# Patient Record
Sex: Male | Born: 1950
Health system: Southern US, Community
[De-identification: ages and names within clinical notes are randomized; demographics above are authoritative.]

## PROBLEM LIST (undated history)

## (undated) DIAGNOSIS — F411 Generalized anxiety disorder: Secondary | ICD-10-CM

## (undated) DIAGNOSIS — N400 Enlarged prostate without lower urinary tract symptoms: Secondary | ICD-10-CM

## (undated) DIAGNOSIS — B1710 Acute hepatitis C without hepatic coma: Secondary | ICD-10-CM

## (undated) DIAGNOSIS — I10 Essential (primary) hypertension: Secondary | ICD-10-CM

## (undated) DIAGNOSIS — F17200 Nicotine dependence, unspecified, uncomplicated: Secondary | ICD-10-CM

## (undated) HISTORY — PX: TONSILLECTOMY: SUR1361

## (undated) HISTORY — DX: Benign prostatic hyperplasia without lower urinary tract symptoms: N40.0

## (undated) HISTORY — DX: Generalized anxiety disorder: F41.1

## (undated) HISTORY — DX: Nicotine dependence, unspecified, uncomplicated: F17.200

## (undated) HISTORY — DX: Acute hepatitis C without hepatic coma: B17.10

## (undated) HISTORY — DX: Essential (primary) hypertension: I10

---

## 2002-12-03 ENCOUNTER — Encounter: Admission: RE | Admit: 2002-12-03 | Discharge: 2003-03-03 | Payer: Self-pay | Admitting: Internal Medicine

## 2005-05-20 ENCOUNTER — Ambulatory Visit: Payer: Self-pay | Admitting: Internal Medicine

## 2005-06-20 ENCOUNTER — Ambulatory Visit: Payer: Self-pay | Admitting: Cardiology

## 2005-08-18 ENCOUNTER — Emergency Department (HOSPITAL_COMMUNITY): Admission: EM | Admit: 2005-08-18 | Discharge: 2005-08-18 | Payer: Self-pay | Admitting: Family Medicine

## 2005-08-25 ENCOUNTER — Ambulatory Visit: Payer: Self-pay | Admitting: Internal Medicine

## 2005-08-26 ENCOUNTER — Encounter: Admission: RE | Admit: 2005-08-26 | Discharge: 2005-08-26 | Payer: Self-pay | Admitting: Internal Medicine

## 2005-09-06 ENCOUNTER — Ambulatory Visit: Payer: Self-pay | Admitting: Internal Medicine

## 2005-11-01 ENCOUNTER — Ambulatory Visit: Payer: Self-pay | Admitting: Family Medicine

## 2007-05-08 ENCOUNTER — Ambulatory Visit: Payer: Self-pay | Admitting: Internal Medicine

## 2007-05-10 DIAGNOSIS — I10 Essential (primary) hypertension: Secondary | ICD-10-CM

## 2007-05-10 DIAGNOSIS — B1920 Unspecified viral hepatitis C without hepatic coma: Secondary | ICD-10-CM

## 2007-05-10 DIAGNOSIS — B1710 Acute hepatitis C without hepatic coma: Secondary | ICD-10-CM

## 2007-05-10 HISTORY — DX: Acute hepatitis C without hepatic coma: B17.10

## 2007-06-08 ENCOUNTER — Ambulatory Visit: Payer: Self-pay | Admitting: Internal Medicine

## 2007-08-29 ENCOUNTER — Telehealth: Payer: Self-pay | Admitting: Internal Medicine

## 2007-09-07 ENCOUNTER — Ambulatory Visit: Payer: Self-pay | Admitting: Internal Medicine

## 2007-09-07 DIAGNOSIS — F17200 Nicotine dependence, unspecified, uncomplicated: Secondary | ICD-10-CM

## 2007-09-07 HISTORY — DX: Nicotine dependence, unspecified, uncomplicated: F17.200

## 2007-09-14 ENCOUNTER — Telehealth: Payer: Self-pay | Admitting: Internal Medicine

## 2008-07-17 ENCOUNTER — Ambulatory Visit: Payer: Self-pay | Admitting: Internal Medicine

## 2008-09-19 ENCOUNTER — Telehealth: Payer: Self-pay | Admitting: Internal Medicine

## 2009-05-07 ENCOUNTER — Telehealth: Payer: Self-pay | Admitting: Internal Medicine

## 2009-06-16 ENCOUNTER — Telehealth: Payer: Self-pay | Admitting: Internal Medicine

## 2009-06-24 ENCOUNTER — Ambulatory Visit: Payer: Self-pay | Admitting: Internal Medicine

## 2010-06-08 ENCOUNTER — Telehealth: Payer: Self-pay | Admitting: Internal Medicine

## 2010-09-08 ENCOUNTER — Telehealth: Payer: Self-pay | Admitting: Internal Medicine

## 2010-09-13 ENCOUNTER — Telehealth: Payer: Self-pay | Admitting: Internal Medicine

## 2010-12-01 ENCOUNTER — Telehealth: Payer: Self-pay | Admitting: Internal Medicine

## 2010-12-28 NOTE — Progress Notes (Signed)
  Phone Note Call from Patient Call back at Home Phone [phone]   Caller: Patient Call For: Birdie Sons MD Summary of Call: [facility])  Pt states he pulled muscles in back this am and needs Flexeril and Vicodin sent to the pharmacy.  He cannot get out of bed.   Initial call taken by: Lynann Beaver CMA,  September 08, 2010 2:38 PM  Follow-up for Phone Call        flexeril 10 mg by mouth three times a day as needed #20/0 hydrocodone 5/325 1 by mouth four times a day as needed pain #20/0 Follow-up by: Birdie Sons MD,  September 09, 2010 4:12 PM    Prescriptions: VICODIN 5-500 MG TABS (HYDROCODONE-ACETAMINOPHEN) one tab every 6 hours as needed for back pain  #10 x 0   Entered by:   Lynann Beaver CMA   Authorized by:   Birdie Sons MD   Signed by:   Lynann Beaver CMA on 09/09/2010   Method used:   Telephoned to ...       [facility] [address] [phone]* (retail)       [address]       [place], [place]  [address]       Ph: [phone]       Fax: [phone]   RxID:   [id number] FLEXERIL 10 MG TABS (CYCLOBENZAPRINE HCL) one tab every 8 hours as needed for back pain  #10 x 0   Entered by:   Lynann Beaver CMA   Authorized by:   Birdie Sons MD   Signed by:   Lynann Beaver CMA on 09/09/2010   Method used:   Telephoned to ...       [facility] [address] [phone]* (retail)       [address]       [place], [place]  [address]       Ph: [phone]       Fax: [phone]   RxID:   [id number]  Notified pt.  Appended Document:  Pharmacy called back about quantity of vicodin.

## 2010-12-28 NOTE — Progress Notes (Signed)
Summary: quantity needed  Phone Note Call from Patient Call back at Home Phone [phone]   Caller: Patient---live call Summary of Call: rx was called in today @ [facility] on [address]. needs quantity on pain med. Drugstore called already.  Rudy Jew, RN  September 13, 2010 3:14 PM  Initial call taken by: Warnell Forester,  September 13, 2010 1:49 PM

## 2010-12-28 NOTE — Progress Notes (Signed)
Summary: lisinopril refill  Phone Note Refill Request Message from:  Fax from Pharmacy on June 08, 2010 5:16 PM  Refills Requested: Medication #1:  LISINOPRIL-HYDROCHLOROTHIAZIDE 20-12.5 MG TABS 2 by mouth at the same time daily Initial call taken by: Kern Reap CMA Duncan Dull),  June 08, 2010 5:16 PM    Prescriptions: LISINOPRIL-HYDROCHLOROTHIAZIDE 20-12.5 MG TABS (LISINOPRIL-HYDROCHLOROTHIAZIDE) 2 by mouth at the same time daily  #180 x 3   Entered by:   Kern Reap CMA (AAMA)   Authorized by:   Birdie Sons MD   Signed by:   Kern Reap CMA (AAMA) on 06/08/2010   Method used:   Electronically to        [facility] [phone]* (retail)       [address]       [place], [place]  [address]       Ph: [phone]       Fax: [phone]   RxID:   [id number]

## 2010-12-30 NOTE — Progress Notes (Signed)
Summary: Pt req antibiotic for URI and Kidney Inf to [facility] [address]  Phone Note Call from Patient Call back at [facility] Phone [phone]   Caller: Patient Summary of Call: Pt called and has upper resp inf and kidney inf. Pt has congestion in chest and also has dark urine. Pt req generic antibiotics called in to [facility] [address]    Initial call taken by: Lucy Antigua,  December 01, 2010 10:34 AM  Follow-up for Phone Call        OV with any provider Follow-up by: Birdie Sons MD,  December 02, 2010 8:21 AM  Additional Follow-up for Phone Call Additional follow up Details #1::        pt prefers Urgent Care. Additional Follow-up by: Lynann Beaver CMA AAMA,  December 02, 2010 11:12 AM

## 2011-04-13 ENCOUNTER — Other Ambulatory Visit: Payer: Self-pay | Admitting: Internal Medicine

## 2011-04-18 ENCOUNTER — Other Ambulatory Visit: Payer: Self-pay | Admitting: Internal Medicine

## 2013-09-10 ENCOUNTER — Encounter: Payer: Self-pay | Admitting: Internal Medicine

## 2013-09-17 ENCOUNTER — Ambulatory Visit: Payer: Self-pay

## 2013-10-10 ENCOUNTER — Ambulatory Visit: Payer: No Typology Code available for payment source | Attending: Internal Medicine | Admitting: Internal Medicine

## 2013-10-10 VITALS — BP 191/116 | HR 66 | Temp 98.2°F | Resp 18 | Ht 71.0 in | Wt 185.8 lb

## 2013-10-10 DIAGNOSIS — I10 Essential (primary) hypertension: Secondary | ICD-10-CM

## 2013-10-10 DIAGNOSIS — M549 Dorsalgia, unspecified: Secondary | ICD-10-CM

## 2013-10-10 DIAGNOSIS — F411 Generalized anxiety disorder: Secondary | ICD-10-CM

## 2013-10-10 DIAGNOSIS — N400 Enlarged prostate without lower urinary tract symptoms: Secondary | ICD-10-CM

## 2013-10-10 DIAGNOSIS — R6882 Decreased libido: Secondary | ICD-10-CM | POA: Insufficient documentation

## 2013-10-10 DIAGNOSIS — G8929 Other chronic pain: Secondary | ICD-10-CM

## 2013-10-10 DIAGNOSIS — B1920 Unspecified viral hepatitis C without hepatic coma: Secondary | ICD-10-CM | POA: Insufficient documentation

## 2013-10-10 HISTORY — DX: Generalized anxiety disorder: F41.1

## 2013-10-10 HISTORY — DX: Benign prostatic hyperplasia without lower urinary tract symptoms: N40.0

## 2013-10-10 LAB — CBC
HCT: 44.4 % (ref 39.0–52.0)
Hemoglobin: 16.1 g/dL (ref 13.0–17.0)
MCH: 32.9 pg (ref 26.0–34.0)
MCHC: 36.3 g/dL — ABNORMAL HIGH (ref 30.0–36.0)
MCV: 90.8 fL (ref 78.0–100.0)
RDW: 12.8 % (ref 11.5–15.5)
WBC: 5.3 10*3/uL (ref 4.0–10.5)

## 2013-10-10 LAB — COMPLETE METABOLIC PANEL WITH GFR
ALT: 93 U/L — ABNORMAL HIGH (ref 0–53)
AST: 55 U/L — ABNORMAL HIGH (ref 0–37)
Albumin: 4.1 g/dL (ref 3.5–5.2)
Alkaline Phosphatase: 79 U/L (ref 39–117)
BUN: 18 mg/dL (ref 6–23)
Creat: 0.9 mg/dL (ref 0.50–1.35)
GFR, Est Non African American: >89 mL/min
Glucose, Bld: 114 mg/dL — ABNORMAL HIGH (ref 70–99)
Potassium: 3.7 mEq/L (ref 3.5–5.3)
Sodium: 137 mEq/L (ref 135–145)
Total Protein: 7.6 g/dL (ref 6.0–8.3)

## 2013-10-10 LAB — PSA: PSA: 0.32 ng/mL (ref <=4.00)

## 2013-10-10 LAB — TSH: TSH: 1.754 u[IU]/mL (ref 0.350–4.500)

## 2013-10-10 MED ORDER — SERTRALINE HCL 25 MG PO TABS: 25.0000 mg | ORAL_TABLET | Freq: Every day | ORAL | Status: DC

## 2013-10-10 MED ORDER — HYDRALAZINE HCL 50 MG PO TABS: 50.0000 mg | ORAL_TABLET | Freq: Three times a day (TID) | ORAL | Status: DC

## 2013-10-10 MED ORDER — TAMSULOSIN HCL 0.4 MG PO CAPS: 0.4000 mg | ORAL_CAPSULE | Freq: Every day | ORAL | Status: DC

## 2013-10-10 MED ORDER — CARVEDILOL 6.25 MG PO TABS: 6.2500 mg | ORAL_TABLET | Freq: Two times a day (BID) | ORAL | Status: DC

## 2013-10-10 NOTE — Progress Notes (Signed)
Patient here to establish care History of HTN has not been on his medicine since last year

## 2013-10-10 NOTE — Progress Notes (Signed)
Patient ID: James Avila, male   DOB: 27-May-1951, 62 y.o.   MRN: [id number]   Patient Demographics  [name], is a 62 y.o. male  CSN: [id number]  MRN: [id number]  DOB - 07-26-1951  Outpatient Primary MD for the patient is Judie Petit, MD   With History of -  Past Medical History  Diagnosis Date  . Hypertension   . TOBACCO USE 09/07/2007    Qualifier: Diagnosis of  By: Cato Mulligan MD, Bruce    . HEPATITIS C 05/10/2007    Qualifier: Diagnosis of  By: Cato Mulligan MD, Bruce    . BPH (benign prostatic hyperplasia) 10/10/2013  . Anxiety state, unspecified 10/10/2013      Past Surgical History  Procedure Laterality Date  . Tonsillectomy      in for   Chief Complaint  Patient presents with  . Annual Exam  . Hypertension     HPI  [name]  is a 62 y.o. male, with history of hypertension, hepatitis C, chronic low back pain, or back abuse, some generalized anxiety and intermittent basis comes here to establish care, he currently has no subjective complaints except low urinary stream, chronic low intermittent back pain without radiation to lower extremities, he does have some tingling and numbness from time to time in his feet, some reduction in his Libido over the last few years, denies any chest or abdominal pain, no shortness of breath, no dysuria, no shortness of breath. No focal weakness. He's not suicidal or homicidal. He takes care of his elderly mother at home and usually stays quite busy, he does agree to smoking 6-7 cigarettes per day however he does not indulge in regular alcohol intake.    Review of Systems    In addition to the HPI above,   No Fever-chills, No Headache, No changes with Vision or hearing, No problems swallowing food or Liquids, No Chest pain, Cough or Shortness of Breath, No Abdominal pain, No Nausea or Vommitting, Bowel movements are regular, No Blood in stool or Urine, but decreased urinary stream No dysuria, No new skin rashes or  bruises, No new joints pains-aches, chronic intermittent low back pain No new weakness, tingling, numbness in any extremity, except some intermittent tingling and numbness in his feet. No recent weight gain or loss, No polyuria, polydypsia or polyphagia, No significant Mental Stressors. But some generalized anxiety from time to time  A full 10 point Review of Systems was done, except as stated above, all other Review of Systems were negative.   Social History History  Substance Use Topics  . Smoking status: Light Tobacco Smoker    Types: Cigarettes  . Smokeless tobacco: Not on file  . Alcohol Use: Yes      Family History Hypertension in mother  Prior to Admission medications   Medication Sig Start Date End Date Taking? Authorizing Provider  aspirin 81 MG tablet Take 81 mg by mouth daily.   Yes Historical Provider, MD  carvedilol (COREG) 6.25 MG tablet Take 1 tablet (6.25 mg total) by mouth 2 (two) times daily with a meal. 10/10/13   Leroy Sea, MD  hydrALAZINE (APRESOLINE) 50 MG tablet Take 1 tablet (50 mg total) by mouth 3 (three) times daily. 10/10/13   Leroy Sea, MD  sertraline (ZOLOFT) 25 MG tablet Take 1 tablet (25 mg total) by mouth daily. 10/10/13   Leroy Sea, MD  tamsulosin (FLOMAX) 0.4 MG CAPS capsule Take 1 capsule (0.4 mg total) by mouth daily. 10/10/13  Leroy Sea, MD    No Known Allergies  Physical Exam  Vitals  Blood pressure 191/116, pulse 66, temperature 98.2 F (36.8 C), resp. rate 18, height 5\' 11"  (1.803 m), weight 185 lb 12.8 oz (84.278 kg), SpO2 100.00%.   1. General pleasant middle-aged Caucasian male sitting on clinic examination table in no apparent distress,     2. Normal affect and insight, Not Suicidal or Homicidal, Awake Alert, Oriented X 3.  3. No F.N deficits, ALL C.Nerves Intact, Strength 5/5 all 4 extremities, Sensation intact all 4 extremities, Plantars down going.  4. Ears and Eyes appear Normal, Conjunctivae  clear, PERRLA. Moist Oral Mucosa.  5. Supple Neck, No JVD, No cervical lymphadenopathy appriciated, No Carotid Bruits.  6. Symmetrical Chest wall movement, Good air movement bilaterally, CTAB.  7. RRR, No Gallops, Rubs or Murmurs, No Parasternal Heave.  8. Positive Bowel Sounds, Abdomen Soft, Non tender, No organomegaly appriciated,No rebound -guarding or rigidity.  9.  No Cyanosis, Normal Skin Turgor, No Skin Rash or Bruise.  10. Good muscle tone,  joints appear normal , no effusions, Normal ROM.  11. No Palpable Lymph Nodes in Neck or Axillae     Data Review  No results found for this basename: WBC,  HGB,  HCT,  MCV,  PLT      Chemistry   No results found for this basename: NA,  K,  CL,  CO2,  BUN,  CREATININE,  GLU   No results found for this basename: CALCIUM,  ALKPHOS,  AST,  ALT,  BILITOT       No results found for this basename: HGBA1C    No results found for this basename: CHOL,  HDL,  LDLCALC,  LDLDIRECT,  TRIG,  CHOLHDL    No results found for this basename: TSH    No results found for this basename: PSA         Assessment and plan   Hypertension. Started on Coreg along with hydralazine, come back in 2 months for repeat blood pressure.     History of hepatitis C. Stable no acute issues, check CMP referred to GI for routine followup and monitoring.    BPH. Start on Flomax, check PSA, urology referral     Generalized anxiety. Stable not suicidal homicidal, start on low-dose Zoloft.     Chronic low back pain stable no acute issues. Currently pain-free.     History of smoking. Counseled to quit     Some lower extremity paresthesias. We'll check A1c,TSH and B12 level.     Decreased Libido - will check free and total Testosterone.      Routine health maintenance.  Screening labs. CBC, CMP, TSH, A1c, PSA ordered    GI referral made for surveillance colonoscopy     Leroy Sea M.D on 10/10/2013 at 3:55 PM

## 2013-10-11 LAB — TESTOSTERONE, FREE, TOTAL, SHBG
Sex Hormone Binding: 74 nmol/L — ABNORMAL HIGH (ref 13–71)
Testosterone, Free: 39.8 pg/mL — ABNORMAL LOW (ref 47.0–244.0)
Testosterone-% Free: 1.1 % — ABNORMAL LOW (ref 1.6–2.9)

## 2013-10-11 LAB — VITAMIN B12: Vitamin B-12: 477 pg/mL (ref 211–911)

## 2013-10-14 ENCOUNTER — Telehealth: Payer: Self-pay | Admitting: Emergency Medicine

## 2013-10-14 NOTE — Telephone Encounter (Signed)
Left message for pt to call clinic

## 2013-10-14 NOTE — Addendum Note (Signed)
Addended by: Leroy Sea on: 10/14/2013 10:09 AM   Modules accepted: Orders

## 2013-10-14 NOTE — Progress Notes (Signed)
Quick Note:  Patient being referred to endocrine for possible low testosterone please let patient know ______

## 2013-10-14 NOTE — Telephone Encounter (Signed)
Message copied by Darlis Loan on Mon Oct 14, 2013  1:25 PM ------      Message from: [facility] [place] [facility]      Created: Mon Oct 14, 2013 10:10 AM       Patient being referred to endocrine for possible low testosterone please let patient know ------

## 2013-11-13 ENCOUNTER — Encounter: Payer: Self-pay | Admitting: Internal Medicine

## 2014-01-23 ENCOUNTER — Encounter: Payer: Self-pay | Admitting: *Deleted

## 2014-01-28 ENCOUNTER — Ambulatory Visit: Payer: No Typology Code available for payment source | Attending: Internal Medicine

## 2014-01-30 ENCOUNTER — Encounter: Payer: Self-pay | Admitting: *Deleted

## 2014-02-25 ENCOUNTER — Ambulatory Visit: Payer: No Typology Code available for payment source | Attending: Internal Medicine | Admitting: Internal Medicine

## 2014-02-25 ENCOUNTER — Encounter: Payer: Self-pay | Admitting: Internal Medicine

## 2014-02-25 VITALS — BP 170/100 | HR 71 | Temp 97.8°F | Resp 16 | Wt 186.0 lb

## 2014-02-25 DIAGNOSIS — I10 Essential (primary) hypertension: Secondary | ICD-10-CM | POA: Insufficient documentation

## 2014-02-25 DIAGNOSIS — Z79899 Other long term (current) drug therapy: Secondary | ICD-10-CM | POA: Insufficient documentation

## 2014-02-25 DIAGNOSIS — N400 Enlarged prostate without lower urinary tract symptoms: Secondary | ICD-10-CM

## 2014-02-25 DIAGNOSIS — F411 Generalized anxiety disorder: Secondary | ICD-10-CM | POA: Insufficient documentation

## 2014-02-25 DIAGNOSIS — R7989 Other specified abnormal findings of blood chemistry: Secondary | ICD-10-CM

## 2014-02-25 DIAGNOSIS — F17200 Nicotine dependence, unspecified, uncomplicated: Secondary | ICD-10-CM | POA: Insufficient documentation

## 2014-02-25 DIAGNOSIS — R030 Elevated blood-pressure reading, without diagnosis of hypertension: Secondary | ICD-10-CM

## 2014-02-25 DIAGNOSIS — Z8619 Personal history of other infectious and parasitic diseases: Secondary | ICD-10-CM

## 2014-02-25 DIAGNOSIS — Z7982 Long term (current) use of aspirin: Secondary | ICD-10-CM | POA: Insufficient documentation

## 2014-02-25 DIAGNOSIS — E291 Testicular hypofunction: Secondary | ICD-10-CM

## 2014-02-25 DIAGNOSIS — IMO0001 Reserved for inherently not codable concepts without codable children: Secondary | ICD-10-CM

## 2014-02-25 DIAGNOSIS — B1920 Unspecified viral hepatitis C without hepatic coma: Secondary | ICD-10-CM | POA: Insufficient documentation

## 2014-02-25 DIAGNOSIS — R109 Unspecified abdominal pain: Secondary | ICD-10-CM

## 2014-02-25 LAB — POCT URINALYSIS DIPSTICK
BILIRUBIN UA: NEGATIVE
GLUCOSE UA: NEGATIVE
Ketones, UA: NEGATIVE
Leukocytes, UA: NEGATIVE
Nitrite, UA: NEGATIVE
RBC UA: NEGATIVE
SPEC GRAV UA: 1.025
UROBILINOGEN UA: 1
pH, UA: 8

## 2014-02-25 MED ORDER — CARVEDILOL 6.25 MG PO TABS: 6.2500 mg | ORAL_TABLET | Freq: Two times a day (BID) | ORAL | Status: DC

## 2014-02-25 MED ORDER — HYDRALAZINE HCL 50 MG PO TABS: 50.0000 mg | ORAL_TABLET | Freq: Four times a day (QID) | ORAL | Status: DC

## 2014-02-25 MED ORDER — TAMSULOSIN HCL 0.4 MG PO CAPS: 0.4000 mg | ORAL_CAPSULE | Freq: Every day | ORAL | Status: DC

## 2014-02-25 MED ORDER — CLONIDINE HCL 0.1 MG PO TABS
0.2000 mg | ORAL_TABLET | Freq: Once | ORAL | Status: AC
  Administered 2014-02-25: 0.2 mg via ORAL

## 2014-02-25 MED ORDER — SERTRALINE HCL 25 MG PO TABS: 25.0000 mg | ORAL_TABLET | Freq: Every day | ORAL | Status: DC

## 2014-02-25 NOTE — Progress Notes (Signed)
MRN: [id number] Name: James Avila  Sex: male Age: 63 y.o. DOB: Jan 20, 1951  Allergies: Review of patient's allergies indicates no known allergies.  Chief Complaint  Patient presents with  . Follow-up    HPI: Patient is 63 y.o. male who has history of hypertension, BPH, anxiety, hep C as per patient he has never been treated for hep C today's blood pressure was elevated and was given clonidine his repeat blood pressure is 170/100 patient denies any headache dizziness chest and shortness of breath, patient does smoke cigarettes and is not ready to quit yet. Patient has been only taking Coreg was prescribed hydralazine in the past as well. As per patient he has not been taking Flomax recently but did have more frequent urination recently denies any dysuria.  Past Medical History  Diagnosis Date  . Hypertension   . TOBACCO USE 09/07/2007    Qualifier: Diagnosis of  By: Leanne Chang MD, Bruce    . HEPATITIS C 05/10/2007    Qualifier: Diagnosis of  By: Leanne Chang MD, Bruce    . BPH (benign prostatic hyperplasia) 10/10/2013  . Anxiety state, unspecified 10/10/2013    Past Surgical History  Procedure Laterality Date  . Tonsillectomy        Medication List       This list is accurate as of: 02/25/14  2:38 PM.  Always use your most recent med list.               aspirin 81 MG tablet  Take 81 mg by mouth daily.     carvedilol 6.25 MG tablet  Commonly known as:  COREG  Take 1 tablet (6.25 mg total) by mouth 2 (two) times daily with a meal.     hydrALAZINE 50 MG tablet  Commonly known as:  APRESOLINE  Take 1 tablet (50 mg total) by mouth 4 (four) times daily.     sertraline 25 MG tablet  Commonly known as:  ZOLOFT  Take 1 tablet (25 mg total) by mouth daily.     tamsulosin 0.4 MG Caps capsule  Commonly known as:  FLOMAX  Take 1 capsule (0.4 mg total) by mouth daily.        Meds ordered this encounter  Medications  . cloNIDine (CATAPRES) tablet 0.2 mg    Sig:   .  tamsulosin (FLOMAX) 0.4 MG CAPS capsule    Sig: Take 1 capsule (0.4 mg total) by mouth daily.    Dispense:  30 capsule    Refill:  3  . hydrALAZINE (APRESOLINE) 50 MG tablet    Sig: Take 1 tablet (50 mg total) by mouth 4 (four) times daily.    Dispense:  120 tablet    Refill:  3  . carvedilol (COREG) 6.25 MG tablet    Sig: Take 1 tablet (6.25 mg total) by mouth 2 (two) times daily with a meal.    Dispense:  60 tablet    Refill:  3  . sertraline (ZOLOFT) 25 MG tablet    Sig: Take 1 tablet (25 mg total) by mouth daily.    Dispense:  30 tablet    Refill:  3     There is no immunization history on file for this patient.  History reviewed. No pertinent family history.  History  Substance Use Topics  . Smoking status: Light Tobacco Smoker    Types: Cigarettes  . Smokeless tobacco: Not on file  . Alcohol Use: Yes    Review of Systems   As  noted in HPI  Filed Vitals:   02/25/14 1432  BP: 170/100  Pulse:   Temp:   Resp:     Physical Exam  Physical Exam  Constitutional: No distress.  Eyes: EOM are normal. Pupils are equal, round, and reactive to light.  Cardiovascular: Normal rate and regular rhythm.   Pulmonary/Chest: Breath sounds normal. No respiratory distress. He has no wheezes. He has no rales.  Musculoskeletal: He exhibits no edema.  Neurological:  Equal strength all extremities     CBC    Component Value Date/Time   WBC 5.3 10/10/2013 1551   RBC 4.89 10/10/2013 1551   HGB 16.1 10/10/2013 1551   HCT 44.4 10/10/2013 1551   PLT 186 10/10/2013 1551   MCV 90.8 10/10/2013 1551    CMP     Component Value Date/Time   NA 137 10/10/2013 1551   K 3.7 10/10/2013 1551   CL 104 10/10/2013 1551   CO2 25 10/10/2013 1551   GLUCOSE 114* 10/10/2013 1551   BUN 18 10/10/2013 1551   CREATININE 0.90 10/10/2013 1551   CALCIUM 9.0 10/10/2013 1551   PROT 7.6 10/10/2013 1551   ALBUMIN 4.1 10/10/2013 1551   AST 55* 10/10/2013 1551   ALT 93* 10/10/2013 1551    ALKPHOS 79 10/10/2013 1551   BILITOT 0.9 10/10/2013 1551   GFRNONAA >89 10/10/2013 1551   GFRAA >89 10/10/2013 1551    No results found for this basename: chol, tri, ldl    No components found with this basename: hga1c    Lab Results  Component Value Date/Time   AST 55* 10/10/2013  3:51 PM    Assessment and Plan  HTN (hypertension) - Plan: Advised patient for DASH diet, continue with Coreg her resume back on hydralazine hydrALAZINE (APRESOLINE) 50 MG tablet, carvedilol (COREG) 6.25 MG tablet  Flank pain - Plan: Urinalysis Dipstick Results for orders placed in visit on 02/25/14  POCT URINALYSIS DIPSTICK      Result Value Ref Range   Color, UA yellow     Clarity, UA clear     Glucose, UA neg     Bilirubin, UA neg     Ketones, UA neg     Spec Grav, UA 1.025     Blood, UA neg     pH, UA 8.0     Protein, UA trace     Urobilinogen, UA 1.0     Nitrite, UA neg     Leukocytes, UA Negative     Negative for infection, patient will resume back on Flomax  Elevated blood pressure - Plan: cloNIDine (CATAPRES) tablet 0.2 mg, EKG 12-Lead, nonspecific T wave changes patient denies chest pain. Needs better blood pressure control.  BPH (benign prostatic hyperplasia) - Plan: tamsulosin (FLOMAX) 0.4 MG CAPS capsule  TOBACCO USE Advised patient to quit smoking  History of hepatitis C - Plan: Ambulatory referral to Infectious Disease  Anxiety state, unspecified - Plan: sertraline (ZOLOFT) 25 MG tablet  Low testosterone - Plan: Ambulatory referral to Endocrinology   Return in about 3 months (around 05/27/2014) for hypertension, BP check in 2 weeks/Nurse Visit.  Lorayne Marek, MD

## 2014-02-25 NOTE — Progress Notes (Signed)
Patient here for follow up-htn Presents in office with elevated blood pressure Complains of sleeping more Flank back pain

## 2014-02-25 NOTE — Patient Instructions (Signed)
DASH Diet  The DASH diet stands for "Dietary Approaches to Stop Hypertension." It is a healthy eating plan that has been shown to reduce high blood pressure (hypertension) in as little as 14 days, while also possibly providing other significant health benefits. These other health benefits include reducing the risk of breast cancer after menopause and reducing the risk of type 2 diabetes, heart disease, colon cancer, and stroke. Health benefits also include weight loss and slowing kidney failure in patients with chronic kidney disease.   DIET GUIDELINES  · Limit salt (sodium). Your diet should contain less than 1500 mg of sodium daily.  · Limit refined or processed carbohydrates. Your diet should include mostly whole grains. Desserts and added sugars should be used sparingly.  · Include small amounts of heart-healthy fats. These types of fats include nuts, oils, and tub margarine. Limit saturated and trans fats. These fats have been shown to be harmful in the body.  CHOOSING FOODS   The following food groups are based on a 2000 calorie diet. See your Registered Dietitian for individual calorie needs.  Grains and Grain Products (6 to 8 servings daily)  · Eat More Often: Whole-wheat bread, brown rice, whole-grain or wheat pasta, quinoa, popcorn without added fat or salt (air popped).  · Eat Less Often: White bread, white pasta, white rice, cornbread.  Vegetables (4 to 5 servings daily)  · Eat More Often: Fresh, frozen, and canned vegetables. Vegetables may be raw, steamed, roasted, or grilled with a minimal amount of fat.  · Eat Less Often/Avoid: Creamed or fried vegetables. Vegetables in a cheese sauce.  Fruit (4 to 5 servings daily)  · Eat More Often: All fresh, canned (in natural juice), or frozen fruits. Dried fruits without added sugar. One hundred percent fruit juice (½ cup [237 mL] daily).  · Eat Less Often: Dried fruits with added sugar. Canned fruit in light or heavy syrup.  Lean Meats, Fish, and Poultry (2  servings or less daily. One serving is 3 to 4 oz [85-114 g]).  · Eat More Often: Ninety percent or leaner ground beef, tenderloin, sirloin. Round cuts of beef, chicken breast, turkey breast. All fish. Grill, bake, or broil your meat. Nothing should be fried.  · Eat Less Often/Avoid: Fatty cuts of meat, turkey, or chicken leg, thigh, or wing. Fried cuts of meat or fish.  Dairy (2 to 3 servings)  · Eat More Often: Low-fat or fat-free milk, low-fat plain or light yogurt, reduced-fat or part-skim cheese.  · Eat Less Often/Avoid: Milk (whole, 2%). Whole milk yogurt. Full-fat cheeses.  Nuts, Seeds, and Legumes (4 to 5 servings per week)  · Eat More Often: All without added salt.  · Eat Less Often/Avoid: Salted nuts and seeds, canned beans with added salt.  Fats and Sweets (limited)  · Eat More Often: Vegetable oils, tub margarines without trans fats, sugar-free gelatin. Mayonnaise and salad dressings.  · Eat Less Often/Avoid: Coconut oils, palm oils, butter, stick margarine, cream, half and half, cookies, candy, pie.  FOR MORE INFORMATION  The Dash Diet Eating Plan: www.dashdiet.org  Document Released: 11/03/2011 Document Revised: 02/06/2012 Document Reviewed: 11/03/2011  [employer]® Patient Information ©2014 [employer].

## 2014-02-28 ENCOUNTER — Ambulatory Visit: Payer: No Typology Code available for payment source | Attending: Internal Medicine

## 2014-03-07 ENCOUNTER — Ambulatory Visit: Payer: No Typology Code available for payment source | Attending: Internal Medicine | Admitting: Internal Medicine

## 2014-03-07 VITALS — BP 178/92 | HR 78 | Temp 98.6°F | Resp 16 | Ht 71.0 in | Wt 187.0 lb

## 2014-03-07 DIAGNOSIS — I10 Essential (primary) hypertension: Secondary | ICD-10-CM

## 2014-03-07 DIAGNOSIS — F17200 Nicotine dependence, unspecified, uncomplicated: Secondary | ICD-10-CM

## 2014-03-07 MED ORDER — CLONIDINE HCL 0.1 MG PO TABS
0.1000 mg | ORAL_TABLET | Freq: Once | ORAL | Status: AC
  Administered 2014-03-07: 0.1 mg via ORAL

## 2014-03-07 MED ORDER — HYDRALAZINE HCL 50 MG PO TABS: 100.0000 mg | ORAL_TABLET | Freq: Three times a day (TID) | ORAL | Status: DC

## 2014-03-07 MED ORDER — NAPROXEN SODIUM 220 MG PO TABS
220.0000 mg | ORAL_TABLET | Freq: Once | ORAL | Status: AC
  Administered 2014-03-07: 220 mg via ORAL

## 2014-03-07 NOTE — Progress Notes (Signed)
Pt is here because his BP has been running high w/ symptoms of high BP. Today BP 178/105

## 2014-03-07 NOTE — Patient Instructions (Signed)
Smoking Cessation Quitting smoking is important to your health and has many advantages. However, it is not always easy to quit since nicotine is a very addictive drug. Often times, people try 3 times or more before being able to quit. This document explains the best ways for you to prepare to quit smoking. Quitting takes hard work and a lot of effort, but you can do it. ADVANTAGES OF QUITTING SMOKING  You will live longer, feel better, and live better.  Your body will feel the impact of quitting smoking almost immediately.  Within 20 minutes, blood pressure decreases. Your pulse returns to its normal level.  After 8 hours, carbon monoxide levels in the blood return to normal. Your oxygen level increases.  After 24 hours, the chance of having a heart attack starts to decrease. Your breath, hair, and body stop smelling like smoke.  After 48 hours, damaged nerve endings begin to recover. Your sense of taste and smell improve.  After 72 hours, the body is virtually free of nicotine. Your bronchial tubes relax and breathing becomes easier.  After 2 to 12 weeks, lungs can hold more air. Exercise becomes easier and circulation improves.  The risk of having a heart attack, stroke, cancer, or lung disease is greatly reduced.  After 1 year, the risk of coronary heart disease is cut in half.  After 5 years, the risk of stroke falls to the same as a nonsmoker.  After 10 years, the risk of lung cancer is cut in half and the risk of other cancers decreases significantly.  After 15 years, the risk of coronary heart disease drops, usually to the level of a nonsmoker.  If you are pregnant, quitting smoking will improve your chances of having a healthy baby.  The people you live with, especially any children, will be healthier.  You will have extra money to spend on things other than cigarettes. QUESTIONS TO THINK ABOUT BEFORE ATTEMPTING TO QUIT You may want to talk about your answers with your  caregiver.  Why do you want to quit?  If you tried to quit in the past, what helped and what did not?  What will be the most difficult situations for you after you quit? How will you plan to handle them?  Who can help you through the tough times? Your family? Friends? A caregiver?  What pleasures do you get from smoking? What ways can you still get pleasure if you quit? Here are some questions to ask your caregiver:  How can you help me to be successful at quitting?  What medicine do you think would be best for me and how should I take it?  What should I do if I need more help?  What is smoking withdrawal like? How can I get information on withdrawal? GET READY  Set a quit date.  Change your environment by getting rid of all cigarettes, ashtrays, matches, and lighters in your home, car, or work. Do not let people smoke in your home.  Review your past attempts to quit. Think about what worked and what did not. GET SUPPORT AND ENCOURAGEMENT You have a better chance of being successful if you have help. You can get support in many ways.  Tell your family, friends, and co-workers that you are going to quit and need their support. Ask them not to smoke around you.  Get individual, group, or telephone counseling and support. Programs are available at local hospitals and health centers. Call your local health department for   information about programs in your area.  Spiritual beliefs and practices may help some smokers quit.  Download a "quit meter" on your computer to keep track of quit statistics, such as how long you have gone without smoking, cigarettes not smoked, and money saved.  Get a self-help book about quitting smoking and staying off of tobacco. LEARN NEW SKILLS AND BEHAVIORS  Distract yourself from urges to smoke. Talk to someone, go for a walk, or occupy your time with a task.  Change your normal routine. Take a different route to work. Drink tea instead of coffee.  Eat breakfast in a different place.  Reduce your stress. Take a hot bath, exercise, or read a book.  Plan something enjoyable to do every day. Reward yourself for not smoking.  Explore interactive web-based programs that specialize in helping you quit. GET MEDICINE AND USE IT CORRECTLY Medicines can help you stop smoking and decrease the urge to smoke. Combining medicine with the above behavioral methods and support can greatly increase your chances of successfully quitting smoking.  Nicotine replacement therapy helps deliver nicotine to your body without the negative effects and risks of smoking. Nicotine replacement therapy includes nicotine gum, lozenges, inhalers, nasal sprays, and skin patches. Some may be available over-the-counter and others require a prescription.  Antidepressant medicine helps people abstain from smoking, but how this works is unknown. This medicine is available by prescription.  Nicotinic receptor partial agonist medicine simulates the effect of nicotine in your brain. This medicine is available by prescription. Ask your caregiver for advice about which medicines to use and how to use them based on your health history. Your caregiver will tell you what side effects to look out for if you choose to be on a medicine or therapy. Carefully read the information on the package. Do not use any other product containing nicotine while using a nicotine replacement product.  RELAPSE OR DIFFICULT SITUATIONS Most relapses occur within the first 3 months after quitting. Do not be discouraged if you start smoking again. Remember, most people try several times before finally quitting. You may have symptoms of withdrawal because your body is used to nicotine. You may crave cigarettes, be irritable, feel very hungry, cough often, get headaches, or have difficulty concentrating. The withdrawal symptoms are only temporary. They are strongest when you first quit, but they will go away within  10 14 days. To reduce the chances of relapse, try to:  Avoid drinking alcohol. Drinking lowers your chances of successfully quitting.  Reduce the amount of caffeine you consume. Once you quit smoking, the amount of caffeine in your body increases and can give you symptoms, such as a rapid heartbeat, sweating, and anxiety.  Avoid smokers because they can make you want to smoke.  Do not let weight gain distract you. Many smokers will gain weight when they quit, usually less than 10 pounds. Eat a healthy diet and stay active. You can always lose the weight gained after you quit.  Find ways to improve your mood other than smoking. FOR MORE INFORMATION  www.smokefree.gov  Document Released: 11/08/2001 Document Revised: 05/15/2012 Document Reviewed: 02/23/2012 Exit[employer] Patient Information 2014 [employer]. Hypertension As your heart beats, it forces blood through your arteries. This force is your blood pressure. If the pressure is too high, it is called hypertension (HTN) or high blood pressure. HTN is dangerous because you may have it and not know it. High blood pressure may mean that your heart has to work harder to pump   blood. Your arteries may be narrow or stiff. The extra work puts you at risk for heart disease, stroke, and other problems.  Blood pressure consists of two numbers, a higher number over a lower, 110/72, for example. It is stated as "110 over 72." The ideal is below 120 for the top number (systolic) and under 80 for the bottom (diastolic). Write down your blood pressure today. You should pay close attention to your blood pressure if you have certain conditions such as:  Heart failure.  Prior heart attack.  Diabetes  Chronic kidney disease.  Prior stroke.  Multiple risk factors for heart disease. To see if you have HTN, your blood pressure should be measured while you are seated with your arm held at the level of the heart. It should be measured at least twice. A  one-time elevated blood pressure reading (especially in the Emergency Department) does not mean that you need treatment. There may be conditions in which the blood pressure is different between your right and left arms. It is important to see your caregiver soon for a recheck. Most people have essential hypertension which means that there is not a specific cause. This type of high blood pressure may be lowered by changing lifestyle factors such as:  Stress.  Smoking.  Lack of exercise.  Excessive weight.  Drug/tobacco/alcohol use.  Eating less salt. Most people do not have symptoms from high blood pressure until it has caused damage to the body. Effective treatment can often prevent, delay or reduce that damage. TREATMENT  When a cause has been identified, treatment for high blood pressure is directed at the cause. There are a large number of medications to treat HTN. These fall into several categories, and your caregiver will help you select the medicines that are best for you. Medications may have side effects. You should review side effects with your caregiver. If your blood pressure stays high after you have made lifestyle changes or started on medicines,   Your medication(s) may need to be changed.  Other problems may need to be addressed.  Be certain you understand your prescriptions, and know how and when to take your medicine.  Be sure to follow up with your caregiver within the time frame advised (usually within two weeks) to have your blood pressure rechecked and to review your medications.  If you are taking more than one medicine to lower your blood pressure, make sure you know how and at what times they should be taken. Taking two medicines at the same time can result in blood pressure that is too low. SEEK IMMEDIATE MEDICAL CARE IF:  You develop a severe headache, blurred or changing vision, or confusion.  You have unusual weakness or numbness, or a faint feeling.  You  have severe chest or abdominal pain, vomiting, or breathing problems. MAKE SURE YOU:   Understand these instructions.  Will watch your condition.  Will get help right away if you are not doing well or get worse. Document Released: 11/14/2005 Document Revised: 02/06/2012 Document Reviewed: 07/04/2008 ExitCare Patient Information 2014 [employer].  

## 2014-03-07 NOTE — Progress Notes (Signed)
Patient ID: James Avila, male   DOB: May 07, 1951, 63 y.o.   MRN: [id number]   James Avila, is a 63 y.o. male  FYB:[id number]  NID:[id number]  DOB - Dec 02, 1950  CC:  Follow up/BP check     HPI: James Avila is a 63 y.o. male that present today for a BP check.  Patient's BP upon arrival was 178/113.  Patient states yesterday he had a episode of dizziness, weakness, and diaphoresis while shopping.  Patient states that he sat down until the symptoms subsided and checked his BP which was 195/113.  Patient reports medication compliance. Patient denied chest pain,SOB, numbness/tingling, cough, or palpitations.  He does report a throbbing pain behind his eyes that he rates as a 8/10 in pain.   No Known Allergies Past Medical History  Diagnosis Date  . Hypertension   . TOBACCO USE 09/07/2007    Qualifier: Diagnosis of  By: Leanne Chang MD, Bruce    . HEPATITIS C 05/10/2007    Qualifier: Diagnosis of  By: Leanne Chang MD, Bruce    . BPH (benign prostatic hyperplasia) 10/10/2013  . Anxiety state, unspecified 10/10/2013   Current Outpatient Prescriptions on File Prior to Visit  Medication Sig Dispense Refill  . aspirin 81 MG tablet Take 81 mg by mouth daily.      . sertraline (ZOLOFT) 25 MG tablet Take 1 tablet (25 mg total) by mouth daily.  30 tablet  3  . tamsulosin (FLOMAX) 0.4 MG CAPS capsule Take 1 capsule (0.4 mg total) by mouth daily.  30 capsule  3  . carvedilol (COREG) 6.25 MG tablet Take 1 tablet (6.25 mg total) by mouth 2 (two) times daily with a meal.  60 tablet  3   No current facility-administered medications on file prior to visit.   History reviewed. No pertinent family history. History   Social History  . Marital Status: Married    Spouse Name: N/A    Number of Children: N/A  . Years of Education: N/A   Occupational History  . Not on file.   Social History Main Topics  . Smoking status: Light Tobacco Smoker    Types: Cigarettes  . Smokeless tobacco: Not on file  . Alcohol  Use: Yes  . Drug Use: Not on file  . Sexual Activity: Not on file   Other Topics Concern  . Not on file   Social History Narrative  . No narrative on file    Review of Systems: Constitutional: Negative for fever, chills, diaphoresis, activity change, appetite change and fatigue. HENT: Reports occasional nosebleeds. Negative for congestion, facial swelling, rhinorrhea, neck pain, neck stiffness. Eyes: Reports throbbing pain behind eyes. Negative for visual disturbance. Respiratory: Negative for cough, choking, chest tightness, shortness of breath, wheezing and stridor.  Cardiovascular: Negative for chest pain, palpitations and leg swelling. Gastrointestinal: Negative for abdominal distention.  Musculoskeletal: Negative for back pain, joint swelling, arthralgia and gait problem. Neurological: Negative for dizziness, tremors, seizures, syncope, facial asymmetry, speech difficulty, weakness, light-headedness, numbness and headaches.  Psychiatric/Behavioral: Negative for  confusion,decreased concentration and agitation.    Objective:   Filed Vitals:   03/07/14 1504  BP: 178/92  Pulse:   Temp:   Resp:     Physical Exam: Constitutional: Patient appears well-developed and well-nourished. No distress.  Eyes: Conjunctivae and EOM are normal. PERRLA, no scleral icterus. Neck: Normal ROM. Neck supple. No JVD. No tracheal deviation. No thyromegaly.  CVS: RRR, S1/S2 +, no murmurs, no gallops, no carotid bruit.  Pulmonary: Effort and  breath sounds normal, no stridor, rhonchi, wheezes, rales.  Abdominal: Soft. BS +, no distension, tenderness, rebound or guarding.  Musculoskeletal: Normal range of motion. No edema and no tenderness.  Skin: Skin is warm and dry. No rash noted. Not diaphoretic. No erythema. No pallor. Psychiatric: Normal mood and affect. Behavior, judgment, thought content normal.  Lab Results  Component Value Date   WBC 5.3 10/10/2013   HGB 16.1 10/10/2013   HCT 44.4  10/10/2013   MCV 90.8 10/10/2013   PLT 186 10/10/2013   Lab Results  Component Value Date   CREATININE 0.90 10/10/2013   BUN 18 10/10/2013   NA 137 10/10/2013   K 3.7 10/10/2013   CL 104 10/10/2013   CO2 25 10/10/2013    Lab Results  Component Value Date   HGBA1C 5.4 10/10/2013   Lipid Panel  No results found for this basename: chol, trig, hdl, cholhdl, vldl, ldlcalc       Assessment and plan:   1. HTN (hypertension)  - cloNIDine (CATAPRES) tablet 0.1 mg; Take 1 tablet (0.1 mg total) by mouth once. - naproxen sodium (ANAPROX) tablet 220 mg; Take 1 tablet (220 mg total) by mouth once. -  Changed to hydrALAZINE (APRESOLINE) 50 MG tablet; Take 2 tablets (100 mg total) by mouth 3 (three) times daily.  Dispense: 120 tablet; Refill: 3  2. TOBACCO USE Patient given information to quit line and resources for free nicotine patches.  Counseled to develop a plan to quit smoking and adhere to the plan.     Patient instructed to follow up in one week for a BP check.  The patient was given clear instructions to go to ER or return to medical center if symptoms don't improve, worsen or new problems develop. The patient verbalized understanding.

## 2014-03-10 ENCOUNTER — Ambulatory Visit: Payer: No Typology Code available for payment source | Attending: Internal Medicine

## 2014-03-10 ENCOUNTER — Emergency Department (HOSPITAL_COMMUNITY): Payer: No Typology Code available for payment source

## 2014-03-10 ENCOUNTER — Emergency Department (HOSPITAL_COMMUNITY)
Admission: EM | Admit: 2014-03-10 | Discharge: 2014-03-10 | Disposition: A | Discharge status: Home / Self Care | Payer: No Typology Code available for payment source | Attending: Emergency Medicine | Admitting: Emergency Medicine

## 2014-03-10 ENCOUNTER — Encounter (HOSPITAL_COMMUNITY): Payer: Self-pay | Admitting: Emergency Medicine

## 2014-03-10 VITALS — BP 194/106 | HR 97 | Resp 16

## 2014-03-10 DIAGNOSIS — R209 Unspecified disturbances of skin sensation: Secondary | ICD-10-CM | POA: Insufficient documentation

## 2014-03-10 DIAGNOSIS — Z8619 Personal history of other infectious and parasitic diseases: Secondary | ICD-10-CM | POA: Insufficient documentation

## 2014-03-10 DIAGNOSIS — R079 Chest pain, unspecified: Secondary | ICD-10-CM | POA: Insufficient documentation

## 2014-03-10 DIAGNOSIS — I10 Essential (primary) hypertension: Secondary | ICD-10-CM

## 2014-03-10 DIAGNOSIS — R630 Anorexia: Secondary | ICD-10-CM | POA: Insufficient documentation

## 2014-03-10 DIAGNOSIS — Z7982 Long term (current) use of aspirin: Secondary | ICD-10-CM | POA: Insufficient documentation

## 2014-03-10 DIAGNOSIS — R5381 Other malaise: Secondary | ICD-10-CM | POA: Insufficient documentation

## 2014-03-10 DIAGNOSIS — R5383 Other fatigue: Secondary | ICD-10-CM

## 2014-03-10 DIAGNOSIS — R202 Paresthesia of skin: Secondary | ICD-10-CM

## 2014-03-10 DIAGNOSIS — Z79899 Other long term (current) drug therapy: Secondary | ICD-10-CM | POA: Insufficient documentation

## 2014-03-10 DIAGNOSIS — Z87448 Personal history of other diseases of urinary system: Secondary | ICD-10-CM | POA: Insufficient documentation

## 2014-03-10 DIAGNOSIS — Z8659 Personal history of other mental and behavioral disorders: Secondary | ICD-10-CM | POA: Insufficient documentation

## 2014-03-10 DIAGNOSIS — F17200 Nicotine dependence, unspecified, uncomplicated: Secondary | ICD-10-CM | POA: Insufficient documentation

## 2014-03-10 LAB — BASIC METABOLIC PANEL
BUN: 20 mg/dL (ref 6–23)
CHLORIDE: 101 meq/L (ref 96–112)
CO2: 24 mEq/L (ref 19–32)
Calcium: 8.6 mg/dL (ref 8.4–10.5)
Creatinine, Ser: 0.83 mg/dL (ref 0.50–1.35)
GFR calc Af Amer: >90 mL/min (ref >90)
GFR calc non Af Amer: >90 mL/min (ref >90)
GLUCOSE: 108 mg/dL — AB (ref 70–99)
Potassium: 3.7 mEq/L (ref 3.7–5.3)
SODIUM: 138 meq/L (ref 137–147)

## 2014-03-10 LAB — CBC
HEMATOCRIT: 43.4 % (ref 39.0–52.0)
HEMOGLOBIN: 15.2 g/dL (ref 13.0–17.0)
MCH: 32.3 pg (ref 26.0–34.0)
MCHC: 35 g/dL (ref 30.0–36.0)
MCV: 92.1 fL (ref 78.0–100.0)
Platelets: 166 10*3/uL (ref 150–400)
RBC: 4.71 MIL/uL (ref 4.22–5.81)
RDW: 12.7 % (ref 11.5–15.5)
WBC: 4.8 10*3/uL (ref 4.0–10.5)

## 2014-03-10 LAB — I-STAT TROPONIN, ED
Troponin i, poc: 0 ng/mL (ref 0.00–0.08)
Troponin i, poc: 0.01 ng/mL (ref 0.00–0.08)

## 2014-03-10 LAB — PRO B NATRIURETIC PEPTIDE: Pro B Natriuretic peptide (BNP): 169.7 pg/mL — ABNORMAL HIGH (ref 0–125)

## 2014-03-10 MED ORDER — ASPIRIN 325 MG PO TABS
325.0000 mg | ORAL_TABLET | Freq: Once | ORAL | Status: AC
  Administered 2014-03-10: 325 mg via ORAL
  Filled 2014-03-10: qty 1

## 2014-03-10 MED ORDER — SODIUM CHLORIDE 0.9 % IV BOLUS (SEPSIS)
1000.0000 mL | Freq: Once | INTRAVENOUS | Status: AC
  Administered 2014-03-10: 1000 mL via INTRAVENOUS

## 2014-03-10 MED ORDER — CLONIDINE HCL 0.1 MG PO TABS
0.2000 mg | ORAL_TABLET | Freq: Once | ORAL | Status: AC
  Administered 2014-03-10: 0.2 mg via ORAL

## 2014-03-10 NOTE — ED Provider Notes (Signed)
CSN: [id number]     Arrival date & time 03/10/14  1516 History   First MD Initiated Contact with Patient 03/10/14 1540     Chief Complaint  Patient presents with  . Chest Pain  . Hypertension     (Consider location/radiation/quality/duration/timing/severity/associated sxs/prior Treatment) Patient is a 63 y.o. male presenting with chest pain and hypertension. The history is provided by the patient.  Chest Pain Pain location:  Substernal area Pain quality: sharp   Pain radiates to:  Does not radiate Pain radiates to the back: no   Pain severity:  Moderate Onset quality:  Unable to specify Duration: 2-3 years, weekly. Timing:  Intermittent Progression:  Unchanged Chronicity:  Chronic Context: at rest   Relieved by:  Nothing Worsened by:  Nothing tried Ineffective treatments:  None tried Associated symptoms: anorexia, fatigue, numbness and weakness   Associated symptoms: no abdominal pain, no back pain, no cough, no diaphoresis, no dizziness, no dysphagia, no fever, no headache, no lower extremity edema, no nausea, no shortness of breath and not vomiting   Fatigue:    Severity:  Moderate   Duration:  1 week   Timing:  Constant   Progression:  Worsening Weakness:    Severity:  Moderate   Onset quality:  Unable to specify   Duration:  1 week   Chronicity:  New   Timing:  Constant   Progression:  Worsening Risk factors: hypertension   Hypertension Associated symptoms include chest pain. Pertinent negatives include no abdominal pain, no headaches and no shortness of breath. Nothing aggravates the symptoms. Nothing relieves the symptoms. Treatments tried: coreg, hydralyzine, and PRN clonadine in PCP office. The treatment provided mild relief.    Past Medical History  Diagnosis Date  . Hypertension   . TOBACCO USE 09/07/2007    Qualifier: Diagnosis of  By: Leanne Chang MD, Bruce    . HEPATITIS C 05/10/2007    Qualifier: Diagnosis of  By: Leanne Chang MD, Bruce    . BPH (benign prostatic  hyperplasia) 10/10/2013  . Anxiety state, unspecified 10/10/2013   Past Surgical History  Procedure Laterality Date  . Tonsillectomy     History reviewed. No pertinent family history. History  Substance Use Topics  . Smoking status: Light Tobacco Smoker    Types: Cigarettes  . Smokeless tobacco: Not on file  . Alcohol Use: Yes    Review of Systems  Constitutional: Positive for fatigue. Negative for fever, diaphoresis, activity change and appetite change.  HENT: Negative for congestion, facial swelling, rhinorrhea and trouble swallowing.   Eyes: Negative for photophobia and pain.  Respiratory: Negative for cough, chest tightness and shortness of breath.   Cardiovascular: Positive for chest pain. Negative for leg swelling.  Gastrointestinal: Positive for anorexia. Negative for nausea, vomiting, abdominal pain, diarrhea and constipation.  Endocrine: Negative for polydipsia and polyuria.  Genitourinary: Negative for dysuria, urgency, decreased urine volume and difficulty urinating.  Musculoskeletal: Negative for back pain and gait problem.  Skin: Negative for color change, rash and wound.  Allergic/Immunologic: Negative for immunocompromised state.  Neurological: Positive for weakness and numbness. Negative for dizziness, facial asymmetry, speech difficulty and headaches.  Psychiatric/Behavioral: Negative for confusion, decreased concentration and agitation.      Allergies  Review of patient's allergies indicates no known allergies.  Home Medications   Current Outpatient Rx  Name  Route  Sig  Dispense  Refill  . aspirin 81 MG tablet   Oral   Take 81 mg by mouth daily.         Marland Kitchen  carvedilol (COREG) 6.25 MG tablet   Oral   Take 1 tablet (6.25 mg total) by mouth 2 (two) times daily with a meal.   60 tablet   3   . hydrALAZINE (APRESOLINE) 50 MG tablet   Oral   Take 2 tablets (100 mg total) by mouth 3 (three) times daily.   120 tablet   3    BP 130/77  Pulse 62   Temp(Src) 98.1 F (36.7 C) (Oral)  Resp 19  SpO2 94% Physical Exam  Constitutional: He is oriented to person, place, and time. He appears well-developed and well-nourished. No distress.  HENT:  Head: Normocephalic and atraumatic.  Mouth/Throat: No oropharyngeal exudate.  Eyes: Pupils are equal, round, and reactive to light.  Neck: Normal range of motion. Neck supple.  Cardiovascular: Normal rate, regular rhythm and normal heart sounds.  Exam reveals no gallop and no friction rub.   No murmur heard. Pulmonary/Chest: Effort normal and breath sounds normal. No respiratory distress. He has no wheezes. He has no rales.  Abdominal: Soft. Bowel sounds are normal. He exhibits no distension and no mass. There is no tenderness. There is no rebound and no guarding.  Musculoskeletal: Normal range of motion. He exhibits no edema and no tenderness.  Neurological: He is alert and oriented to person, place, and time. He has normal strength. He displays no tremor. A sensory deficit is present. He exhibits normal muscle tone. He displays a negative Romberg sign. Coordination and gait normal. GCS eye subscore is 4. GCS verbal subscore is 5. GCS motor subscore is 6.  Reports paresthesias to both lips and BL feet (entire)  Skin: Skin is warm and dry.  Psychiatric: He has a normal mood and affect.    ED Course  Procedures (including critical care time) Labs Review Labs Reviewed  BASIC METABOLIC PANEL - Abnormal; Notable for the following:    Glucose, Bld 108 (*)    All other components within normal limits  PRO B NATRIURETIC PEPTIDE - Abnormal; Notable for the following:    Pro B Natriuretic peptide (BNP) 169.7 (*)    All other components within normal limits  CBC  I-STAT TROPOININ, ED  Randolm Idol, ED   Imaging Review Dg Chest 2 View  03/10/2014   CLINICAL DATA:  Chest pain.  EXAM: CHEST  2 VIEW  COMPARISON:  None.  FINDINGS: The heart size and mediastinal contours are within normal limits.  Both lungs are clear. No pneumothorax or pleural effusion is noted. The visualized skeletal structures are unremarkable.  IMPRESSION: No acute cardiopulmonary abnormality seen.   Electronically Signed   By: Sabino Dick M.D.   On: 03/10/2014 16:57   Ct Head Wo Contrast  03/10/2014   CLINICAL DATA:  Facial and lip numbness.  History of hypertension.  EXAM: CT HEAD WITHOUT CONTRAST  TECHNIQUE: Contiguous axial images were obtained from the base of the skull through the vertex without intravenous contrast.  COMPARISON:  None.  FINDINGS: The brain demonstrates no evidence of hemorrhage, infarction, edema, mass effect, extra-axial fluid collection, hydrocephalus or mass lesion. The skull is unremarkable.  IMPRESSION: Normal head CT.   Electronically Signed   By: Aletta Edouard M.D.   On: 03/10/2014 17:19     EKG Interpretation   Date/Time:  Monday March 10 2014 15:16:44 EDT Ventricular Rate:  71 PR Interval:  130 QRS Duration: 87 QT Interval:  416 QTC Calculation: 452 R Axis:   20 Text Interpretation:  Sinus rhythm Anteroseptal infarct, age indeterminate  No prior for comparison Confirmed by DOCHERTY  MD, MEGAN [phone]) on  03/10/2014 3:48:56 PM      MDM   Final diagnoses:  Chest pain  Hypertension  Paresthesias    Pt is a 63 y.o. male with Pmhx as above who presents with intermittent CP (2-3 years), intermittent face/lip/feet numbness (2-3 years), and HTN from [place]. No CP currently, but does report lip numbness since about 5pm yesterday. SBP at office today 200. He has been on HTN meds about 2 week, doses increased 3 days ago. On PE, no objective neuro findings, though reports subjective dec sensation to both lips.  W/U shows no signs of end organ damage from HTN including nml CT head, nml delta trop, nml Cr.  Pt's reported paresthesias are not anatomical for CVA/TIA symptoms. Spoke w/ neurology who agrees he does not need further imaging in ED and can f/u as outpt. BP  normalized in ED w/o intervention. Will d/c home for him to f/u closely w/ PCP to continue titration of home BP meds. Return precautions given for new or worsening symptoms including worsening pain, change in neuro symptoms.          Neta Ehlers, MD 03/11/14 [phone]

## 2014-03-10 NOTE — ED Notes (Signed)
[facility] presents with a 62 yo male from [facility] with hypertension and had CP this am.  Upon [facility] arrival, BP was 200/p and 194/106 at [facility].  Pt states he has been experiencing CP off and on and hypertension (pt has a record written on paper).  He went to [facility] early because he felt like he wasn't going to be here for tomorrow's appointment if he didn't go today.  ECG unremarkable.  Hx. Of Intermittent facial numbness and foot numbness with CP, HTN and takes prescribed HTN meds.  Lip numbness today with lethargy.

## 2014-03-10 NOTE — Progress Notes (Unsigned)
   Subjective:    Patient ID: James Avila, male    DOB: 1951-08-10, 63 y.o.   MRN: [id number]  HPI  Pt comes in with c./o intermit Left chest wall pain radiating to right chest with facial numb/tingling since Saturday night. States he came out of store very dizzy.pt is taking bp meds for uncontrolled HTN BP today 194/106 97 Clonidine 0.2 mg given per protocol Stat EKG- NSR Pt rates pain 3/10 Report called to charge nurse [facility]   Review of Systems     Objective:   Physical Exam        Assessment & Plan:

## 2014-03-10 NOTE — Discharge Planning (Signed)
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 Patient expressed no concerns with pcp or getting medications at this time. Case management referral sent to [facility] case manager, will follow up.

## 2014-03-10 NOTE — Discharge Instructions (Signed)
Arterial Hypertension °Arterial hypertension (high blood pressure) is a condition of elevated pressure in your blood vessels. Hypertension over a long period of time is a risk factor for strokes, heart attacks, and heart failure. It is also the leading cause of kidney (renal) failure.  °CAUSES  °· In Adults -- Over 90% of all hypertension has no known cause. This is called essential or primary hypertension. In the other 10% of people with hypertension, the increase in blood pressure is caused by another disorder. This is called secondary hypertension. Important causes of secondary hypertension are: °· Heavy alcohol use. °· Obstructive sleep apnea. °· Hyperaldosterosim (Conn's syndrome). °· Steroid use. °· Chronic kidney failure. °· Hyperparathyroidism. °· Medications. °· Renal artery stenosis. °· Pheochromocytoma. °· Cushing's disease. °· Coarctation of the aorta. °· Scleroderma renal crisis. °· Licorice (in excessive amounts). °· Drugs (cocaine, methamphetamine). °Your caregiver can explain any items above that apply to you. °· In Children -- Secondary hypertension is more common and should always be considered. °· Pregnancy -- Few women of childbearing age have high blood pressure. However, up to 10% of them develop hypertension of pregnancy. Generally, this will not harm the woman. It may be a sign of 3 complications of pregnancy: preeclampsia, HELLP syndrome, and eclampsia. Follow up and control with medication is necessary. °SYMPTOMS  °· This condition normally does not produce any noticeable symptoms. It is usually found during a routine exam. °· Malignant hypertension is a late problem of high blood pressure. It may have the following symptoms: °· Headaches. °· Blurred vision. °· End-organ damage (this means your kidneys, heart, lungs, and other organs are being damaged). °· Stressful situations can increase the blood pressure. If a person with normal blood pressure has their blood pressure go up while being  seen by their caregiver, this is often termed "white coat hypertension." Its importance is not known. It may be related with eventually developing hypertension or complications of hypertension. °· Hypertension is often confused with mental tension, stress, and anxiety. °DIAGNOSIS  °The diagnosis is made by 3 separate blood pressure measurements. They are taken at least 1 week apart from each other. If there is organ damage from hypertension, the diagnosis may be made without repeat measurements. °Hypertension is usually identified by having blood pressure readings: °· Above 140/90 mmHg measured in both arms, at 3 separate times, over a couple weeks. °· Over 130/80 mmHg should be considered a risk factor and may require treatment in patients with diabetes. °Blood pressure readings over 120/80 mmHg are called "pre-hypertension" even in non-diabetic patients. °To get a true blood pressure measurement, use the following guidelines. Be aware of the factors that can alter blood pressure readings. °· Take measurements at least 1 hour after caffeine. °· Take measurements 30 minutes after smoking and without any stress. This is another reason to quit smoking  it raises your blood pressure. °· Use a proper cuff size. Ask your caregiver if you are not sure about your cuff size. °· Most home blood pressure cuffs are automatic. They will measure systolic and diastolic pressures. The systolic pressure is the pressure reading at the start of sounds. Diastolic pressure is the pressure at which the sounds disappear. If you are elderly, measure pressures in multiple postures. Try sitting, lying or standing. °· Sit at rest for a minimum of 5 minutes before taking measurements. °· You should not be on any medications like decongestants. These are found in many cold medications. °· Record your blood pressure readings and review   them with your caregiver. °If you have hypertension: °· Your caregiver may do tests to be sure you do not have  secondary hypertension (see "causes" above). °· Your caregiver may also look for signs of metabolic syndrome. This is also called Syndrome X or Insulin Resistance Syndrome. You may have this syndrome if you have type 2 diabetes, abdominal obesity, and abnormal blood lipids in addition to hypertension. °· Your caregiver will take your medical and family history and perform a physical exam. °· Diagnostic tests may include blood tests (for glucose, cholesterol, potassium, and kidney function), a urinalysis, or an EKG. Other tests may also be necessary depending on your condition. °PREVENTION  °There are important lifestyle issues that you can adopt to reduce your chance of developing hypertension: °· Maintain a normal weight. °· Limit the amount of salt (sodium) in your diet. °· Exercise often. °· Limit alcohol intake. °· Get enough potassium in your diet. Discuss specific advice with your caregiver. °· Follow a DASH diet (dietary approaches to stop hypertension). This diet is rich in fruits, vegetables, and low-fat dairy products, and avoids certain fats. °PROGNOSIS  °Essential hypertension cannot be cured. Lifestyle changes and medical treatment can lower blood pressure and reduce complications. The prognosis of secondary hypertension depends on the underlying cause. Many people whose hypertension is controlled with medicine or lifestyle changes can live a normal, healthy life.  °RISKS AND COMPLICATIONS  °While high blood pressure alone is not an illness, it often requires treatment due to its short- and long-term effects on many organs. Hypertension increases your risk for: °· CVAs or strokes (cerebrovascular accident). °· Heart failure due to chronically high blood pressure (hypertensive cardiomyopathy). °· Heart attack (myocardial infarction). °· Damage to the retina (hypertensive retinopathy). °· Kidney failure (hypertensive nephropathy). °Your caregiver can explain list items above that apply to you. Treatment  of hypertension can significantly reduce the risk of complications. °TREATMENT  °· For overweight patients, weight loss and regular exercise are recommended. Physical fitness lowers blood pressure. °· Mild hypertension is usually treated with diet and exercise. A diet rich in fruits and vegetables, fat-free dairy products, and foods low in fat and salt (sodium) can help lower blood pressure. Decreasing salt intake decreases blood pressure in a 1/3 of people. °· Stop smoking if you are a smoker. °The steps above are highly effective in reducing blood pressure. While these actions are easy to suggest, they are difficult to achieve. Most patients with moderate or severe hypertension end up requiring medications to bring their blood pressure down to a normal level. There are several classes of medications for treatment. Blood pressure pills (antihypertensives) will lower blood pressure by their different actions. Lowering the blood pressure by 10 mmHg may decrease the risk of complications by as much as 25%. °The goal of treatment is effective blood pressure control. This will reduce your risk for complications. Your caregiver will help you determine the best treatment for you according to your lifestyle. What is excellent treatment for one person, may not be for you. °HOME CARE INSTRUCTIONS  °· Do not smoke. °· Follow the lifestyle changes outlined in the "Prevention" section. °· If you are on medications, follow the directions carefully. Blood pressure medications must be taken as prescribed. Skipping doses reduces their benefit. It also puts you at risk for problems. °· Follow up with your caregiver, as directed. °· If you are asked to monitor your blood pressure at home, follow the guidelines in the "Diagnosis" section above. °SEEK MEDICAL CARE   IF:   You think you are having medication side effects.  You have recurrent headaches or lightheadedness.  You have swelling in your ankles.  You have trouble with  your vision. SEEK IMMEDIATE MEDICAL CARE IF:   You have sudden onset of chest pain or pressure, difficulty breathing, or other symptoms of a heart attack.  You have a severe headache.  You have symptoms of a stroke (such as sudden weakness, difficulty speaking, difficulty walking). MAKE SURE YOU:   Understand these instructions.  Will watch your condition.  Will get help right away if you are not doing well or get worse. Document Released: 11/14/2005 Document Revised: 02/06/2012 Document Reviewed: 06/14/2007 [facility] Patient Information 2014 [place]. Chest Pain (Nonspecific) It is often hard to give a specific diagnosis for the cause of chest pain. There is always a chance that your pain could be related to something serious, such as a heart attack or a blood clot in the lungs. You need to follow up with your caregiver for further evaluation. CAUSES   Heartburn.  Pneumonia or bronchitis.  Anxiety or stress.  Inflammation around your heart (pericarditis) or lung (pleuritis or pleurisy).  A blood clot in the lung.  A collapsed lung (pneumothorax). It can develop suddenly on its own (spontaneous pneumothorax) or from injury (trauma) to the chest.  Shingles infection (herpes zoster virus). The chest wall is composed of bones, muscles, and cartilage. Any of these can be the source of the pain.  The bones can be bruised by injury.  The muscles or cartilage can be strained by coughing or overwork.  The cartilage can be affected by inflammation and become sore (costochondritis). DIAGNOSIS  Lab tests or other studies, such as X-rays, electrocardiography, stress testing, or cardiac imaging, may be needed to find the cause of your pain.  TREATMENT   Treatment depends on what may be causing your chest pain. Treatment may include:  Acid blockers for heartburn.  Anti-inflammatory medicine.  Pain medicine for inflammatory conditions.  Antibiotics if an infection is  present.  You may be advised to change lifestyle habits. This includes stopping smoking and avoiding alcohol, caffeine, and chocolate.  You may be advised to keep your head raised (elevated) when sleeping. This reduces the chance of acid going backward from your stomach into your esophagus.  Most of the time, nonspecific chest pain will improve within 2 to 3 days with rest and mild pain medicine. HOME CARE INSTRUCTIONS   If antibiotics were prescribed, take your antibiotics as directed. Finish them even if you start to feel better.  For the next few days, avoid physical activities that bring on chest pain. Continue physical activities as directed.  Do not smoke.  Avoid drinking alcohol.  Only take over-the-counter or prescription medicine for pain, discomfort, or fever as directed by your caregiver.  Follow your caregiver's suggestions for further testing if your chest pain does not go away.  Keep any follow-up appointments you made. If you do not go to an appointment, you could develop lasting (chronic) problems with pain. If there is any problem keeping an appointment, you must call to reschedule. SEEK MEDICAL CARE IF:   You think you are having problems from the medicine you are taking. Read your medicine instructions carefully.  Your chest pain does not go away, even after treatment.  You develop a rash with blisters on your chest. SEEK IMMEDIATE MEDICAL CARE IF:   You have increased chest pain or pain that spreads to your arm,  neck, jaw, back, or abdomen.  You develop shortness of breath, an increasing cough, or you are coughing up blood.  You have severe back or abdominal pain, feel nauseous, or vomit.  You develop severe weakness, fainting, or chills.  You have a fever. THIS IS AN EMERGENCY. Do not wait to see if the pain will go away. Get medical help at once. Call your local emergency services (911 in [place] Do not drive yourself to the hospital. MAKE SURE YOU:    Understand these instructions.  Will watch your condition.  Will get help right away if you are not doing well or get worse. Document Released: 08/24/2005 Document Revised: 02/06/2012 Document Reviewed: 06/19/2008 [facility] Patient Information 2014 [place].  Paresthesia Paresthesia is an abnormal burning or prickling sensation. This sensation is generally felt in the hands, arms, legs, or feet. However, it may occur in any part of the body. It is usually not painful. The feeling may be described as:  Tingling or numbness.  "Pins and needles."  Skin crawling.  Buzzing.  Limbs "falling asleep."  Itching. Most people experience temporary (transient) paresthesia at some time in their lives. CAUSES  Paresthesia may occur when you breathe too quickly (hyperventilation). It can also occur without any apparent cause. Commonly, paresthesia occurs when pressure is placed on a nerve. The feeling quickly goes away once the pressure is removed. For some people, however, paresthesia is a long-lasting (chronic) condition caused by an underlying disorder. The underlying disorder may be:  A traumatic, direct injury to nerves. Examples include a:  Broken (fractured) neck.  Fractured skull.  A disorder affecting the brain and spinal cord (central nervous system). Examples include:  Transverse myelitis.  Encephalitis.  Transient ischemic attack.  Multiple sclerosis.  Stroke.  Tumor or blood vessel problems, such as an arteriovenous malformation pressing against the brain or spinal cord.  A condition that damages the peripheral nerves (peripheral neuropathy). Peripheral nerves are not part of the brain and spinal cord. These conditions include:  Diabetes.  Peripheral vascular disease.  Nerve entrapment syndromes, such as carpal tunnel syndrome.  Shingles.  Hypothyroidism.  Vitamin B12 deficiencies.  Alcoholism.  Heavy metal poisoning (lead, arsenic).  Rheumatoid  arthritis.  Systemic lupus erythematosus. DIAGNOSIS  Your caregiver will attempt to find the underlying cause of your paresthesia. Your caregiver may:  Take your medical history.  Perform a physical exam.  Order various lab tests.  Order imaging tests. TREATMENT  Treatment for paresthesia depends on the underlying cause. HOME CARE INSTRUCTIONS  Avoid drinking alcohol.  You may consider massage or acupuncture to help relieve your symptoms.  Keep all follow-up appointments as directed by your caregiver. SEEK IMMEDIATE MEDICAL CARE IF:   You feel weak.  You have trouble walking or moving.  You have problems with speech or vision.  You feel confused.  You cannot control your bladder or bowel movements.  You feel numbness after an injury.  You faint.  Your burning or prickling feeling gets worse when walking.  You have pain, cramps, or dizziness.  You develop a rash. MAKE SURE YOU:  Understand these instructions.  Will watch your condition.  Will get help right away if you are not doing well or get worse. Document Released: 11/04/2002 Document Revised: 02/06/2012 Document Reviewed: 08/05/2011 [facility] Patient Information 2014 [place].

## 2014-03-10 NOTE — ED Notes (Signed)
Pt ambulated to bathroom with no difficulty 

## 2014-03-12 ENCOUNTER — Encounter: Payer: Self-pay | Admitting: *Deleted

## 2014-03-13 ENCOUNTER — Encounter: Payer: Self-pay | Admitting: *Deleted

## 2014-03-17 ENCOUNTER — Other Ambulatory Visit (INDEPENDENT_AMBULATORY_CARE_PROVIDER_SITE_OTHER): Payer: No Typology Code available for payment source

## 2014-03-17 DIAGNOSIS — B182 Chronic viral hepatitis C: Secondary | ICD-10-CM

## 2014-03-18 LAB — HEPATITIS C RNA QUANTITATIVE
HCV QUANT: 542526 [IU]/mL — AB (ref <15)
HCV Quantitative Log: 5.73 {Log} — ABNORMAL HIGH (ref <1.18)

## 2014-03-24 LAB — HEPATITIS C GENOTYPE

## 2014-03-26 ENCOUNTER — Telehealth: Payer: Self-pay | Admitting: Internal Medicine

## 2014-03-26 ENCOUNTER — Other Ambulatory Visit: Payer: Self-pay | Admitting: *Deleted

## 2014-03-26 ENCOUNTER — Ambulatory Visit: Payer: No Typology Code available for payment source | Admitting: *Deleted

## 2014-03-26 VITALS — BP 184/95 | HR 70 | Temp 97.7°F | Resp 18

## 2014-03-26 DIAGNOSIS — I10 Essential (primary) hypertension: Secondary | ICD-10-CM

## 2014-03-26 MED ORDER — LISINOPRIL-HYDROCHLOROTHIAZIDE 10-12.5 MG PO TABS: 1.0000 | ORAL_TABLET | Freq: Every day | ORAL | Status: DC

## 2014-03-26 NOTE — Progress Notes (Unsigned)
Patient in today complaining of side effects from increase in Hydralazine.

## 2014-03-26 NOTE — Telephone Encounter (Signed)
Pt has come in today to say that he does not feel that his BP medication is working for him; please f/u with pt

## 2014-03-26 NOTE — Progress Notes (Signed)
Patient came in complaining of side effects of Hydralazine. Patient complains of numbness of face and lips, headaches, chills, sleeping a lot, little appetite, and panic attacks. Consulted with Roney Jaffe, NP. Valerie discontinued Hydralazine and placed patient on Lisinopril-HCTZ 10/12.5 mg daily. Patient given a nurse visit appointment for [id number] at 2:00 PM for blood pressure recheck. Patient verbalized understanding. Alverda Skeans, RN

## 2014-04-02 ENCOUNTER — Ambulatory Visit: Payer: No Typology Code available for payment source | Attending: Internal Medicine | Admitting: *Deleted

## 2014-04-02 VITALS — BP 114/74 | HR 67 | Temp 98.0°F | Resp 16

## 2014-04-02 DIAGNOSIS — I10 Essential (primary) hypertension: Secondary | ICD-10-CM

## 2014-04-02 MED ORDER — LISINOPRIL-HYDROCHLOROTHIAZIDE 20-25 MG PO TABS: 1.0000 | ORAL_TABLET | Freq: Every day | ORAL | Status: DC

## 2014-04-02 NOTE — Patient Instructions (Signed)
Your provider has increased your Lisinopril-HCTZ medication. Continue to keep a log and bring back with you on your next blood pressure recheck appointment. Keep your follow up appointment with your PCP. If you have any problems please call the clinic [phone].

## 2014-04-02 NOTE — Progress Notes (Signed)
Patient here today for blood pressure recheck. Patient states instead of taking Lisinopril-HCTZ 10-12.5 mg he has been taking 20-25 mg. Patient has kept of log of his blood pressure before and after taking medication. Consulted with Roney Jaffe, NP who has discontinued the original dose of Lisinopril-HCTZ 10-12.5 mg and prescribed Lisinopril-HCTZ 20-25 mg. Wants patient to come back in one week for blood pressure recheck.

## 2014-04-09 ENCOUNTER — Ambulatory Visit: Payer: No Typology Code available for payment source | Attending: Internal Medicine | Admitting: *Deleted

## 2014-04-09 ENCOUNTER — Other Ambulatory Visit: Payer: Self-pay | Admitting: Internal Medicine

## 2014-04-09 VITALS — BP 129/80 | HR 66 | Temp 98.1°F | Resp 14

## 2014-04-09 DIAGNOSIS — J069 Acute upper respiratory infection, unspecified: Secondary | ICD-10-CM

## 2014-04-09 MED ORDER — AZITHROMYCIN 250 MG PO TABS: ORAL_TABLET | ORAL | Status: DC

## 2014-04-09 NOTE — Patient Instructions (Signed)

## 2014-04-09 NOTE — Progress Notes (Signed)
Patient here for BP recheck. Patient also complaining of cough and running nose for two weeks. Lungs clear to auscultation. Patient states he is coughing up yellow sputum, having congestion, headaches. No tenderness noted on sinus areas. Consulted with Roney Jaffe, NP who prescribed a Z-pack. Alverda Skeans, RN

## 2014-04-30 ENCOUNTER — Encounter: Payer: Self-pay | Admitting: Internal Medicine

## 2014-04-30 ENCOUNTER — Ambulatory Visit (INDEPENDENT_AMBULATORY_CARE_PROVIDER_SITE_OTHER): Payer: No Typology Code available for payment source | Admitting: Internal Medicine

## 2014-04-30 VITALS — BP 164/96 | HR 64 | Temp 98.2°F | Ht 71.0 in | Wt 185.0 lb

## 2014-04-30 DIAGNOSIS — B1920 Unspecified viral hepatitis C without hepatic coma: Secondary | ICD-10-CM

## 2014-04-30 LAB — CBC WITH DIFFERENTIAL/PLATELET
BASOS ABS: 0.1 10*3/uL (ref 0.0–0.1)
Basophils Relative: 1 % (ref 0–1)
EOS PCT: 6 % — AB (ref 0–5)
Eosinophils Absolute: 0.3 10*3/uL (ref 0.0–0.7)
HEMATOCRIT: 42.5 % (ref 39.0–52.0)
Hemoglobin: 14.8 g/dL (ref 13.0–17.0)
Lymphocytes Relative: 48 % — ABNORMAL HIGH (ref 12–46)
Lymphs Abs: 2.6 10*3/uL (ref 0.7–4.0)
MCH: 31.4 pg (ref 26.0–34.0)
MCHC: 34.8 g/dL (ref 30.0–36.0)
MCV: 90 fL (ref 78.0–100.0)
MONO ABS: 0.6 10*3/uL (ref 0.1–1.0)
Monocytes Relative: 11 % (ref 3–12)
Neutro Abs: 1.8 10*3/uL (ref 1.7–7.7)
Neutrophils Relative %: 34 % — ABNORMAL LOW (ref 43–77)
Platelets: 191 10*3/uL (ref 150–400)
RBC: 4.72 MIL/uL (ref 4.22–5.81)
RDW: 12.8 % (ref 11.5–15.5)
WBC: 5.4 10*3/uL (ref 4.0–10.5)

## 2014-04-30 LAB — COMPLETE METABOLIC PANEL WITH GFR
ALK PHOS: 62 U/L (ref 39–117)
ALT: 78 U/L — ABNORMAL HIGH (ref 0–53)
AST: 46 U/L — ABNORMAL HIGH (ref 0–37)
Albumin: 4.2 g/dL (ref 3.5–5.2)
BUN: 15 mg/dL (ref 6–23)
CALCIUM: 9.1 mg/dL (ref 8.4–10.5)
CHLORIDE: 102 meq/L (ref 96–112)
CO2: 31 mEq/L (ref 19–32)
Creat: 0.98 mg/dL (ref 0.50–1.35)
GFR, EST NON AFRICAN AMERICAN: 83 mL/min
GFR, Est African American: >89 mL/min
GLUCOSE: 72 mg/dL (ref 70–99)
POTASSIUM: 4.5 meq/L (ref 3.5–5.3)
SODIUM: 138 meq/L (ref 135–145)
TOTAL PROTEIN: 7.5 g/dL (ref 6.0–8.3)
Total Bilirubin: 0.8 mg/dL (ref 0.2–1.2)

## 2014-04-30 LAB — IRON: Iron: 137 ug/dL (ref 42–165)

## 2014-04-30 NOTE — Progress Notes (Signed)
+  James Avila is a 63 y.o. male who presents for initial evaluation and management of a positive Hepatitis C antibody test.  Patient tested positive in 2003 after routine screen. Test was performed as part of an evaluation of routine screen. 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 Patient denies accidental needle stick, acupuncture, history of blood transfusion, intranasal drug use, multiple sexual partners, renal dialysis, sexual contact with person with liver disease, tattoos. Patient has had other studies performed. Results: hepatitis C RNA by PCR, result: positive. Patient has not had prior treatment for Hepatitis C. Patient does not have a past history of liver disease. Patient does not have a family history of liver disease.   HPI:   Patient does not have documented immunity to Hepatitis A. Patient does not have documented immunity to Hepatitis B.     Review of Systems A comprehensive review of systems was negative.   Past Medical History  Diagnosis Date  . Hypertension   . TOBACCO USE 09/07/2007    Qualifier: Diagnosis of  By: Leanne Chang MD, Bruce    . HEPATITIS C 05/10/2007    Qualifier: Diagnosis of  By: Leanne Chang MD, Bruce    . BPH (benign prostatic hyperplasia) 10/10/2013  . Anxiety state, unspecified 10/10/2013    History  Substance Use Topics  . Smoking status: Light Tobacco Smoker    Types: Cigarettes  . Smokeless tobacco: Not on file  . Alcohol Use: 1.2 oz/week    2 Cans of beer per week    No family history on file.    Objective:   Filed Vitals:   04/30/14 1404  BP: 164/96  Pulse: 64  Temp: 98.2 F (36.8 C)   in no apparent distress and well developed and well nourished HEENT: anicteric Cor RRR and No murmurs clear Bowel sounds are normal, liver is not enlarged, spleen is not enlarged peripheral pulses normal, no pedal edema, no clubbing or cyanosis negative for - jaundice, spider hemangioma, telangiectasia,  palmar erythema, ecchymosis and atrophy  Laboratory Genotype:  Lab Results  Component Value Date   HCVGENOTYPE 1a 03/17/2014   HCV viral load:  Lab Results  Component Value Date   HCVQUANT 938101* 03/17/2014   Lab Results  Component Value Date   WBC 4.8 03/10/2014   HGB 15.2 03/10/2014   HCT 43.4 03/10/2014   MCV 92.1 03/10/2014   PLT 166 03/10/2014    Lab Results  Component Value Date   CREATININE 0.83 03/10/2014   BUN 20 03/10/2014   NA 138 03/10/2014   K 3.7 03/10/2014   CL 101 03/10/2014   CO2 24 03/10/2014    Lab Results  Component Value Date   ALT 93* 10/10/2013   AST 55* 10/10/2013   ALKPHOS 79 10/10/2013   BILITOT 0.9 10/10/2013      Assessment: Hepatitis C genotype 1a  Plan: 1) Patient counseled extensively on limiting acetaminophen to no more than 2 grams daily, avoidance of alcohol. 2) Transmission discussed with patient including sexual transmission, sharing razors and toothbrush.   3) Will need referral to gastroenterology:No 4) Will need referral for substance abuse counseling: No 5) Will prescribe Harvoni for 12 weeks at next appt.  6) Follow up 2 weeks after elastography

## 2014-05-01 LAB — HEPATITIS A ANTIBODY, TOTAL: Hep A Total Ab: NONREACTIVE

## 2014-05-01 LAB — HIV ANTIBODY (ROUTINE TESTING W REFLEX): HIV 1&2 Ab, 4th Generation: NONREACTIVE

## 2014-05-01 LAB — HEPATITIS B SURFACE ANTIBODY,QUALITATIVE: Hep B S Ab: NEGATIVE

## 2014-05-01 LAB — HEPATITIS B CORE ANTIBODY, TOTAL: Hep B Core Total Ab: REACTIVE — AB

## 2014-05-01 LAB — ANA: Anti Nuclear Antibody(ANA): NEGATIVE

## 2014-05-01 LAB — HEPATITIS B SURFACE ANTIGEN: Hepatitis B Surface Ag: NEGATIVE

## 2014-05-01 LAB — PROTIME-INR
INR: 1.01 (ref <1.50)
Prothrombin Time: 13.2 seconds (ref 11.6–15.2)

## 2014-05-06 ENCOUNTER — Ambulatory Visit (HOSPITAL_COMMUNITY): Payer: No Typology Code available for payment source

## 2014-05-15 ENCOUNTER — Ambulatory Visit (HOSPITAL_COMMUNITY): Payer: No Typology Code available for payment source

## 2014-05-15 ENCOUNTER — Ambulatory Visit (HOSPITAL_COMMUNITY)
Admission: RE | Admit: 2014-05-15 | Discharge: 2014-05-15 | Disposition: A | Discharge status: Home / Self Care | Payer: No Typology Code available for payment source | Source: Ambulatory Visit | Attending: Internal Medicine | Admitting: Internal Medicine

## 2014-05-15 DIAGNOSIS — B1920 Unspecified viral hepatitis C without hepatic coma: Secondary | ICD-10-CM | POA: Insufficient documentation

## 2014-06-03 ENCOUNTER — Encounter: Payer: Self-pay | Admitting: Internal Medicine

## 2014-06-03 ENCOUNTER — Ambulatory Visit: Payer: No Typology Code available for payment source | Attending: Internal Medicine | Admitting: Internal Medicine

## 2014-06-03 VITALS — BP 138/93 | HR 69 | Temp 98.0°F | Resp 16 | Wt 182.6 lb

## 2014-06-03 DIAGNOSIS — B1920 Unspecified viral hepatitis C without hepatic coma: Secondary | ICD-10-CM | POA: Insufficient documentation

## 2014-06-03 DIAGNOSIS — F17200 Nicotine dependence, unspecified, uncomplicated: Secondary | ICD-10-CM

## 2014-06-03 DIAGNOSIS — Z8619 Personal history of other infectious and parasitic diseases: Secondary | ICD-10-CM

## 2014-06-03 DIAGNOSIS — I10 Essential (primary) hypertension: Secondary | ICD-10-CM

## 2014-06-03 NOTE — Progress Notes (Signed)
MRN: [id number] Name: James Avila  Sex: male Age: 63 y.o. DOB: Jul 04, 1951  Allergies: Hydralazine  Chief Complaint  Patient presents with  . Follow-up    HPI: Patient is 63 y.o. male who has history of hypertension, hepatitis C comes today for followup her, denies any headache dizziness chest and shortness of breath, has been compliant in taking his medication currently is taking lisinopril/hydrochlorothiazide, Coreg, patient is also following up with ID for hepatitis C. Patient is to smoke cigarettes, I have advised patient to quit smoking.  Past Medical History  Diagnosis Date  . Hypertension   . TOBACCO USE 09/07/2007    Qualifier: Diagnosis of  By: Leanne Chang MD, Bruce    . HEPATITIS C 05/10/2007    Qualifier: Diagnosis of  By: Leanne Chang MD, Bruce    . BPH (benign prostatic hyperplasia) 10/10/2013  . Anxiety state, unspecified 10/10/2013    Past Surgical History  Procedure Laterality Date  . Tonsillectomy        Medication List       This list is accurate as of: 06/03/14  3:29 PM.  Always use your most recent med list.               aspirin 81 MG tablet  Take 81 mg by mouth daily.     carvedilol 6.25 MG tablet  Commonly known as:  COREG  Take 1 tablet (6.25 mg total) by mouth 2 (two) times daily with a meal.     lisinopril-hydrochlorothiazide 20-25 MG per tablet  Commonly known as:  PRINZIDE,ZESTORETIC  Take 1 tablet by mouth daily.        No orders of the defined types were placed in this encounter.     There is no immunization history on file for this patient.  History reviewed. No pertinent family history.  History  Substance Use Topics  . Smoking status: Light Tobacco Smoker    Types: Cigarettes  . Smokeless tobacco: Not on file  . Alcohol Use: 1.2 oz/week    2 Cans of beer per week    Review of Systems   As noted in HPI  Filed Vitals:   06/03/14 1513  BP: 138/93  Pulse: 69  Temp: 98 F (36.7 C)  Resp: 16    Physical  Exam  Physical Exam  Constitutional: No distress.  Eyes: EOM are normal. Pupils are equal, round, and reactive to light. No scleral icterus.  Cardiovascular: Normal rate and regular rhythm.   Pulmonary/Chest: Breath sounds normal. No respiratory distress. He has no wheezes. He has no rales.    CBC    Component Value Date/Time   WBC 5.4 04/30/2014 1500   RBC 4.72 04/30/2014 1500   HGB 14.8 04/30/2014 1500   HCT 42.5 04/30/2014 1500   PLT 191 04/30/2014 1500   MCV 90.0 04/30/2014 1500   LYMPHSABS 2.6 04/30/2014 1500   MONOABS 0.6 04/30/2014 1500   EOSABS 0.3 04/30/2014 1500   BASOSABS 0.1 04/30/2014 1500    CMP     Component Value Date/Time   NA 138 04/30/2014 1500   K 4.5 04/30/2014 1500   CL 102 04/30/2014 1500   CO2 31 04/30/2014 1500   GLUCOSE 72 04/30/2014 1500   BUN 15 04/30/2014 1500   CREATININE 0.98 04/30/2014 1500   CREATININE 0.83 03/10/2014 1545   CALCIUM 9.1 04/30/2014 1500   PROT 7.5 04/30/2014 1500   ALBUMIN 4.2 04/30/2014 1500   AST 46* 04/30/2014 1500   ALT 78* 04/30/2014  1500   ALKPHOS 62 04/30/2014 1500   BILITOT 0.8 04/30/2014 1500   GFRNONAA 83 04/30/2014 1500   GFRNONAA >90 03/10/2014 1545   GFRAA >89 04/30/2014 1500   GFRAA >90 03/10/2014 1545    No results found for this basename: chol, tri, ldl    No components found with this basename: hga1c    Lab Results  Component Value Date/Time   AST 46* 04/30/2014  3:00 PM    Assessment and Plan  HYPERTENSION  blood pressure is improved compared to last visit, he will continue with his current medications also advise for DASH diet.  TOBACCO USE  advised patient to quit smoking. As per patient he has cut down compared to last time.  History of hepatitis C  following up with ID.   Return in about 3 months (around 09/03/2014) for hypertension.  Lorayne Marek, MD

## 2014-06-03 NOTE — Patient Instructions (Signed)
DASH Eating Plan  DASH stands for "Dietary Approaches to Stop Hypertension." The DASH eating plan is a healthy eating plan that has been shown to reduce high blood pressure (hypertension). Additional health benefits may include reducing the risk of type 2 diabetes mellitus, heart disease, and stroke. The DASH eating plan may also help with weight loss.  WHAT DO I NEED TO KNOW ABOUT THE DASH EATING PLAN?  For the DASH eating plan, you will follow these general guidelines:  · Choose foods with a percent daily value for sodium of less than 5% (as listed on the food label).  · Use salt-free seasonings or herbs instead of table salt or sea salt.  · Check with your health care provider or pharmacist before using salt substitutes.  · Eat lower-sodium products, often labeled as "lower sodium" or "no salt added."  · Eat fresh foods.  · Eat more vegetables, fruits, and low-fat dairy products.  · Choose whole grains. Look for the word "whole" as the first word in the ingredient list.  · Choose fish and skinless chicken or turkey more often than red meat. Limit fish, poultry, and meat to 6 oz (170 g) each day.  · Limit sweets, desserts, sugars, and sugary drinks.  · Choose heart-healthy fats.  · Limit cheese to 1 oz (28 g) per day.  · Eat more home-cooked food and less restaurant, buffet, and fast food.  · Limit fried foods.  · Cook foods using methods other than frying.  · Limit canned vegetables. If you do use them, rinse them well to decrease the sodium.  · When eating at a restaurant, ask that your food be prepared with less salt, or no salt if possible.  WHAT FOODS CAN I EAT?  Seek help from a dietitian for individual calorie needs.  Grains  Whole grain or whole wheat bread. Brown rice. Whole grain or whole wheat pasta. Quinoa, bulgur, and whole grain cereals. Low-sodium cereals. Corn or whole wheat flour tortillas. Whole grain cornbread. Whole grain crackers. Low-sodium crackers.  Vegetables  Fresh or frozen vegetables  (raw, steamed, roasted, or grilled). Low-sodium or reduced-sodium tomato and vegetable juices. Low-sodium or reduced-sodium tomato sauce and paste. Low-sodium or reduced-sodium canned vegetables.   Fruits  All fresh, canned (in natural juice), or frozen fruits.  Meat and Other Protein Products  Ground beef (85% or leaner), grass-fed beef, or beef trimmed of fat. Skinless chicken or turkey. Ground chicken or turkey. Pork trimmed of fat. All fish and seafood. Eggs. Dried beans, peas, or lentils. Unsalted nuts and seeds. Unsalted canned beans.  Dairy  Low-fat dairy products, such as skim or 1% milk, 2% or reduced-fat cheeses, low-fat ricotta or cottage cheese, or plain low-fat yogurt. Low-sodium or reduced-sodium cheeses.  Fats and Oils  Tub margarines without trans fats. Light or reduced-fat mayonnaise and salad dressings (reduced sodium). Avocado. Safflower, olive, or canola oils. Natural peanut or almond butter.  Other  Unsalted popcorn and pretzels.  The items listed above may not be a complete list of recommended foods or beverages. Contact your dietitian for more options.  WHAT FOODS ARE NOT RECOMMENDED?  Grains  White bread. White pasta. White rice. Refined cornbread. Bagels and croissants. Crackers that contain trans fat.  Vegetables  Creamed or fried vegetables. Vegetables in a cheese sauce. Regular canned vegetables. Regular canned tomato sauce and paste. Regular tomato and vegetable juices.  Fruits  Dried fruits. Canned fruit in light or heavy syrup. Fruit juice.  Meat and Other Protein   Products  Fatty cuts of meat. Ribs, chicken wings, bacon, sausage, bologna, salami, chitterlings, fatback, hot dogs, bratwurst, and packaged luncheon meats. Salted nuts and seeds. Canned beans with salt.  Dairy  Whole or 2% milk, cream, half-and-half, and cream cheese. Whole-fat or sweetened yogurt. Full-fat cheeses or blue cheese. Nondairy creamers and whipped toppings. Processed cheese, cheese spreads, or cheese  curds.  Condiments  Onion and garlic salt, seasoned salt, table salt, and sea salt. Canned and packaged gravies. Worcestershire sauce. Tartar sauce. Barbecue sauce. Teriyaki sauce. Soy sauce, including reduced sodium. Steak sauce. Fish sauce. Oyster sauce. Cocktail sauce. Horseradish. Ketchup and mustard. Meat flavorings and tenderizers. Bouillon cubes. Hot sauce. Tabasco sauce. Marinades. Taco seasonings. Relishes.  Fats and Oils  Butter, stick margarine, lard, shortening, ghee, and bacon fat. Coconut, palm kernel, or palm oils. Regular salad dressings.  Other  Pickles and olives. Salted popcorn and pretzels.  The items listed above may not be a complete list of foods and beverages to avoid. Contact your dietitian for more information.  WHERE CAN I FIND MORE INFORMATION?  National Heart, Lung, and Blood Institute: www.nhlbi.nih.gov/health/health-topics/topics/dash/  Document Released: 11/03/2011 Document Revised: 11/19/2013 Document Reviewed: 09/18/2013  ExitCare® Patient Information ©2015 [employer]. This information is not intended to replace advice given to you by your health care provider. Make sure you discuss any questions you have with your health care provider.

## 2014-06-03 NOTE — Progress Notes (Signed)
Patient here for follow up on his hypertension

## 2014-07-31 ENCOUNTER — Encounter: Payer: Self-pay | Admitting: Internal Medicine

## 2014-07-31 ENCOUNTER — Ambulatory Visit (INDEPENDENT_AMBULATORY_CARE_PROVIDER_SITE_OTHER): Payer: No Typology Code available for payment source | Admitting: Internal Medicine

## 2014-07-31 ENCOUNTER — Ambulatory Visit: Payer: No Typology Code available for payment source | Attending: Internal Medicine

## 2014-07-31 VITALS — BP 167/100 | HR 67 | Temp 98.0°F | Wt 174.0 lb

## 2014-07-31 DIAGNOSIS — B182 Chronic viral hepatitis C: Secondary | ICD-10-CM

## 2014-07-31 DIAGNOSIS — Z23 Encounter for immunization: Secondary | ICD-10-CM

## 2014-07-31 DIAGNOSIS — K7460 Unspecified cirrhosis of liver: Secondary | ICD-10-CM

## 2014-07-31 MED ORDER — LEDIPASVIR-SOFOSBUVIR 90-400 MG PO TABS: 1.0000 | ORAL_TABLET | Freq: Every day | ORAL | Status: DC

## 2014-07-31 NOTE — Addendum Note (Signed)
Addended by: Myrtis Hopping A on: 07/31/2014 05:24 PM   Modules accepted: Orders

## 2014-07-31 NOTE — Assessment & Plan Note (Signed)
Told to absolutely abstain from alcohol which he is doing.  Will need screening ultrasound every 6 months for [facility] screening.  GI if/when able.

## 2014-07-31 NOTE — Assessment & Plan Note (Signed)
Will send in Kerr and see if we can get it through pt assistance through [facility].

## 2014-07-31 NOTE — Patient Instructions (Signed)
Patient information: Cirrhosis (The Basics)Written by the doctors and editors at UpToDate  What is cirrhosis? - Cirrhosis is a disease that scars the liver. The liver is a big organ in the upper right side of the belly (figure 1). Damage to the liver can cause heavy bleeding, swelling, and breathing problems.  What are the symptoms of cirrhosis? - Some people with cirrhosis have no symptoms. When symptoms do occur, they can include: ?Swelling in the belly and legs, and fluid buildup in the lungs  ?Heavy bleeding from blood vessels in the esophagus, the tube that connects the mouth to the stomach  ?Bruising or bleeding easily ?Trouble breathing ?Feeling full  ?Feeling tired ?Trouble getting enough sleep or sleeping too much ?Yellowing of the skin or whites of the eyes, called jaundice ?Confusion that can come on suddenly ?Coma Cirrhosis also makes it more likely that you will get infections, and it can increase your risk of liver cancer.  What causes cirrhosis? - When something harms the liver, the organ tries to fix itself. In the process, scars form. Causes of liver damage include: ?Heavy alcohol use - People who abuse alcohol or who are addicted to it are most at risk for cirrhosis.  ?Hepatitis B or hepatitis C - Viruses cause these liver diseases. People can catch the viruses by sharing needles or having sex with people who are infected.  ?Nonalcoholic steatohepatitis (NASH) - People with this condition often don't drink alcohol. Doctors aren't sure what causes NASH, but many people who have it are obese and have diabetes. Is there a test for cirrhosis? - Yes. Tests include: ?Biopsy - In this test, a doctor puts a needle into your liver and takes out a small sample of tissue. The sample will show how severe the damage is.  ?Blood tests - Results can show what is causing the disease. ?Imaging - Your doctor might take pictures of your liver with an ultrasound machine or with a MRI. Is there  anything I can do to prevent further liver damage? - Yes. To help protect your liver: ?Avoid alcohol ?Talk to your doctor before you start taking any new medicines, including pain killers such as ibuprofen (sample brand names: Advil, Motrin), naproxen (sample brand name: Aleve), or acetaminophen (sample brand name: Tylenol). Also talk to your doctor before taking any herbs, vitamins, or supplements. Some medicines and supplements can damage the liver.  ?Get vaccinated against hepatitis A and B if you have not had the infections before How is cirrhosis treated? - Treatments depend on the cause of cirrhosis, how severe it is, and what symptoms you have. Treatments fall into a few main categories, including those that: ?Treat the cause of the disease - Some causes of cirrhosis can be treated. One example is stopping alcohol in people with cirrhosis caused by alcohol abuse. Another example is treatment of chronic hepatitis C or B with medicines.  ?Lower the risk of bleeding - Cirrhosis can cause the blood vessels around the esophagus to swell or even burst and bleed (figure 2). To prevent that from happening, doctors can: .Prescribe medicines called "beta blockers." These medicines reduce blood pressure in the liver, and help reduce the chance of bleeding. .Tie tiny bands around the swollen blood vessels (this procedure is called "variceal band ligation") ?Decrease fluid buildup in the belly - In people with cirrhosis, the belly sometimes fills with fluid. To decrease fluid buildup, doctors can:  .Prescribe medicines called "diuretics." These medicines help you pee out the extra  fluid. People who take diuretic medicines often must also reduce the amount of salt they eat. Niel Hummer the fluid from your belly using a needle (this procedure is called a "paracentesis") .Implant a device in the liver that reduces fluid buildup in the belly (this procedure is called "TIPS") ?Treat or prevent infection - People with  cirrhosis have a higher than normal chance of getting infections. When they get an infection, they can also get much sicker than people without cirrhosis. As a result, people with cirrhosis sometimes need antibiotics to either treat or prevent infection. Most people with cirrhosis should also get the flu vaccine and other vaccines to prevent common infections. ?Improve confusion - Advanced cirrhosis can lead to confusion. Doctors usually use lactulose (a medicine that softens stool) or certain antibiotics to treat the confusion.  Will I need a new liver? - People with severe cirrhosis need a new liver. Talk to your doctor about the surgery before you get too sick, to find out if a liver transplant might be an option for you. That way you can get on the waiting list early enough. People often have to wait for up to 2 years to get a new liver. Can cirrhosis be prevented? - You can reduce your chances of getting cirrhosis by: ?Getting help if you have an alcohol problem ?Getting the vaccines for hepatitis B and hepatitis A, if you haven't already ?Using condoms when having sex ?Not sharing drug needles

## 2014-07-31 NOTE — Progress Notes (Signed)
   Subjective:    Patient ID: James Avila, male    DOB: 12-May-1951, 63 y.o.   MRN: [id number]  HPI Here for follow up of HCV.  Genotype 1a, never treated, elastography with F4.  Stopped drinking.  Hep B immune.  Trying to quit smoking as well.     Review of Systems  Constitutional: Negative for fatigue.  Gastrointestinal: Negative for nausea and diarrhea.  Neurological: Negative for dizziness and light-headedness.       Objective:   Physical Exam        Assessment & Plan:

## 2014-09-15 ENCOUNTER — Ambulatory Visit: Payer: Self-pay | Attending: Internal Medicine | Admitting: Internal Medicine

## 2014-09-15 DIAGNOSIS — F17210 Nicotine dependence, cigarettes, uncomplicated: Secondary | ICD-10-CM | POA: Insufficient documentation

## 2014-09-15 DIAGNOSIS — I10 Essential (primary) hypertension: Secondary | ICD-10-CM | POA: Insufficient documentation

## 2014-09-15 DIAGNOSIS — R200 Anesthesia of skin: Secondary | ICD-10-CM | POA: Insufficient documentation

## 2014-09-15 NOTE — Progress Notes (Signed)
Patient presents for 6 month history numbness in lower lip and chin, elevated BP, headaches, and "pressure" in neck Also c/o "throbbing" chest pain lasting the entire day 1-2 days each week for 6 months. Denies chest pain at present; states last episode of chest pain was 4 days ago lasting entire day; rated 6/10 States down to smoking 5 cigs per day  BP 146/80 left arm manually P 74 T 98.3 oral SPO2 98%  Patient appears in no apparent distress. Appt with PCP scheduled for tomorrow, 09/16/14 at 5 pm Patient instructed to continue taking BP as previously directed  Agree with RN's assessment and intervention  Lorayne Marek, MD

## 2014-09-16 ENCOUNTER — Ambulatory Visit: Payer: Self-pay | Attending: Internal Medicine | Admitting: Internal Medicine

## 2014-09-16 ENCOUNTER — Encounter: Payer: Self-pay | Admitting: Internal Medicine

## 2014-09-16 ENCOUNTER — Ambulatory Visit (HOSPITAL_COMMUNITY)
Admission: RE | Admit: 2014-09-16 | Discharge: 2014-09-16 | Disposition: A | Discharge status: Home / Self Care | Payer: Self-pay | Source: Ambulatory Visit | Attending: Internal Medicine | Admitting: Internal Medicine

## 2014-09-16 ENCOUNTER — Other Ambulatory Visit: Payer: Self-pay

## 2014-09-16 ENCOUNTER — Ambulatory Visit: Payer: Self-pay | Attending: Internal Medicine

## 2014-09-16 VITALS — BP 160/90 | HR 79 | Temp 98.0°F | Resp 16 | Wt 180.2 lb

## 2014-09-16 DIAGNOSIS — Z8249 Family history of ischemic heart disease and other diseases of the circulatory system: Secondary | ICD-10-CM

## 2014-09-16 DIAGNOSIS — F17200 Nicotine dependence, unspecified, uncomplicated: Secondary | ICD-10-CM

## 2014-09-16 DIAGNOSIS — I10 Essential (primary) hypertension: Secondary | ICD-10-CM

## 2014-09-16 DIAGNOSIS — Z7982 Long term (current) use of aspirin: Secondary | ICD-10-CM | POA: Insufficient documentation

## 2014-09-16 DIAGNOSIS — IMO0001 Reserved for inherently not codable concepts without codable children: Secondary | ICD-10-CM

## 2014-09-16 DIAGNOSIS — Z720 Tobacco use: Secondary | ICD-10-CM | POA: Insufficient documentation

## 2014-09-16 DIAGNOSIS — R079 Chest pain, unspecified: Secondary | ICD-10-CM

## 2014-09-16 DIAGNOSIS — F419 Anxiety disorder, unspecified: Secondary | ICD-10-CM | POA: Insufficient documentation

## 2014-09-16 DIAGNOSIS — R0789 Other chest pain: Secondary | ICD-10-CM

## 2014-09-16 DIAGNOSIS — B1920 Unspecified viral hepatitis C without hepatic coma: Secondary | ICD-10-CM | POA: Insufficient documentation

## 2014-09-16 DIAGNOSIS — R030 Elevated blood-pressure reading, without diagnosis of hypertension: Secondary | ICD-10-CM

## 2014-09-16 MED ORDER — LISINOPRIL-HYDROCHLOROTHIAZIDE 20-25 MG PO TABS: 2.0000 | ORAL_TABLET | Freq: Every day | ORAL | Status: DC

## 2014-09-16 MED ORDER — CLONIDINE HCL 0.1 MG PO TABS
0.1000 mg | ORAL_TABLET | Freq: Once | ORAL | Status: AC
  Administered 2014-09-16: 0.1 mg via ORAL

## 2014-09-16 NOTE — Patient Instructions (Signed)
DASH Eating Plan °DASH stands for "Dietary Approaches to Stop Hypertension." The DASH eating plan is a healthy eating plan that has been shown to reduce high blood pressure (hypertension). Additional health benefits may include reducing the risk of type 2 diabetes mellitus, heart disease, and stroke. The DASH eating plan may also help with weight loss. °WHAT DO I NEED TO KNOW ABOUT THE DASH EATING PLAN? °For the DASH eating plan, you will follow these general guidelines: °· Choose foods with a percent daily value for sodium of less than 5% (as listed on the food label). °· Use salt-free seasonings or herbs instead of table salt or sea salt. °· Check with your health care provider or pharmacist before using salt substitutes. °· Eat lower-sodium products, often labeled as "lower sodium" or "no salt added." °· Eat fresh foods. °· Eat more vegetables, fruits, and low-fat dairy products. °· Choose whole grains. Look for the word "whole" as the first word in the ingredient list. °· Choose fish and skinless chicken or turkey more often than red meat. Limit fish, poultry, and meat to 6 oz (170 g) each day. °· Limit sweets, desserts, sugars, and sugary drinks. °· Choose heart-healthy fats. °· Limit cheese to 1 oz (28 g) per day. °· Eat more home-cooked food and less restaurant, buffet, and fast food. °· Limit fried foods. °· Cook foods using methods other than frying. °· Limit canned vegetables. If you do use them, rinse them well to decrease the sodium. °· When eating at a restaurant, ask that your food be prepared with less salt, or no salt if possible. °WHAT FOODS CAN I EAT? °Seek help from a dietitian for individual calorie needs. °Grains °Whole grain or whole wheat bread. Brown rice. Whole grain or whole wheat pasta. Quinoa, bulgur, and whole grain cereals. Low-sodium cereals. Corn or whole wheat flour tortillas. Whole grain cornbread. Whole grain crackers. Low-sodium crackers. °Vegetables °Fresh or frozen vegetables  (raw, steamed, roasted, or grilled). Low-sodium or reduced-sodium tomato and vegetable juices. Low-sodium or reduced-sodium tomato sauce and paste. Low-sodium or reduced-sodium canned vegetables.  °Fruits °All fresh, canned (in natural juice), or frozen fruits. °Meat and Other Protein Products °Ground beef (85% or leaner), grass-fed beef, or beef trimmed of fat. Skinless chicken or turkey. Ground chicken or turkey. Pork trimmed of fat. All fish and seafood. Eggs. Dried beans, peas, or lentils. Unsalted nuts and seeds. Unsalted canned beans. °Dairy °Low-fat dairy products, such as skim or 1% milk, 2% or reduced-fat cheeses, low-fat ricotta or cottage cheese, or plain low-fat yogurt. Low-sodium or reduced-sodium cheeses. °Fats and Oils °Tub margarines without trans fats. Light or reduced-fat mayonnaise and salad dressings (reduced sodium). Avocado. Safflower, olive, or canola oils. Natural peanut or almond butter. °Other °Unsalted popcorn and pretzels. °The items listed above may not be a complete list of recommended foods or beverages. Contact your dietitian for more options. °WHAT FOODS ARE NOT RECOMMENDED? °Grains °White bread. White pasta. White rice. Refined cornbread. Bagels and croissants. Crackers that contain trans fat. °Vegetables °Creamed or fried vegetables. Vegetables in a cheese sauce. Regular canned vegetables. Regular canned tomato sauce and paste. Regular tomato and vegetable juices. °Fruits °Dried fruits. Canned fruit in light or heavy syrup. Fruit juice. °Meat and Other Protein Products °Fatty cuts of meat. Ribs, chicken wings, bacon, sausage, bologna, salami, chitterlings, fatback, hot dogs, bratwurst, and packaged luncheon meats. Salted nuts and seeds. Canned beans with salt. °Dairy °Whole or 2% milk, cream, half-and-half, and cream cheese. Whole-fat or sweetened yogurt. Full-fat   cheeses or blue cheese. Nondairy creamers and whipped toppings. Processed cheese, cheese spreads, or cheese  curds. °Condiments °Onion and garlic salt, seasoned salt, table salt, and sea salt. Canned and packaged gravies. Worcestershire sauce. Tartar sauce. Barbecue sauce. Teriyaki sauce. Soy sauce, including reduced sodium. Steak sauce. Fish sauce. Oyster sauce. Cocktail sauce. Horseradish. Ketchup and mustard. Meat flavorings and tenderizers. Bouillon cubes. Hot sauce. Tabasco sauce. Marinades. Taco seasonings. Relishes. °Fats and Oils °Butter, stick margarine, lard, shortening, ghee, and bacon fat. Coconut, palm kernel, or palm oils. Regular salad dressings. °Other °Pickles and olives. Salted popcorn and pretzels. °The items listed above may not be a complete list of foods and beverages to avoid. Contact your dietitian for more information. °WHERE CAN I FIND MORE INFORMATION? °National Heart, Lung, and Blood Institute: www.nhlbi.nih.gov/health/health-topics/topics/dash/ °Document Released: 11/03/2011 Document Revised: 03/31/2014 Document Reviewed: 09/18/2013 °ExitCare® Patient Information ©2015 [employer]. This information is not intended to replace advice given to you by your health care provider. Make sure you discuss any questions you have with your health care provider. ° °

## 2014-09-16 NOTE — Progress Notes (Signed)
Patient states his blood pressure has been running high Complains of chest pain off and on Sometimes his face gets numb around his mouth area

## 2014-09-16 NOTE — Progress Notes (Signed)
MRN: [id number] Name: James Avila  Sex: male Age: 63 y.o. DOB: 05/06/1951  Allergies: Hydralazine  Chief Complaint  Patient presents with  . Hypertension    HPI: Patient is 63 y.o. male who has history of hypertension and hepatitis C, anxiety comes today reported to have on and off chest pain which is sharp without radiation to the neck or arm occasionally associated with exertion, as per patient when his blood pressure is high he feels some numbness around his mouth, patient had similar symptoms in April was seen in the ED had a CT scan done which was negative, troponins were also negative EKG today in the office shows normal sinus rhythm at 69 beats per minute has some lateral ST-T changes compared to last EKG in April patient does report family history of  heart disease as well as he smokes cigarettes, his blood pressure today was elevated as per patient is compliant in taking his blood pressure medications currently on lisinopril/hydrochlorothiazide as was Coreg  also taking aspirin at present patient denies any chest. Denies any [place] 20 edema denies any orthopnea or PND.   Past Medical History  Diagnosis Date  . Hypertension   . TOBACCO USE 09/07/2007    Qualifier: Diagnosis of  By: Leanne Chang MD, Bruce    . HEPATITIS C 05/10/2007    Qualifier: Diagnosis of  By: Leanne Chang MD, Bruce    . BPH (benign prostatic hyperplasia) 10/10/2013  . Anxiety state, unspecified 10/10/2013    Past Surgical History  Procedure Laterality Date  . Tonsillectomy        Medication List       This list is accurate as of: 09/16/14  5:59 PM.  Always use your most recent med list.               aspirin 81 MG tablet  Take 81 mg by mouth daily.     carvedilol 6.25 MG tablet  Commonly known as:  COREG  Take 1 tablet (6.25 mg total) by mouth 2 (two) times daily with a meal.     Ledipasvir-Sofosbuvir 90-400 MG Tabs  Commonly known as:  HARVONI  Take 1 tablet by mouth daily.     lisinopril-hydrochlorothiazide 20-25 MG per tablet  Commonly known as:  PRINZIDE,ZESTORETIC  Take 2 tablets by mouth daily.     sertraline 25 MG tablet  Commonly known as:  ZOLOFT  Take 25 mg by mouth daily.        Meds ordered this encounter  Medications  . cloNIDine (CATAPRES) tablet 0.1 mg    Sig:   . lisinopril-hydrochlorothiazide (PRINZIDE,ZESTORETIC) 20-25 MG per tablet    Sig: Take 2 tablets by mouth daily.    Dispense:  60 tablet    Refill:  3    Immunization History  Administered Date(s) Administered  . Hepatitis A, Adult 07/31/2014  . Pneumococcal Polysaccharide-23 07/31/2014    Family History  Problem Relation Age of Onset  . Hypertension Mother   . Hydrocephalus Mother   . Stroke Mother   . Diabetes Mother   . Cancer Father 56    skin     History  Substance Use Topics  . Smoking status: Current Every Day Smoker -- 0.50 packs/day    Types: Cigarettes  . Smokeless tobacco: Never Used     Comment: cutting back  . Alcohol Use: No    Review of Systems   As noted in HPI  Filed Vitals:   09/16/14 1754  BP: 160/90  Pulse:   Temp:   Resp:     Physical Exam  Physical Exam  Constitutional: No distress.  Eyes: EOM are normal. Pupils are equal, round, and reactive to light.  Cardiovascular: Normal rate and regular rhythm.   Pulmonary/Chest: Breath sounds normal. No respiratory distress. He has no wheezes. He has no rales.  Musculoskeletal: He exhibits no edema.  Neurological: He has normal reflexes.  Equal strength all extremities     CBC    Component Value Date/Time   WBC 5.4 04/30/2014 1500   RBC 4.72 04/30/2014 1500   HGB 14.8 04/30/2014 1500   HCT 42.5 04/30/2014 1500   PLT 191 04/30/2014 1500   MCV 90.0 04/30/2014 1500   LYMPHSABS 2.6 04/30/2014 1500   MONOABS 0.6 04/30/2014 1500   EOSABS 0.3 04/30/2014 1500   BASOSABS 0.1 04/30/2014 1500    CMP     Component Value Date/Time   NA 138 04/30/2014 1500   K 4.5 04/30/2014 1500   CL 102 04/30/2014 1500     CO2 31 04/30/2014 1500   GLUCOSE 72 04/30/2014 1500   BUN 15 04/30/2014 1500   CREATININE 0.98 04/30/2014 1500   CREATININE 0.83 03/10/2014 1545   CALCIUM 9.1 04/30/2014 1500   PROT 7.5 04/30/2014 1500   ALBUMIN 4.2 04/30/2014 1500   AST 46* 04/30/2014 1500   ALT 78* 04/30/2014 1500   ALKPHOS 62 04/30/2014 1500   BILITOT 0.8 04/30/2014 1500   GFRNONAA 83 04/30/2014 1500   GFRNONAA >90 03/10/2014 1545   GFRAA >89 04/30/2014 1500   GFRAA >90 03/10/2014 1545    No results found for this basename: chol, tri, ldl    No components found with this basename: hga1c    Lab Results  Component Value Date/Time   AST 46* 04/30/2014  3:00 PM    Assessment and Plan  Elevated blood pressure - Plan: cloNIDine (CATAPRES) tablet 0.1 mg, repeat manual blood pressure is 160/90  Exertional chest pain Other chest pain - Plan: EKG 12-Lead some light flow ST-T changes and lateral leads compared to previous, ordered Troponin I, we'll check fasting Lipid panel  Essential hypertension - Plan: Pressure is uncontrolled, I have increased the dose take 2 pills lisinopril-hydrochlorothiazide (PRINZIDE,ZESTORETIC) 20-25 MG per tablet and continue with Coreg twice a day, COMPLETE METABOLIC PANEL WITH GFR   Family history of heart disease - Plan: Ambulatory referral to Cardiology  Smoking  I have again counseled patient to quit smoking.   Health Maintenance -patient declines flu shot   Return in about 3 months (around 12/17/2014) for hypertension, BP check and blood work in 2 weeks/Nurse Visit.  Lorayne Marek, MD

## 2014-09-17 LAB — TROPONIN I

## 2014-10-07 ENCOUNTER — Ambulatory Visit: Payer: Self-pay | Attending: Internal Medicine | Admitting: *Deleted

## 2014-10-07 ENCOUNTER — Other Ambulatory Visit: Payer: Self-pay | Admitting: Internal Medicine

## 2014-10-07 VITALS — BP 122/77 | HR 66 | Temp 98.0°F

## 2014-10-07 DIAGNOSIS — I10 Essential (primary) hypertension: Secondary | ICD-10-CM

## 2014-10-07 NOTE — Progress Notes (Signed)
Patient presents for BP check States taking carvedilol 1 tab po bid and Lisinopril 1 tab po bid States he didn't realize he was to take lisinopril 2 tabs daily and  carvedilol 1 tab bid States he feels well Denies headaches, pressure in neck, chest pain and chest pressure States these symptoms stopped after 3-4 days of beginning both meds bid States smoking 5 cigs/day but continues to try and quit.  Patient notified that troponin level taken at last visit was normal  BP 122/77 P 66 SPO2 96%  Per PCP patient to continue to take carvedilol 1 tab po bid and Lisinopril 1 tab po bid Change noted on med list

## 2014-10-08 ENCOUNTER — Ambulatory Visit: Payer: Self-pay | Attending: Internal Medicine

## 2014-10-08 DIAGNOSIS — I10 Essential (primary) hypertension: Secondary | ICD-10-CM

## 2014-10-08 DIAGNOSIS — R0789 Other chest pain: Secondary | ICD-10-CM

## 2014-10-08 LAB — COMPLETE METABOLIC PANEL WITH GFR
ALT: 88 U/L — AB (ref 0–53)
AST: 52 U/L — ABNORMAL HIGH (ref 0–37)
Albumin: 4 g/dL (ref 3.5–5.2)
Alkaline Phosphatase: 50 U/L (ref 39–117)
BILIRUBIN TOTAL: 0.6 mg/dL (ref 0.2–1.2)
BUN: 20 mg/dL (ref 6–23)
CO2: 28 meq/L (ref 19–32)
CREATININE: 1.01 mg/dL (ref 0.50–1.35)
Calcium: 9.4 mg/dL (ref 8.4–10.5)
Chloride: 98 mEq/L (ref 96–112)
GFR, Est Non African American: 79 mL/min
Glucose, Bld: 98 mg/dL (ref 70–99)
Potassium: 3.9 mEq/L (ref 3.5–5.3)
SODIUM: 136 meq/L (ref 135–145)
TOTAL PROTEIN: 7.3 g/dL (ref 6.0–8.3)

## 2014-10-08 LAB — LIPID PANEL
Cholesterol: 179 mg/dL (ref 0–200)
HDL: 36 mg/dL — AB (ref >39)
LDL CALC: 115 mg/dL — AB (ref 0–99)
Total CHOL/HDL Ratio: 5 Ratio
Triglycerides: 141 mg/dL (ref <150)
VLDL: 28 mg/dL (ref 0–40)

## 2014-10-15 ENCOUNTER — Telehealth: Payer: Self-pay | Admitting: Internal Medicine

## 2014-10-15 ENCOUNTER — Telehealth: Payer: Self-pay | Admitting: Emergency Medicine

## 2014-10-15 DIAGNOSIS — I10 Essential (primary) hypertension: Secondary | ICD-10-CM

## 2014-10-15 MED ORDER — LISINOPRIL-HYDROCHLOROTHIAZIDE 20-25 MG PO TABS: 2.0000 | ORAL_TABLET | Freq: Every day | ORAL | Status: DC

## 2014-10-15 NOTE — Telephone Encounter (Signed)
Medication reordered; e-scribed to [place]

## 2014-10-15 NOTE — Telephone Encounter (Signed)
Patient is calling to request a medication refill for his BP medicine. Please assist.

## 2014-10-31 ENCOUNTER — Encounter: Payer: Self-pay | Admitting: *Deleted

## 2014-11-04 ENCOUNTER — Encounter: Payer: Self-pay | Admitting: Cardiology

## 2014-11-04 ENCOUNTER — Ambulatory Visit (INDEPENDENT_AMBULATORY_CARE_PROVIDER_SITE_OTHER): Payer: No Typology Code available for payment source | Admitting: Cardiology

## 2014-11-04 VITALS — BP 128/86 | HR 75 | Ht 71.0 in | Wt 183.0 lb

## 2014-11-04 DIAGNOSIS — R079 Chest pain, unspecified: Secondary | ICD-10-CM

## 2014-11-04 DIAGNOSIS — B182 Chronic viral hepatitis C: Secondary | ICD-10-CM

## 2014-11-04 DIAGNOSIS — I10 Essential (primary) hypertension: Secondary | ICD-10-CM

## 2014-11-04 MED ORDER — AMLODIPINE BESYLATE 5 MG PO TABS: 5.0000 mg | ORAL_TABLET | Freq: Every day | ORAL | Status: DC

## 2014-11-04 NOTE — Progress Notes (Signed)
James Avila Date of Birth:  21-Jun-1951 [facility] [address] [place], [place]  [address] [phone]        Fax   [phone]   History of Present Illness: This pleasant 63 year old gentleman is seen by me for the first time today.  He is seen at the request of Dr. Annitta Needs.  He is seen because of concern about some intermittent chest discomfort.  The patient has no documented past history of heart problems.  He does have a past history of high blood pressure dating back 20 or 30 years.  He has had high cholesterol.  He continues to smoke about 5 cigarettes a day.  He states that he had a stress test about 15 years ago which he thinks was normal.  Recently he has been experiencing some sharp left-sided chest pain worse with exertion.  He is also had occasional intermittent numbness of the mouth and lips primarily on the left side.  He has not been having any left arm radiation. His family history reveals that his mother had coronary artery bypass graft surgery and is living but is in a nursing home.  His father died at age 38 and had dementia. 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  He is hoping to get disability.  He has a chronic back problem.  He also has hepatitis C which she contracted at about age 40. He had an emergency room trip on 09/16/14 in which she went to the emergency room with chest pain.  His troponin was normal and it was felt to be a reaction to hydralazine which was subsequently stopped.  Current Outpatient Prescriptions  Medication Sig Dispense Refill  . aspirin 81 MG tablet Take 81 mg by mouth daily.    . carvedilol (COREG) 6.25 MG tablet TAKE 1 TABLET BY MOUTH TWICE DAILY WITH A MEAL 180 tablet 0  . lisinopril-hydrochlorothiazide (PRINZIDE,ZESTORETIC) 20-25  MG per tablet Take 2 tablets by mouth daily. 60 tablet 3  . sertraline (ZOLOFT) 25 MG tablet Take 25 mg by mouth daily.    Marland Kitchen amLODipine (NORVASC) 5 MG tablet Take 1 tablet (5 mg total) by mouth daily. 30 tablet 11   No current facility-administered medications for this visit.    Allergies  Allergen Reactions  . Hydralazine     Causes numbness Increased blood pressure    Patient Active Problem List   Diagnosis Date Noted  . Chest pain 11/04/2014  . Cirrhosis of liver without mention of alcohol 07/31/2014  . BPH (benign prostatic hyperplasia) 10/10/2013  . Anxiety state, unspecified 10/10/2013  . Chronic back pain 10/10/2013  . TOBACCO USE 09/07/2007  . Hepatitis C virus infection without hepatic coma 05/10/2007  . HYPERTENSION 05/10/2007    History  Smoking status  . Current Every Day Smoker -- 0.50 packs/day  . Types: Cigarettes  Smokeless tobacco  . Never Used    Comment: Smoking 5 cigs/day    History  Alcohol Use No    Family History  Problem Relation Age of Onset  . Hypertension Mother   . Hydrocephalus Mother   . Stroke Mother   . Diabetes Mother   . Cancer Father 36    skin     Review of Systems: Constitutional: no  fever chills diaphoresis or fatigue or change in weight.  Head and neck: no hearing loss, no epistaxis, no photophobia or visual disturbance. Respiratory: No cough, shortness of breath or wheezing. Cardiovascular: No chest pain peripheral edema, palpitations. Gastrointestinal: No abdominal distention, no abdominal pain, no change in bowel habits hematochezia or melena. Genitourinary: No dysuria, no frequency, no urgency, no nocturia. Musculoskeletal:No arthralgias, no back pain, no gait disturbance or myalgias. Neurological: No dizziness, no headaches, no numbness, no seizures, no syncope, no weakness, no tremors. Hematologic: No lymphadenopathy, no easy bruising. Psychiatric: No confusion, no hallucinations, no sleep disturbance.   Wt  Readings from Last 3 Encounters:  11/04/14 183 lb (83.008 kg)  09/16/14 180 lb 3.2 oz (81.738 kg)  07/31/14 174 lb (78.926 kg)    Physical Exam: Filed Vitals:   11/04/14 1442  BP: 128/86  Pulse: 75  The patient appears to be in no distress.  Head and neck exam reveals that the pupils are equal and reactive.  The extraocular movements are full.  There is no scleral icterus.  Mouth and pharynx are benign.  No lymphadenopathy.  No carotid bruits.  The jugular venous pressure is normal.  Thyroid is not enlarged or tender.  Chest is clear to percussion and auscultation.  No rales or rhonchi.  Expansion of the chest is symmetrical.  Heart reveals no abnormal lift or heave.  First and second heart sounds are normal.  There is no murmur gallop rub or click.  The abdomen is soft and nontender.  Bowel sounds are normoactive.  There is no hepatosplenomegaly or mass.  There are no abdominal bruits.  Extremities reveal no phlebitis or edema.  Pedal pulses are good.  There is no cyanosis or clubbing.  Neurologic exam is normal strength and no lateralizing weakness.  No sensory deficits.  Integument reveals no rash  EKG today shows normal sinus rhythm with nonspecific T-wave changes in 1 and aVL  Assessment / Plan: 1.  Atypical chest pain 2.  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  Tobacco abuse 4.  Hypercholesterolemia 5.  Hepatitis C  Disposition: The patient will return for a treadmill Myoview stress test to evaluate his chest pain. His blood pressure control is not optimal.  He is intolerant of hydralazine which was recently tried.  We will give him a trial of amlodipine 5 mg one daily. Many thanks for the opportunity to see this pleasant gentleman with you.  We will plan to see him back on an as-needed basis here.

## 2014-11-04 NOTE — Patient Instructions (Signed)
START AMLODIPINE 5 MG DAILY, [facility]   Your physician has requested that you have en exercise stress myoview. For further information please visit [email or web address]. Please follow instruction sheet, as given.  FOLLOW UP AS NEEDED

## 2014-11-13 ENCOUNTER — Ambulatory Visit (HOSPITAL_COMMUNITY): Payer: No Typology Code available for payment source | Attending: Cardiology | Admitting: Radiology

## 2014-11-13 DIAGNOSIS — R0609 Other forms of dyspnea: Secondary | ICD-10-CM | POA: Insufficient documentation

## 2014-11-13 DIAGNOSIS — R079 Chest pain, unspecified: Secondary | ICD-10-CM | POA: Insufficient documentation

## 2014-11-13 DIAGNOSIS — I10 Essential (primary) hypertension: Secondary | ICD-10-CM | POA: Insufficient documentation

## 2014-11-13 DIAGNOSIS — R42 Dizziness and giddiness: Secondary | ICD-10-CM | POA: Insufficient documentation

## 2014-11-13 DIAGNOSIS — R0602 Shortness of breath: Secondary | ICD-10-CM

## 2014-11-13 DIAGNOSIS — Z6825 Body mass index (BMI) 25.0-25.9, adult: Secondary | ICD-10-CM | POA: Insufficient documentation

## 2014-11-13 DIAGNOSIS — Z136 Encounter for screening for cardiovascular disorders: Secondary | ICD-10-CM | POA: Insufficient documentation

## 2014-11-13 MED ORDER — TECHNETIUM TC 99M SESTAMIBI GENERIC - CARDIOLITE
11.0000 | Freq: Once | INTRAVENOUS | Status: AC | PRN
  Administered 2014-11-13: 11 via INTRAVENOUS

## 2014-11-13 MED ORDER — TECHNETIUM TC 99M SESTAMIBI GENERIC - CARDIOLITE
33.0000 | Freq: Once | INTRAVENOUS | Status: AC | PRN
  Administered 2014-11-13: 33 via INTRAVENOUS

## 2014-11-13 NOTE — Progress Notes (Signed)
[facility] [address] [place], [place] [address] [phone]    Cardiology Nuclear Med Study  James Avila is a 63 y.o. male     MRN : [id number]     DOB: [birth year]  Procedure Date: 11/13/2014  Nuclear Med Background Indication for Stress Test:  Evaluation for Ischemia and 10/15 ER visit- CP, Normal enzymes History:  No Known CAD History Cardiac Risk Factors: Hypertension  Symptoms:  Chest Pain, DOE and Light-Headedness   Nuclear Pre-Procedure Caffeine/Decaff Intake:  None NPO After: 12:00am   Lungs:  clear O2 Sat: 96% on room air. IV 0.9% NS with Angio Cath:  22g  IV Site: R Hand  IV Started by:  Crissie Figures, RN  Chest Size (in):  44 Cup Size: n/a  Height: 5\' 11"  (1.803 m)  Weight:  182 lb (82.555 kg)  BMI:  Body mass index is 25.4 kg/(m^2). Tech Comments:  N/A    Nuclear Med Study 1 or 2 day study: 1 day  Stress Test Type:  Stress  Reading MD: N/A  Order Authorizing Provider:  Darlin Coco, MD  Resting Radionuclide: Technetium 74m Sestamibi  Resting Radionuclide Dose: 11.0 mCi   Stress Radionuclide:  Technetium 60m Sestamibi  Stress Radionuclide Dose: 33.0 mCi           Stress Protocol Rest HR: 70 Stress HR: 139  Rest BP: 123/85 Stress BP: 185/90  Exercise Time (min): 8:15 METS: 8.80   Predicted Max HR: 158 bpm % Max HR: 87.97 bpm Rate Pressure Product: 14970   Dose of Adenosine (mg):  n/a Dose of Lexiscan: n/a mg  Dose of Atropine (mg): n/a Dose of Dobutamine: n/a mcg/kg/min (at max HR)  Stress Test Technologist: Ileene Hutchinson, EMT-P  Nuclear Technologist:  Earl Many, CNMT     Rest Procedure:  Myocardial perfusion imaging was performed at rest 45 minutes following the intravenous administration of Technetium 82m Sestamibi. Rest ECG: NSR - Normal EKG  Stress Procedure:  The patient exercised on the treadmill utilizing the Bruce Protocol for 8:15 minutes. The patient stopped due to  leg fatigue/sob and denied any  chest pain.  Technetium 26m Sestamibi was injected at peak exercise and myocardial perfusion imaging was performed after a brief delay. Stress ECG: No significant ST segment change suggestive of ischemia.  QPS Raw Data Images:  Mild diaphragmatic attenuation.  Normal left ventricular size. Stress Images:  There is decreased uptake in the inferior wall. Rest Images:  There is decreased uptake in the inferior wall. Subtraction (SDS):  There is a fixed inferior defect that is most consistent with diaphragmatic attenuation. Transient Ischemic Dilatation (Normal <1.22):  0.82 Lung/Heart Ratio (Normal <0.45):  0.27  Quantitative Gated Spect Images QGS EDV:  63 ml QGS ESV:  23 ml  Impression Exercise Capacity:  Good exercise capacity. BP Response:  Normal blood pressure response. Clinical Symptoms:  No significant symptoms noted. ECG Impression:  No significant ST segment change suggestive of ischemia. Comparison with Prior Nuclear Study: No previous nuclear study performed  Overall Impression:  Low risk stress nuclear study with small area of inferior attenuation artifact. No ischemia.  LV Ejection Fraction: 64%.  LV Wall Motion:  NL LV Function; NL Wall Motion   Pixie Casino, MD, [facility] Board Certified in Nuclear Cardiology Attending Cardiologist [place]

## 2014-11-17 ENCOUNTER — Telehealth: Payer: Self-pay | Admitting: *Deleted

## 2014-11-17 NOTE — Telephone Encounter (Signed)
-----   Message from Darlin Coco, MD sent at 11/14/2014  9:24 AM EST ----- Please report.  The stress test was normal.  No evidence of ischemia or scar.  The ejection fraction was normal at 64%.  Please send a copy of the report to PCP Dr. Annitta Needs.

## 2014-11-17 NOTE — Telephone Encounter (Signed)
Advised patient of results.  

## 2015-01-16 ENCOUNTER — Ambulatory Visit: Payer: No Typology Code available for payment source | Attending: Internal Medicine

## 2015-02-04 ENCOUNTER — Ambulatory Visit: Payer: No Typology Code available for payment source | Admitting: Internal Medicine

## 2015-02-12 ENCOUNTER — Encounter: Payer: Self-pay | Admitting: Internal Medicine

## 2015-02-12 ENCOUNTER — Ambulatory Visit: Payer: Self-pay | Attending: Internal Medicine | Admitting: Internal Medicine

## 2015-02-12 VITALS — BP 113/73 | HR 63 | Temp 98.0°F | Resp 16 | Wt 187.0 lb

## 2015-02-12 DIAGNOSIS — M79643 Pain in unspecified hand: Secondary | ICD-10-CM

## 2015-02-12 DIAGNOSIS — I10 Essential (primary) hypertension: Secondary | ICD-10-CM | POA: Insufficient documentation

## 2015-02-12 DIAGNOSIS — Z79899 Other long term (current) drug therapy: Secondary | ICD-10-CM | POA: Insufficient documentation

## 2015-02-12 DIAGNOSIS — F17210 Nicotine dependence, cigarettes, uncomplicated: Secondary | ICD-10-CM | POA: Insufficient documentation

## 2015-02-12 DIAGNOSIS — F419 Anxiety disorder, unspecified: Secondary | ICD-10-CM | POA: Insufficient documentation

## 2015-02-12 DIAGNOSIS — Z7982 Long term (current) use of aspirin: Secondary | ICD-10-CM | POA: Insufficient documentation

## 2015-02-12 DIAGNOSIS — M79642 Pain in left hand: Secondary | ICD-10-CM | POA: Insufficient documentation

## 2015-02-12 DIAGNOSIS — M79641 Pain in right hand: Secondary | ICD-10-CM | POA: Insufficient documentation

## 2015-02-12 LAB — COMPLETE METABOLIC PANEL WITH GFR
ALT: 17 U/L (ref 0–53)
AST: 18 U/L (ref 0–37)
Albumin: 3.8 g/dL (ref 3.5–5.2)
Alkaline Phosphatase: 48 U/L (ref 39–117)
BUN: 14 mg/dL (ref 6–23)
CALCIUM: 8.9 mg/dL (ref 8.4–10.5)
CO2: 27 mEq/L (ref 19–32)
Chloride: 102 mEq/L (ref 96–112)
Creat: 0.89 mg/dL (ref 0.50–1.35)
GFR, Est African American: >89 mL/min
GFR, Est Non African American: >89 mL/min
Glucose, Bld: 99 mg/dL (ref 70–99)
Potassium: 3.8 mEq/L (ref 3.5–5.3)
SODIUM: 138 meq/L (ref 135–145)
Total Bilirubin: 0.7 mg/dL (ref 0.2–1.2)
Total Protein: 7.5 g/dL (ref 6.0–8.3)

## 2015-02-12 NOTE — Progress Notes (Signed)
Patient states he thinks he has "trigger" finger His fingers on both hands have a spasm and he Feels like he has not control of it Symptoms started about two months ago

## 2015-02-12 NOTE — Progress Notes (Signed)
MRN: [id number] Name: James Avila  Sex: male Age: 64 y.o. DOB: [birth year]  Allergies: Hydralazine  Chief Complaint  Patient presents with  . Follow-up    HPI: Patient is 64 y.o. male who has to of hypertension, chronic hep C comes today for followup as per patient he also saw a cardiologist and had a negative stress test done, currently following up with infectious disease for hepatitis C treatment, he reported to have bilateral hand pain especially thumb and index finger on and off for few weeks denies any fall or trauma denies any family history of rheumatoid arthritis.  Past Medical History  Diagnosis Date  . Hypertension   . TOBACCO USE 09/07/2007    Qualifier: Diagnosis of  By: Leanne Chang MD, Bruce    . HEPATITIS C 05/10/2007    Qualifier: Diagnosis of  By: Leanne Chang MD, Bruce    . BPH (benign prostatic hyperplasia) 10/10/2013  . Anxiety state, unspecified 10/10/2013    Past Surgical History  Procedure Laterality Date  . Tonsillectomy        Medication List       This list is accurate as of: 02/12/15 12:50 PM.  Always use your most recent med list.               amLODipine 5 MG tablet  Commonly known as:  NORVASC  Take 1 tablet (5 mg total) by mouth daily.     aspirin 81 MG tablet  Take 81 mg by mouth daily.     carvedilol 6.25 MG tablet  Commonly known as:  COREG  TAKE 1 TABLET BY MOUTH TWICE DAILY WITH A MEAL     lisinopril-hydrochlorothiazide 20-25 MG per tablet  Commonly known as:  PRINZIDE,ZESTORETIC  Take 2 tablets by mouth daily.     sertraline 25 MG tablet  Commonly known as:  ZOLOFT  Take 25 mg by mouth daily.        No orders of the defined types were placed in this encounter.    Immunization History  Administered Date(s) Administered  . Hepatitis A, Adult 07/31/2014  . Pneumococcal Polysaccharide-23 07/31/2014    Family History  Problem Relation Age of Onset  . Hypertension Mother   . Hydrocephalus Mother   . Stroke Mother   .  Diabetes Mother   . Cancer Father 62    skin     History  Substance Use Topics  . Smoking status: Current Every Day Smoker -- 0.50 packs/day    Types: Cigarettes  . Smokeless tobacco: Never Used     Comment: Smoking 5 cigs/day  . Alcohol Use: No    Review of Systems   As noted in HPI  Filed Vitals:   02/12/15 1130  BP: 113/73  Pulse: 63  Temp: 98 F (36.7 C)  Resp: 16    Physical Exam  Physical Exam  Constitutional: No distress.  Eyes: EOM are normal. Pupils are equal, round, and reactive to light.  Cardiovascular: Normal rate and regular rhythm.   Pulmonary/Chest: Breath sounds normal. No respiratory distress. He has no wheezes. He has no rales.  Musculoskeletal:  Bilateral hands no erythema or tenderness, good range of motion at the wrist 2+ radial pulse.    CBC    Component Value Date/Time   WBC 5.4 04/30/2014 1500   RBC 4.72 04/30/2014 1500   HGB 14.8 04/30/2014 1500   HCT 42.5 04/30/2014 1500   PLT 191 04/30/2014 1500   MCV 90.0 04/30/2014 1500  LYMPHSABS 2.6 04/30/2014 1500   MONOABS 0.6 04/30/2014 1500   EOSABS 0.3 04/30/2014 1500   BASOSABS 0.1 04/30/2014 1500    CMP     Component Value Date/Time   NA 136 10/08/2014 0912   K 3.9 10/08/2014 0912   CL 98 10/08/2014 0912   CO2 28 10/08/2014 0912   GLUCOSE 98 10/08/2014 0912   BUN 20 10/08/2014 0912   CREATININE 1.01 10/08/2014 0912   CREATININE 0.83 03/10/2014 1545   CALCIUM 9.4 10/08/2014 0912   PROT 7.3 10/08/2014 0912   ALBUMIN 4.0 10/08/2014 0912   AST 52* 10/08/2014 0912   ALT 88* 10/08/2014 0912   ALKPHOS 50 10/08/2014 0912   BILITOT 0.6 10/08/2014 0912   GFRNONAA 79 10/08/2014 0912   GFRNONAA >90 03/10/2014 1545   GFRAA >89 10/08/2014 0912   GFRAA >90 03/10/2014 1545    Lab Results  Component Value Date/Time   CHOL 179 10/08/2014 09:12 AM    No components found for: HGA1C  Lab Results  Component Value Date/Time   AST 52* 10/08/2014 09:12 AM    Assessment and  Plan  Essential hypertension - Plan: blood pressure is well controlled, continue with amlodipine Coreg, lisinopril/hydrochlorthiazide. Repeat blood chemistry COMPLETE METABOLIC PANEL WITH GFR  Pain of hand, unspecified laterality - Plan: ? Of arthritis, I have ordered baseline x-ray DG HANDS 1 VIEW BILAT BALLCATCHERS, Vit D  25 hydroxy (rtn osteoporosis monitoring)   Health Maintenance  -Vaccinations:  Updated with Pneumovax  Return in about 3 months (around 05/15/2015), or if symptoms worsen or fail to improve, for hypertension.   This note has been created with Surveyor, quantity. Any transcriptional errors are unintentional.    Lorayne Marek, MD

## 2015-02-13 ENCOUNTER — Telehealth: Payer: Self-pay

## 2015-02-13 ENCOUNTER — Ambulatory Visit (HOSPITAL_COMMUNITY)
Admission: RE | Admit: 2015-02-13 | Discharge: 2015-02-13 | Disposition: A | Discharge status: Home / Self Care | Payer: Self-pay | Source: Ambulatory Visit | Attending: Internal Medicine | Admitting: Internal Medicine

## 2015-02-13 DIAGNOSIS — M25642 Stiffness of left hand, not elsewhere classified: Secondary | ICD-10-CM | POA: Insufficient documentation

## 2015-02-13 DIAGNOSIS — M79643 Pain in unspecified hand: Secondary | ICD-10-CM

## 2015-02-13 DIAGNOSIS — M25641 Stiffness of right hand, not elsewhere classified: Secondary | ICD-10-CM | POA: Insufficient documentation

## 2015-02-13 LAB — VITAMIN D 25 HYDROXY (VIT D DEFICIENCY, FRACTURES): VIT D 25 HYDROXY: 10 ng/mL — AB (ref 30–100)

## 2015-02-13 MED ORDER — VITAMIN D (ERGOCALCIFEROL) 1.25 MG (50000 UNIT) PO CAPS: 50000.0000 [IU] | ORAL_CAPSULE | ORAL | Status: DC

## 2015-02-13 NOTE — Telephone Encounter (Signed)
-----   Message from Lorayne Marek, MD sent at 02/13/2015  9:24 AM EDT ----- Blood work reviewed, noticed low vitamin D, call patient advise to start ergocalciferol 50,000 units once a week for the duration of  12 weeks.

## 2015-02-13 NOTE — Telephone Encounter (Signed)
Patient is not available Left message on voice mail to return our call [facility]

## 2015-02-16 ENCOUNTER — Telehealth: Payer: Self-pay

## 2015-02-16 NOTE — Telephone Encounter (Signed)
Patient is aware of his lab results 

## 2015-02-17 ENCOUNTER — Telehealth: Payer: Self-pay

## 2015-02-17 NOTE — Telephone Encounter (Signed)
Patient is aware of his x ray result

## 2015-02-17 NOTE — Telephone Encounter (Signed)
-----   Message from Lorayne Marek, MD sent at 02/13/2015  4:44 PM EDT ----- Call and let the patient know that his hand xray is reported to be normal

## 2015-02-23 ENCOUNTER — Other Ambulatory Visit: Payer: Self-pay | Admitting: Internal Medicine

## 2015-06-04 ENCOUNTER — Other Ambulatory Visit: Payer: Self-pay | Admitting: Internal Medicine

## 2015-06-16 ENCOUNTER — Telehealth: Payer: Self-pay | Admitting: General Practice

## 2015-06-16 NOTE — Telephone Encounter (Signed)
Patient presents to clinic to speak with nurse regarding medication review. Asked patient to explain... Patient states he would like to have his medications changed from a 30 day to a 60 day supply. Advised patient that he may need an appointment before these changes can be made. Patient still requesting a call back. Patient states that he can be reached after 12 noon, each day. Please assist.

## 2015-06-18 ENCOUNTER — Telehealth: Payer: Self-pay

## 2015-06-18 NOTE — Telephone Encounter (Signed)
Returned patient phone call Patient not available Left message on voice mail to return our call 

## 2015-08-05 ENCOUNTER — Emergency Department (INDEPENDENT_AMBULATORY_CARE_PROVIDER_SITE_OTHER)
Admission: EM | Admit: 2015-08-05 | Discharge: 2015-08-05 | Disposition: A | Discharge status: Home / Self Care | Payer: Self-pay | Source: Home / Self Care | Attending: Family Medicine | Admitting: Family Medicine

## 2015-08-05 ENCOUNTER — Encounter (HOSPITAL_COMMUNITY): Payer: Self-pay | Admitting: *Deleted

## 2015-08-05 DIAGNOSIS — T148 Other injury of unspecified body region: Secondary | ICD-10-CM

## 2015-08-05 DIAGNOSIS — T148XXA Other injury of unspecified body region, initial encounter: Secondary | ICD-10-CM

## 2015-08-05 DIAGNOSIS — Z23 Encounter for immunization: Secondary | ICD-10-CM

## 2015-08-05 MED ORDER — TETANUS-DIPHTH-ACELL PERTUSSIS 5-2.5-18.5 LF-MCG/0.5 IM SUSP
0.5000 mL | Freq: Once | INTRAMUSCULAR | Status: AC
  Administered 2015-08-05: 0.5 mL via INTRAMUSCULAR

## 2015-08-05 MED ORDER — TETANUS-DIPHTH-ACELL PERTUSSIS 5-2.5-18.5 LF-MCG/0.5 IM SUSP
INTRAMUSCULAR | Status: AC
  Filled 2015-08-05: qty 0.5

## 2015-08-05 NOTE — ED Notes (Signed)
Pt  States  About      1  wek  Ago  He  Got  A  Splinter in the  Top  Of his  l  Foot  He  Has  A  reddned  Tender  Area  To  The  Top   Of   His  r   Foot     Pt  Thins  There  May  Be  A  Splinter  Remaining

## 2015-08-05 NOTE — ED Notes (Signed)
Pt  Requesting  An  Anti  Biotic     -  Dr  Juventino Slovak  Came  Back  And  Spoke  To  Him

## 2015-08-05 NOTE — Discharge Instructions (Signed)
Buy an over the counter triple antibiotic ointment (brand name is Neosporin... Contains neomycin-bacitracin-polymixin B)  Soak the foot with sitz bath and use warm compress  If swelling does not improve over next week return to urgent care to have a provider look at it again and possibly try to drain more fluid.

## 2015-08-05 NOTE — ED Provider Notes (Signed)
CSN: [id number]     Arrival date & time 08/05/15  1305 History   First MD Initiated Contact with Patient 08/05/15 1402     Chief Complaint  Patient presents with  . Abscess   (Consider location/radiation/quality/duration/timing/severity/associated sxs/prior Treatment) HPI Patient is a 64 y.o. male presenting with splinter in right foot. Pt states last week was helping a friend and wood molding fell onto his foot. He thought he had gotten out all of the wood but now has seen a retained splinter. Foot is painful and got progressively more swollen and difficult to walk to the point last night where he decided it was infected and took an antibiotic that had been prescribed to his mother for a UTI. Swelling improved a lot overnight. Pain is "not bad" currently. Denies drainage from the area. He denies fevers, chills, nausea, vomiting. Able to move all toes without issue.  Past Medical History  Diagnosis Date  . Hypertension   . TOBACCO USE 09/07/2007    Qualifier: Diagnosis of  By: Leanne Chang MD, Bruce    . HEPATITIS C 05/10/2007    Qualifier: Diagnosis of  By: Leanne Chang MD, Bruce    . BPH (benign prostatic hyperplasia) 10/10/2013  . Anxiety state, unspecified 10/10/2013   Past Surgical History  Procedure Laterality Date  . Tonsillectomy     Family History  Problem Relation Age of Onset  . Hypertension Mother   . Hydrocephalus Mother   . Stroke Mother   . Diabetes Mother   . Cancer Father 42    skin    Social History  Substance Use Topics  . Smoking status: Current Every Day Smoker -- 0.50 packs/day    Types: Cigarettes  . Smokeless tobacco: Never Used     Comment: Smoking 5 cigs/day  . Alcohol Use: No    Review of Systems Per HPI  Allergies  Hydralazine  Home Medications   Prior to Admission medications   Medication Sig Start Date End Date Taking? Authorizing Provider  amLODipine (NORVASC) 5 MG tablet Take 1 tablet (5 mg total) by mouth daily. 11/04/14   Darlin Coco, MD    aspirin 81 MG tablet Take 81 mg by mouth daily.    Historical Provider, MD  carvedilol (COREG) 6.25 MG tablet TAKE 1 TABLET BY MOUTH TWICE DAILY WITH A MEAL 06/10/15   Lorayne Marek, MD  lisinopril-hydrochlorothiazide (PRINZIDE,ZESTORETIC) 20-25 MG per tablet Take 2 tablets by mouth daily. 10/15/14   Lorayne Marek, MD  lisinopril-hydrochlorothiazide (PRINZIDE,ZESTORETIC) 20-25 MG per tablet TAKE 2 TABLETS BY MOUTH DAILY. 06/10/15   Lorayne Marek, MD  sertraline (ZOLOFT) 25 MG tablet Take 25 mg by mouth daily.    Historical Provider, MD  Vitamin D, Ergocalciferol, (DRISDOL) 50000 UNITS CAPS capsule Take 1 capsule (50,000 Units total) by mouth every 7 (seven) days. 02/13/15   Lorayne Marek, MD   Meds Ordered and Administered this Visit  Medications - No data to display  BP 149/60 mmHg  Pulse 75  Temp(Src) 98 F (36.7 C) (Oral)  Resp 16  SpO2 96% No data found.   Physical Exam  Constitutional: He is oriented to person, place, and time. He appears well-developed and well-nourished.  Musculoskeletal:       Feet:  No drainage. 2+ PT, DP pulses. Able to move all toes without issue.  Neurological: He is alert and oriented to person, place, and time.  Skin: There is erythema.  Nursing note and vitals reviewed.    ED Course  Procedures (including critical care  time)  Labs Review Labs Reviewed - No data to display  Imaging Review No results found.  Procedure note: Splinter removal Location: top of right foot Area prepped with iodine swab. Topical cold spray used for anesthetic and #15 blade used to scrape top of skin where splinter was located. Small amount of pus came out with lateral applied pressure, no further fluctuance noted. Minimal bleeding and hemostatic with bandaid. No complications.   MDM   1. Splinter in skin    Splinter removed per above. Recommended warm baths and compresses to promote any drainage. Topical triple antibiotic ointment. Return to urgent care if not  improving over next week.     Leone Brand, MD 08/05/15 [phone]

## 2015-08-14 ENCOUNTER — Ambulatory Visit: Payer: Self-pay | Attending: Family Medicine

## 2015-11-23 IMAGING — US US ABDOMEN COMPLETE W/ ELASTOGRAPHY
1 series · 13 of 13 positions shown · non-contrast
Comparison: None.

CORRELATIVE IMAGING:
None.

CLINICAL DATA: Hepatitis-C.

EXAM:
ULTRASOUND HEPATIC ELASTOGRAPHY
TECHNIQUE: Ultrasound elastography evaluation of the liver was performed. A
region of interest was placed in the right lobe of the liver.
Following application of a compressive sonographic pulse, shear
waves were detected in the adjacent hepatic tissue and the shear
wave velocity was calculated. Multiple assessments were performed at
the selected site. Median shear wave velocity is correlated to a
Metavir fibrosis score.

[Series 1: us abdomen complete w/ elastography · 0.18mm/px · 13 of 13 slices shown]
[im 1/13]
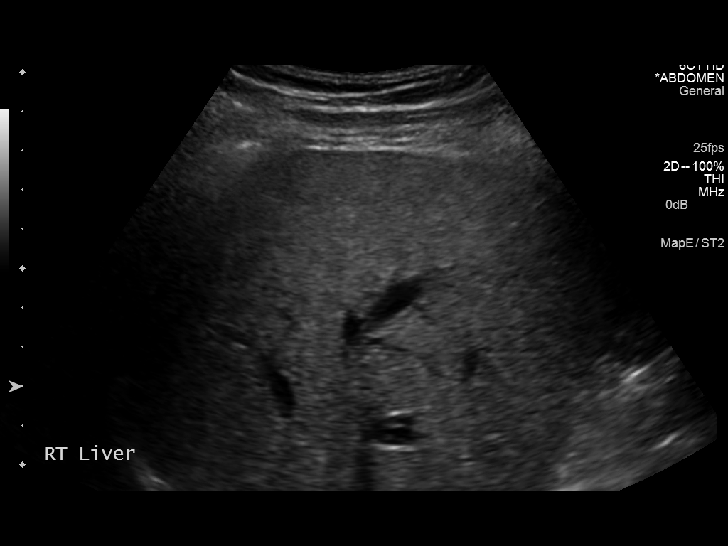
[im 2/13]
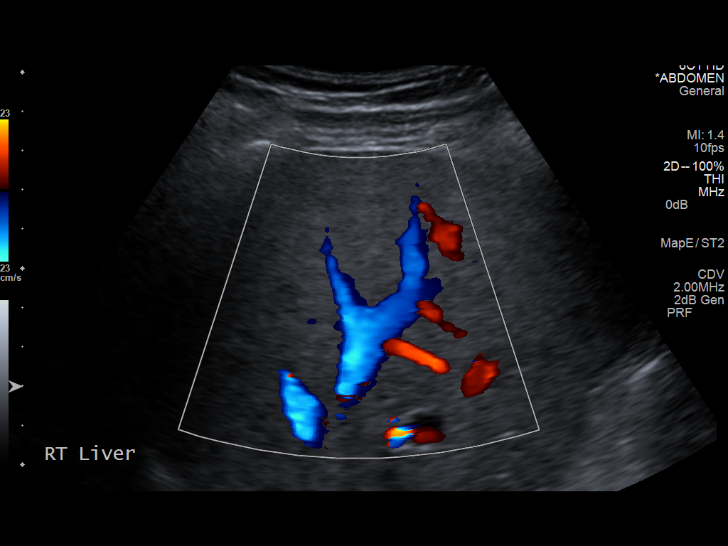
[im 3/13]
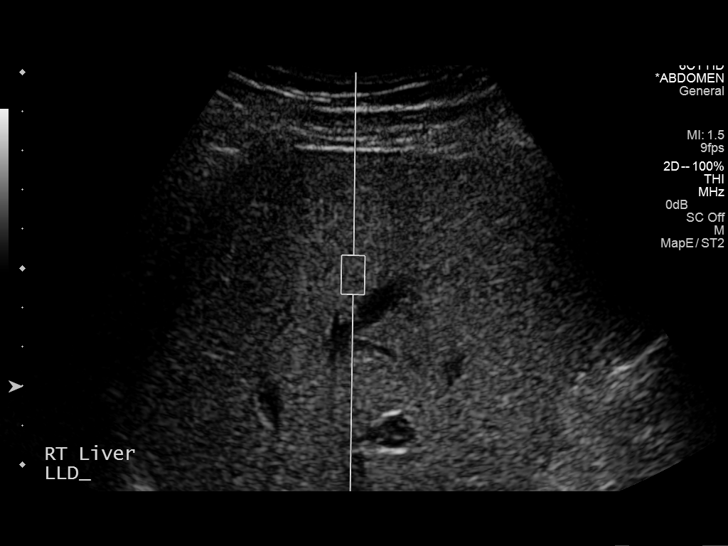
[im 4/13]
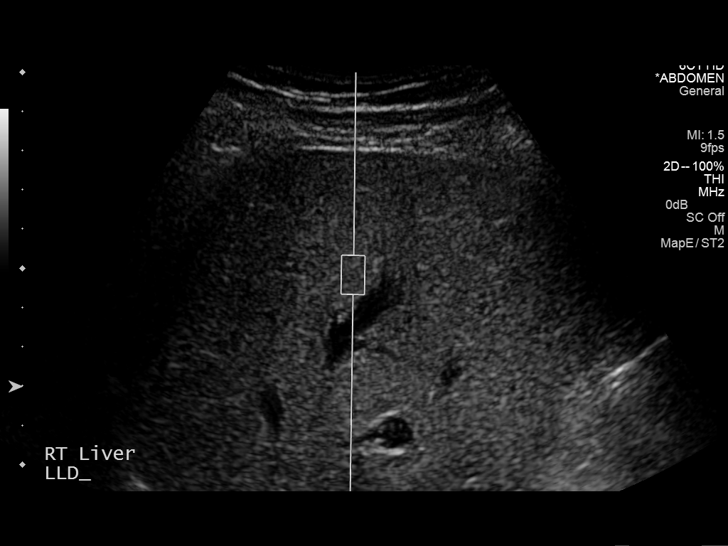
[im 5/13]
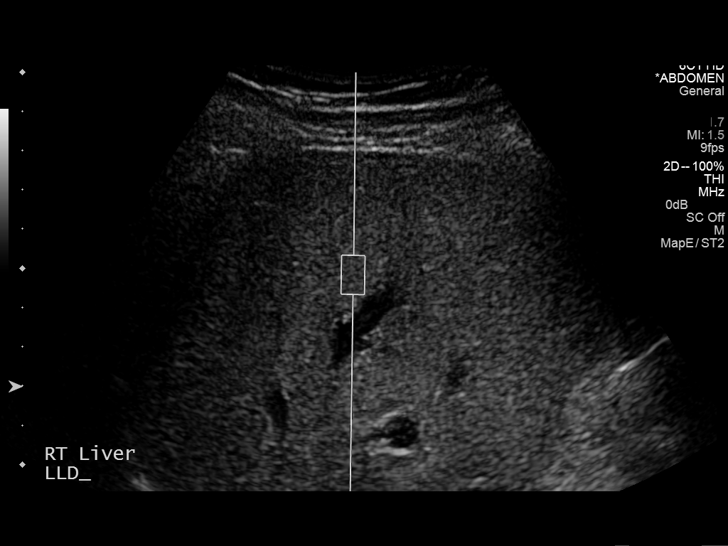
[im 6/13]
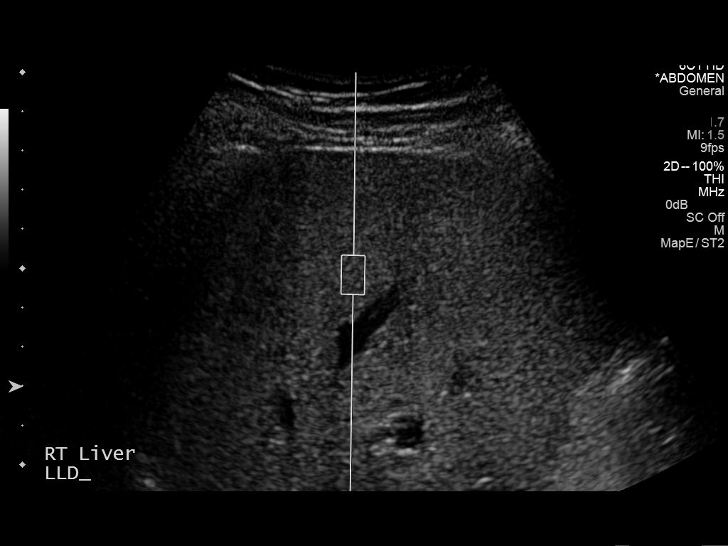
[im 7/13]
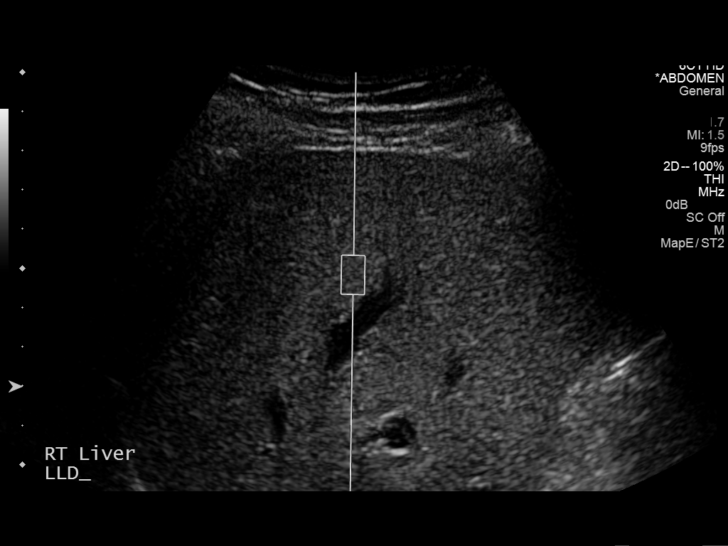
[im 8/13]
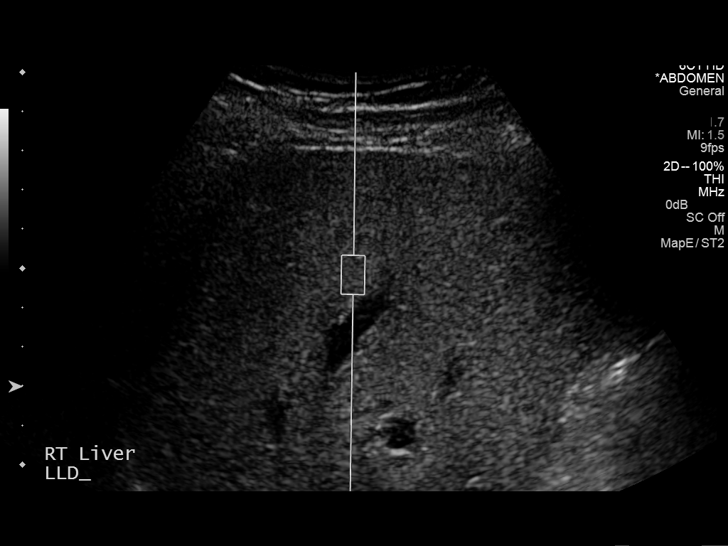
[im 9/13]
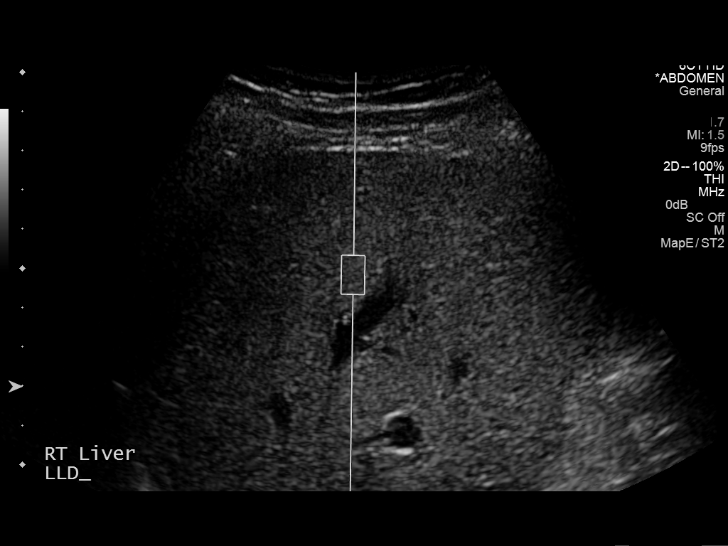
[im 10/13]
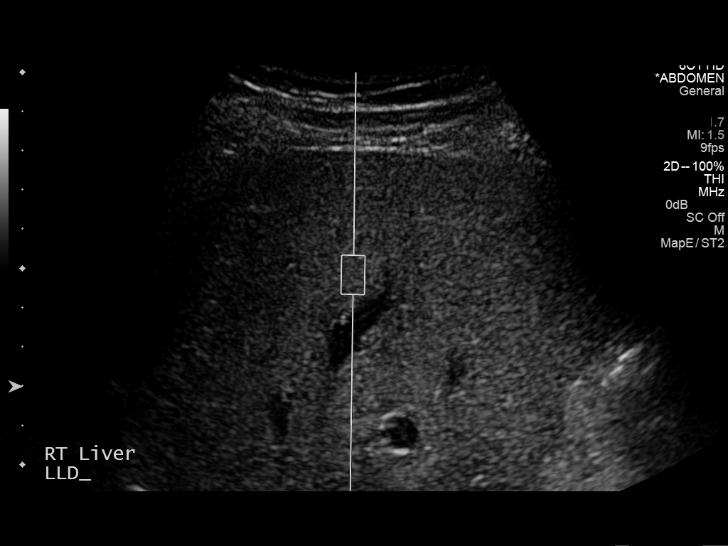
[im 11/13]
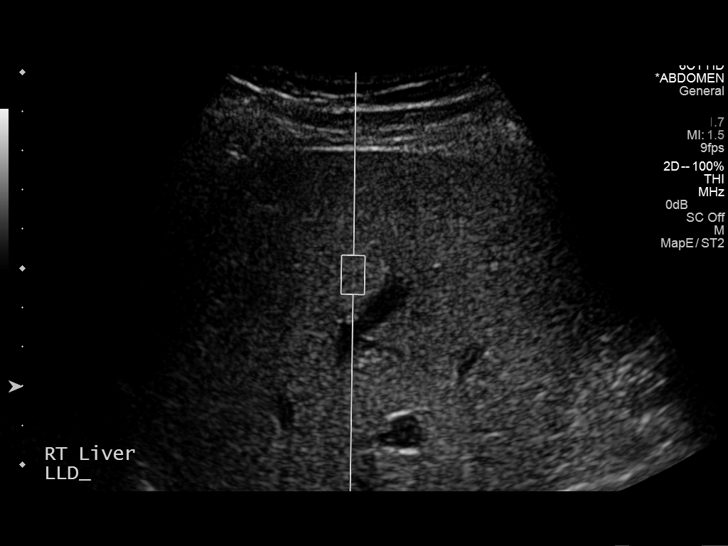
[im 12/13]
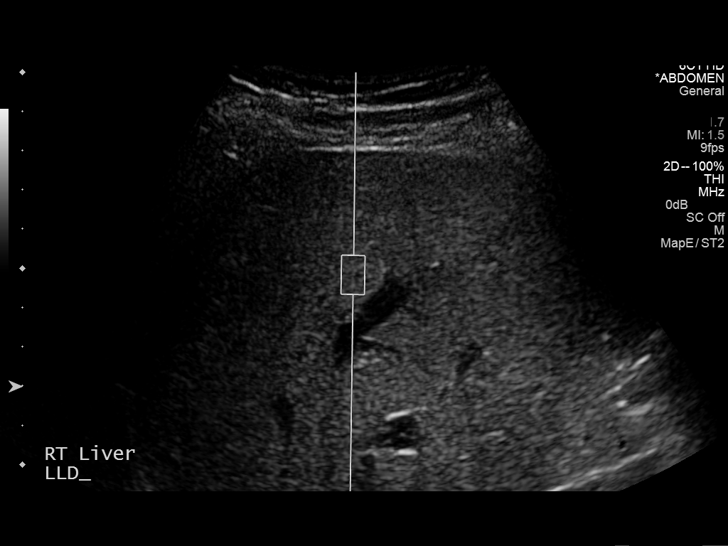
[im 13/13]
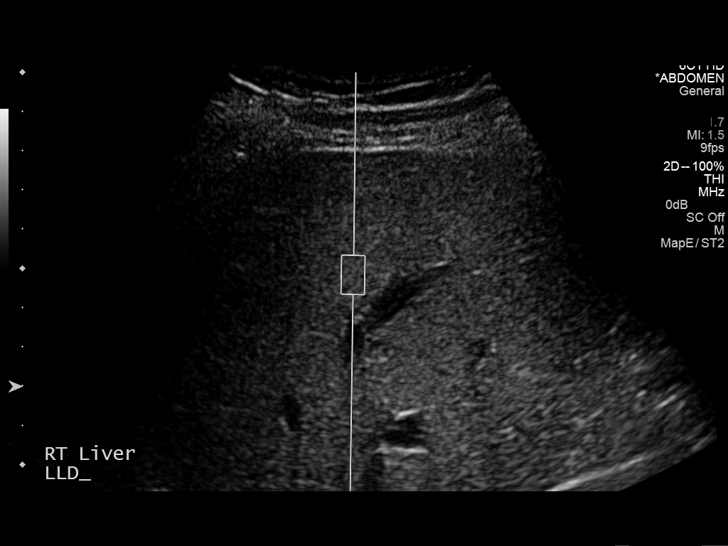

[13 of 13 positions shown; findings below may reference images not displayed]

FINDINGS: Hepatic Segment: 8

Number of measurements: 11

Median velocity:  2.36 m/sec

Corresponding Metavir fibrosis score: F4

Pertinent liver disease findings noted on other imaging exams: None
noted or none available

Please note that abnormal shear wave velocities may also be
identified in clinical settings other than with hepatic fibrosis,
such as: acute hepatitis, elevated right heart and central venous
pressures, Joe disease (Lian), infiltrative
processes such as mastocytosis/amyloidosis/infiltrative tumor,
extrahepatic cholestasis, in the post-prandial state, and liver
transplantation. Correlation with patient history, laboratory data,
and clinical condition recommended.
IMPRESSION: Median hepatic shear wave velocity is calculated at 2.36 m/sec,
which corresponds to a Metavir fibrosis score of F4.

## 2015-12-02 ENCOUNTER — Other Ambulatory Visit: Payer: Self-pay | Admitting: Internal Medicine

## 2015-12-02 MED FILL — CARVEDILOL 6.25 MG TABLET: 6.25 | 30 days supply | Qty: 60 | Fill #0

## 2015-12-02 MED FILL — LISINOPRIL-HCTZ 20-25 MG TA: 20-25 | 30 days supply | Qty: 60 | Fill #3

## 2015-12-14 ENCOUNTER — Other Ambulatory Visit: Payer: Self-pay | Admitting: Cardiology

## 2015-12-15 MED FILL — AMLODIPINE BESYLATE 5 MG TA: 5 | 30 days supply | Qty: 30 | Fill #0

## 2016-01-28 ENCOUNTER — Other Ambulatory Visit: Payer: Self-pay | Admitting: Internal Medicine

## 2016-01-28 ENCOUNTER — Other Ambulatory Visit: Payer: Self-pay | Admitting: Cardiology

## 2016-02-11 ENCOUNTER — Other Ambulatory Visit: Payer: Self-pay | Admitting: Internal Medicine

## 2016-02-11 MED FILL — ?AMLODIPINE BESYLATE 5 MG T: 5 | 30 days supply | Qty: 30 | Fill #0

## 2016-02-22 ENCOUNTER — Other Ambulatory Visit: Payer: Self-pay | Admitting: Internal Medicine

## 2016-02-22 NOTE — Telephone Encounter (Signed)
Pt. Came in today requesting a med refill on the following medications:  carvedilol (COREG) 6.25 MG tablet  lisinopril-hydrochlorothiazide (PRINZIDE,ZESTORETIC) 20-25 MG per tablet   Please f/u with pt.

## 2016-02-29 ENCOUNTER — Ambulatory Visit: Payer: Self-pay

## 2016-03-07 ENCOUNTER — Ambulatory Visit: Payer: Self-pay | Admitting: Family Medicine

## 2016-03-15 MED FILL — LISINOPRIL-HCTZ 20-25 MG TA: 20-25 | 30 days supply | Qty: 60 | Fill #0

## 2016-03-15 MED FILL — ?AMLODIPINE BESYLATE 5 MG T: 5 | 30 days supply | Qty: 30 | Fill #1

## 2016-03-15 MED FILL — CARVEDILOL 6.25 MG TABLET: 6.25 | 30 days supply | Qty: 60 | Fill #0

## 2016-03-25 ENCOUNTER — Ambulatory Visit: Payer: Self-pay | Attending: Family Medicine

## 2016-04-08 ENCOUNTER — Ambulatory Visit: Payer: Self-pay | Attending: Family Medicine | Admitting: Family Medicine

## 2016-04-08 ENCOUNTER — Encounter: Payer: Self-pay | Admitting: Family Medicine

## 2016-04-08 VITALS — BP 128/79 | HR 72 | Temp 98.1°F | Resp 16 | Ht 71.0 in | Wt 186.0 lb

## 2016-04-08 DIAGNOSIS — N529 Male erectile dysfunction, unspecified: Secondary | ICD-10-CM | POA: Insufficient documentation

## 2016-04-08 DIAGNOSIS — B070 Plantar wart: Secondary | ICD-10-CM

## 2016-04-08 DIAGNOSIS — Z7982 Long term (current) use of aspirin: Secondary | ICD-10-CM | POA: Insufficient documentation

## 2016-04-08 DIAGNOSIS — E559 Vitamin D deficiency, unspecified: Secondary | ICD-10-CM | POA: Insufficient documentation

## 2016-04-08 DIAGNOSIS — Z79899 Other long term (current) drug therapy: Secondary | ICD-10-CM | POA: Insufficient documentation

## 2016-04-08 DIAGNOSIS — F17210 Nicotine dependence, cigarettes, uncomplicated: Secondary | ICD-10-CM | POA: Insufficient documentation

## 2016-04-08 DIAGNOSIS — L918 Other hypertrophic disorders of the skin: Secondary | ICD-10-CM

## 2016-04-08 DIAGNOSIS — I10 Essential (primary) hypertension: Secondary | ICD-10-CM | POA: Insufficient documentation

## 2016-04-08 DIAGNOSIS — K738 Other chronic hepatitis, not elsewhere classified: Secondary | ICD-10-CM | POA: Insufficient documentation

## 2016-04-08 DIAGNOSIS — B182 Chronic viral hepatitis C: Secondary | ICD-10-CM

## 2016-04-08 MED ORDER — CARVEDILOL 6.25 MG PO TABS: 6.2500 mg | ORAL_TABLET | Freq: Two times a day (BID) | ORAL | Status: DC

## 2016-04-08 MED ORDER — ASPIRIN 81 MG PO TABS: 81.0000 mg | ORAL_TABLET | Freq: Every day | ORAL | Status: DC

## 2016-04-08 MED ORDER — AMLODIPINE BESYLATE 5 MG PO TABS: 5.0000 mg | ORAL_TABLET | Freq: Every day | ORAL | Status: DC

## 2016-04-08 MED ORDER — LISINOPRIL-HYDROCHLOROTHIAZIDE 20-12.5 MG PO TABS: 2.0000 | ORAL_TABLET | Freq: Every day | ORAL | Status: DC

## 2016-04-08 MED ORDER — LISINOPRIL-HYDROCHLOROTHIAZIDE 20-12.5 MG PO TABS
2.0000 | ORAL_TABLET | Freq: Every day | ORAL | Status: DC
Start: 2016-04-08 — End: 2017-01-20

## 2016-04-08 NOTE — Patient Instructions (Addendum)
James Avila was seen today for hypertension.  Diagnoses and all orders for this visit:  Vitamin D deficiency -     Cancel: Vitamin D, 25-hydroxy -     Vitamin D, 25-hydroxy; Future  Essential hypertension -     Cancel: COMPLETE METABOLIC PANEL WITH GFR -     Cancel: Lipid Panel -     Discontinue: carvedilol (COREG) 6.25 MG tablet; Take 1 tablet (6.25 mg total) by mouth 2 (two) times daily with a meal. -     Discontinue: amLODipine (NORVASC) 5 MG tablet; Take 1 tablet (5 mg total) by mouth daily. -     Discontinue: aspirin 81 MG tablet; Take 1 tablet (81 mg total) by mouth daily. -     Discontinue: lisinopril-hydrochlorothiazide (ZESTORETIC) 20-12.5 MG tablet; Take 2 tablets by mouth daily. -     COMPLETE METABOLIC PANEL WITH GFR; Future -     Lipid Panel; Future -     aspirin 81 MG tablet; Take 1 tablet (81 mg total) by mouth daily. -     carvedilol (COREG) 6.25 MG tablet; Take 1 tablet (6.25 mg total) by mouth 2 (two) times daily with a meal. -     amLODipine (NORVASC) 5 MG tablet; Take 1 tablet (5 mg total) by mouth daily. -     lisinopril-hydrochlorothiazide (ZESTORETIC) 20-12.5 MG tablet; Take 2 tablets by mouth daily.  Impotence -     Testosterone; Future -     FSH/LH; Future   Come fasting for labs between 8: 30 -10:00 AM at your earliest convenience   F/u in 2-4 weeks for skin tag removal, 30 minute procedure visit   Dr. Adrian Blackwater

## 2016-04-08 NOTE — Progress Notes (Signed)
F/U HTN  Complaining of irritation on lt heal x 1 year ago  Taking medication daily  Tobacco user 2 cigarette per day  No pain today  No suicidal thoughts in the past two weeks

## 2016-04-08 NOTE — Progress Notes (Signed)
Subjective:  Patient ID: James Avila, male    DOB: 17-Aug-1951  Age: 65 y.o. MRN: [id number]  CC: Hypertension   HPI NOURI BRUSKY presents for   1. CHRONIC HYPERTENSION  Disease Monitoring  Blood pressure range: not checking   Chest pain: no   Dyspnea: no   Claudication: no   Medication compliance: yes  Medication Side Effects  Lightheadedness: no   Urinary frequency: no   Edema: no   Impotence: yes   2. Left heel pain:  With wart x one year. Request treatment.   3. Erectile dysfunction: has low T. Would like to have it treated. Having trouble getting and maintaining erection.   4. Skin tag: on bottom. Irritated. He would like it removed.   Social History  Substance Use Topics  . Smoking status: Current Every Day Smoker -- 0.50 packs/day    Types: Cigarettes  . Smokeless tobacco: Never Used     Comment: Smoking 5 cigs/day  . Alcohol Use: No    Outpatient Prescriptions Prior to Visit  Medication Sig Dispense Refill  . amLODipine (NORVASC) 5 MG tablet TAKE 1 TABLET BY MOUTH DAILY. 30 tablet 9  . aspirin 81 MG tablet Take 81 mg by mouth daily.    . carvedilol (COREG) 6.25 MG tablet TAKE 1 TABLET BY MOUTH 2 TIMES DAILY WITH A MEAL. NEEDS OFFICE VISIT FOR REFILLS 60 tablet 0  . lisinopril-hydrochlorothiazide (PRINZIDE,ZESTORETIC) 20-25 MG tablet TAKE 2 TABLETS BY MOUTH DAILY. 60 tablet 0  . sertraline (ZOLOFT) 25 MG tablet Take 25 mg by mouth daily. Reported on 04/08/2016    . Vitamin D, Ergocalciferol, (DRISDOL) 50000 UNITS CAPS capsule Take 1 capsule (50,000 Units total) by mouth every 7 (seven) days. (Patient not taking: Reported on 04/08/2016) 12 capsule 0   No facility-administered medications prior to visit.    ROS Review of Systems  Constitutional: Negative for fever, chills, fatigue and unexpected weight change.  Eyes: Negative for visual disturbance.  Respiratory: Negative for cough and shortness of breath.   Cardiovascular: Negative for chest pain,  palpitations and leg swelling.  Gastrointestinal: Negative for nausea, vomiting, abdominal pain, diarrhea, constipation and blood in stool.  Endocrine: Negative for polydipsia, polyphagia and polyuria.  Musculoskeletal: Positive for arthralgias (L heel pain ). Negative for myalgias, back pain, gait problem and neck pain.  Skin: Negative for rash.  Allergic/Immunologic: Negative for immunocompromised state.  Hematological: Negative for adenopathy. Does not bruise/bleed easily.  Psychiatric/Behavioral: Negative for suicidal ideas, sleep disturbance and dysphoric mood. The patient is not nervous/anxious.     Objective:  BP 128/79 mmHg  Pulse 72  Temp(Src) 98.1 F (36.7 C) (Oral)  Resp 16  Ht 5\' 11"  (1.803 m)  Wt 186 lb (84.369 kg)  BMI 25.95 kg/m2  SpO2 96%  BP/Weight 04/08/2016 08/05/2015 123XX123  Systolic BP 0000000 123456 123456  Diastolic BP 79 60 73  Wt. (Lbs) 186 - 187  BMI 25.95 - 26.09   Physical Exam  Constitutional: He appears well-developed and well-nourished. No distress.  HENT:  Head: Normocephalic and atraumatic.  Neck: Normal range of motion. Neck supple.  Cardiovascular: Normal rate, regular rhythm, normal heart sounds and intact distal pulses.   Pulmonary/Chest: Effort normal and breath sounds normal.  Musculoskeletal: He exhibits no edema.       Feet:  Neurological: He is alert.  Skin: Skin is warm and dry. No rash noted. No erythema.     Psychiatric: He has a normal mood and affect.  Treated L heel with cryotherapy x 3 freeze-thaw cycles  Assessment & Plan:   There are no diagnoses linked to this encounter. [place] was seen today for hypertension.  Diagnoses and all orders for this visit:  Vitamin D deficiency -     Cancel: Vitamin D, 25-hydroxy -     Vitamin D, 25-hydroxy; Future  Essential hypertension -     Cancel: COMPLETE METABOLIC PANEL WITH GFR -     Cancel: Lipid Panel -     Discontinue: carvedilol (COREG) 6.25 MG tablet; Take 1 tablet (6.25 mg  total) by mouth 2 (two) times daily with a meal. -     Discontinue: amLODipine (NORVASC) 5 MG tablet; Take 1 tablet (5 mg total) by mouth daily. -     Discontinue: aspirin 81 MG tablet; Take 1 tablet (81 mg total) by mouth daily. -     Discontinue: lisinopril-hydrochlorothiazide (ZESTORETIC) 20-12.5 MG tablet; Take 2 tablets by mouth daily. -     COMPLETE METABOLIC PANEL WITH GFR; Future -     Lipid Panel; Future -     aspirin 81 MG tablet; Take 1 tablet (81 mg total) by mouth daily. -     carvedilol (COREG) 6.25 MG tablet; Take 1 tablet (6.25 mg total) by mouth 2 (two) times daily with a meal. -     amLODipine (NORVASC) 5 MG tablet; Take 1 tablet (5 mg total) by mouth daily. -     lisinopril-hydrochlorothiazide (ZESTORETIC) 20-12.5 MG tablet; Take 2 tablets by mouth daily.  Impotence -     Testosterone; Future -     FSH/LH; Future  Chronic hepatitis C without hepatic coma (HCC) -     Cancel: Hepatitis C RNA quantitative -     Hepatitis C RNA quantitative; Future  Plantar wart of left foot  Skin tag   Meds ordered this encounter  Medications  . DISCONTD: carvedilol (COREG) 6.25 MG tablet    Sig: Take 1 tablet (6.25 mg total) by mouth 2 (two) times daily with a meal.    Dispense:  60 tablet    Refill:  12  . DISCONTD: amLODipine (NORVASC) 5 MG tablet    Sig: Take 1 tablet (5 mg total) by mouth daily.    Dispense:  30 tablet    Refill:  12  . DISCONTD: aspirin 81 MG tablet    Sig: Take 1 tablet (81 mg total) by mouth daily.    Dispense:  30 tablet    Refill:  12  . DISCONTD: lisinopril-hydrochlorothiazide (ZESTORETIC) 20-12.5 MG tablet    Sig: Take 2 tablets by mouth daily.    Dispense:  60 tablet    Refill:  11  . aspirin 81 MG tablet    Sig: Take 1 tablet (81 mg total) by mouth daily.    Dispense:  90 tablet    Refill:  3  . carvedilol (COREG) 6.25 MG tablet    Sig: Take 1 tablet (6.25 mg total) by mouth 2 (two) times daily with a meal.    Dispense:  180 tablet     Refill:  3  . amLODipine (NORVASC) 5 MG tablet    Sig: Take 1 tablet (5 mg total) by mouth daily.    Dispense:  90 tablet    Refill:  3    90 day supply on all chronic med please  . lisinopril-hydrochlorothiazide (ZESTORETIC) 20-12.5 MG tablet    Sig: Take 2 tablets by mouth daily.    Dispense:  180 tablet  Refill:  3    Follow-up: No Follow-up on file.   Boykin Nearing MD

## 2016-04-11 DIAGNOSIS — L918 Other hypertrophic disorders of the skin: Secondary | ICD-10-CM | POA: Insufficient documentation

## 2016-04-11 DIAGNOSIS — B070 Plantar wart: Secondary | ICD-10-CM | POA: Insufficient documentation

## 2016-04-11 DIAGNOSIS — N529 Male erectile dysfunction, unspecified: Secondary | ICD-10-CM | POA: Insufficient documentation

## 2016-04-11 NOTE — Assessment & Plan Note (Signed)
Gluteal skin tag Plan to f/u for removal

## 2016-04-11 NOTE — Assessment & Plan Note (Signed)
Treated with liquid nitrogen x 3 freeeze-thaw cycles F/u for additional treatments

## 2016-04-22 MED FILL — ?AMLODIPINE BESYLATE 5 MG T: 5 | 30 days supply | Qty: 30 | Fill #2

## 2016-04-22 MED FILL — CARVEDILOL 6.25 MG TABLET: 6.25 | 90 days supply | Qty: 180 | Fill #0

## 2016-05-04 MED FILL — LISINOPRIL-HCTZ 20-12.5 MG: 20-12.5 | 90 days supply | Qty: 180 | Fill #0

## 2016-06-02 MED FILL — ?AMLODIPINE BESYLATE 5 MG T: 5 | 30 days supply | Qty: 30 | Fill #3

## 2016-07-04 MED FILL — ?AMLODIPINE BESYLATE 5 MG T: 5 | 30 days supply | Qty: 30 | Fill #4

## 2016-08-02 MED FILL — LISINOPRIL-HCTZ 20-12.5 MG: 20-12.5 | 30 days supply | Qty: 60 | Fill #1

## 2016-08-02 MED FILL — ?AMLODIPINE BESYLATE 5 MG T: 5 | 30 days supply | Qty: 30 | Fill #5

## 2016-08-02 MED FILL — CARVEDILOL 6.25 MG TABLET: 6.25 | 90 days supply | Qty: 180 | Fill #1

## 2016-08-23 IMAGING — CR DG HAND LIMITED 1V
1 series · 1 of 1 positions shown · non-contrast
Comparison: None.

CLINICAL DATA: Limitation of motion of first and second digits;
pain

EXAM:
AP VIEW EACH HAND

[hand ballcatchers]
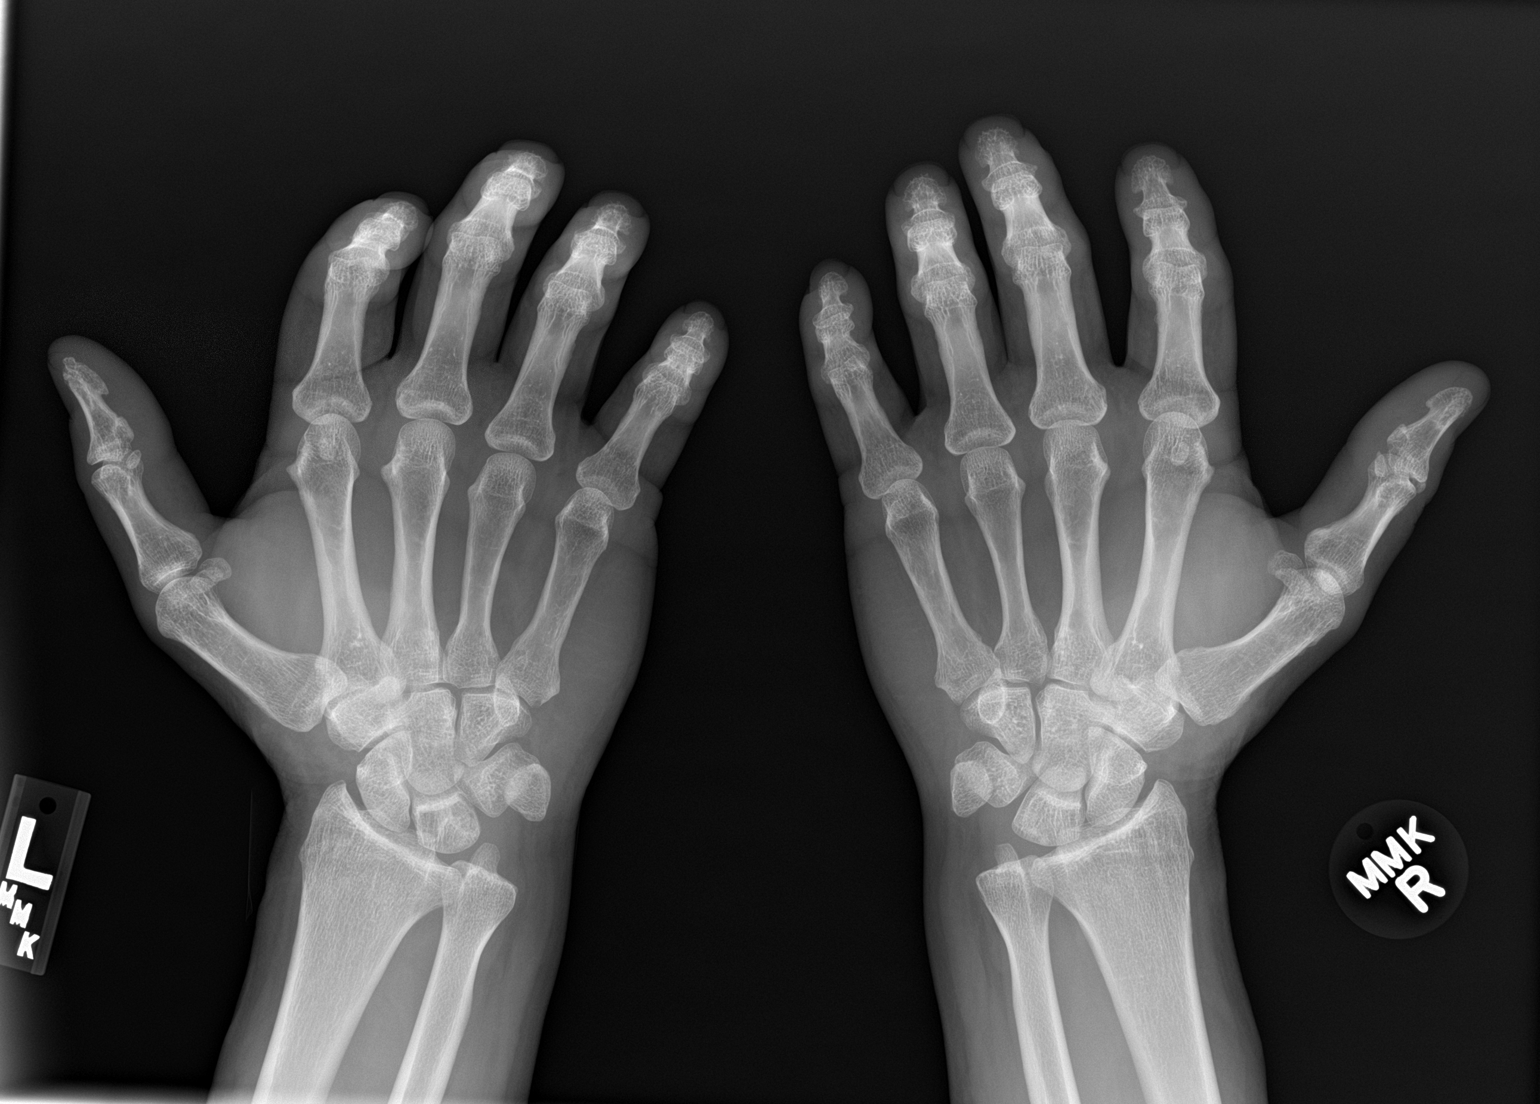

[1 of 1 positions shown; findings below may reference images not displayed]

FINDINGS: AP left hand: No fracture or dislocation. Joint spaces appear
grossly intact. No erosive change or periostitis. Bony
mineralization normal.

AP right hand: No fracture or dislocation. Joint spaces appear
grossly intact. No erosive change or periostitis. Bony
mineralization normal.
IMPRESSION: No abnormality appreciable on single view of each hand.

## 2016-08-30 MED FILL — AMLODIPINE BESYLATE 5 MG TA: 5 | 30 days supply | Qty: 30 | Fill #6

## 2016-09-13 ENCOUNTER — Ambulatory Visit: Payer: Self-pay

## 2016-09-29 ENCOUNTER — Ambulatory Visit: Payer: Self-pay | Attending: Family Medicine

## 2016-10-05 ENCOUNTER — Encounter: Payer: Self-pay | Admitting: Family Medicine

## 2016-10-05 ENCOUNTER — Ambulatory Visit: Payer: Self-pay | Attending: Family Medicine | Admitting: Family Medicine

## 2016-10-05 VITALS — BP 135/75 | HR 71 | Temp 98.4°F | Ht 71.0 in | Wt 189.0 lb

## 2016-10-05 DIAGNOSIS — B1920 Unspecified viral hepatitis C without hepatic coma: Secondary | ICD-10-CM | POA: Insufficient documentation

## 2016-10-05 DIAGNOSIS — Z79899 Other long term (current) drug therapy: Secondary | ICD-10-CM | POA: Insufficient documentation

## 2016-10-05 DIAGNOSIS — Z7982 Long term (current) use of aspirin: Secondary | ICD-10-CM | POA: Insufficient documentation

## 2016-10-05 DIAGNOSIS — D1809 Hemangioma of other sites: Secondary | ICD-10-CM | POA: Insufficient documentation

## 2016-10-05 DIAGNOSIS — D1800 Hemangioma unspecified site: Secondary | ICD-10-CM

## 2016-10-05 DIAGNOSIS — N400 Enlarged prostate without lower urinary tract symptoms: Secondary | ICD-10-CM | POA: Insufficient documentation

## 2016-10-05 DIAGNOSIS — I10 Essential (primary) hypertension: Secondary | ICD-10-CM | POA: Insufficient documentation

## 2016-10-05 MED ORDER — CEPHALEXIN 500 MG PO CAPS: 500.0000 mg | ORAL_CAPSULE | Freq: Two times a day (BID) | ORAL | 0 refills | Status: DC

## 2016-10-05 MED FILL — CEPHALEXIN 500 MG CAPSULE: 500 | 10 days supply | Qty: 20 | Fill #0

## 2016-10-05 NOTE — Progress Notes (Signed)
Subjective:  Patient ID: BRIC RARDIN, male    DOB: 16-Nov-1951  Age: 65 y.o. MRN: [id number]  CC: chin (lesion that started as a "pimple" on friday night) and Hypertension   HPI James Avila is a 65 year old male with a history of hypertension, hepatitis C who presents today with a wound on his chin. 5 days ago he noticed a swelling on his chin which grade, painful, throbbing and progressively increased in size; this subsequently popped with resulting bleeding leaving a wound.  He had similar symptoms about 5 years ago and was seen by a dermatologist when he was prescribed bacitracin ointment for it. He denies presence of similar lesions in other body parts. Lesion is currently not actively bleeding. Denies fever but noticed that he did have some myalgia  Past Medical History:  Diagnosis Date  . Anxiety state, unspecified 10/10/2013  . BPH (benign prostatic hyperplasia) 10/10/2013  . HEPATITIS C 05/10/2007   Qualifier: Diagnosis of  By: Leanne Chang MD, Bruce    . Hypertension   . TOBACCO USE 09/07/2007   Qualifier: Diagnosis of  By: Leanne Chang MD, Bruce     Past Surgical History:  Procedure Laterality Date  . TONSILLECTOMY       Outpatient Medications Prior to Visit  Medication Sig Dispense Refill  . amLODipine (NORVASC) 5 MG tablet Take 1 tablet (5 mg total) by mouth daily. 90 tablet 3  . aspirin 81 MG tablet Take 1 tablet (81 mg total) by mouth daily. 90 tablet 3  . carvedilol (COREG) 6.25 MG tablet Take 1 tablet (6.25 mg total) by mouth 2 (two) times daily with a meal. 180 tablet 3  . lisinopril-hydrochlorothiazide (ZESTORETIC) 20-12.5 MG tablet Take 2 tablets by mouth daily. 180 tablet 3   No facility-administered medications prior to visit.     ROS Review of Systems  Constitutional: Negative for activity change and appetite change.  HENT: Negative for sinus pressure and sore throat.   Respiratory: Negative for chest tightness, shortness of breath and wheezing.     Cardiovascular: Negative for chest pain and palpitations.  Gastrointestinal: Negative for abdominal distention, abdominal pain and constipation.  Genitourinary: Negative.   Musculoskeletal: Negative.   Skin: Positive for wound.  Psychiatric/Behavioral: Negative for behavioral problems and dysphoric mood.    Objective:  BP 135/75 (BP Location: Right Arm, Patient Position: Sitting, Cuff Size: Large)   Pulse 71   Temp 98.4 F (36.9 C) (Oral)   Ht 5\' 11"  (1.803 m)   Wt 189 lb (85.7 kg)   SpO2 96%   BMI 26.36 kg/m   BP/Weight 10/05/2016 [id number]  Systolic BP [id number] 0000000 123456  Diastolic BP 75 79 60  Wt. (Lbs) 189 186 -  BMI 26.36 25.95 -      Physical Exam  Constitutional: He is oriented to person, place, and time. He appears well-developed and well-nourished.  Cardiovascular: Normal rate, normal heart sounds and intact distal pulses.   No murmur heard. Pulmonary/Chest: Effort normal and breath sounds normal. He has no wheezes. He has no rales. He exhibits no tenderness.  Abdominal: Soft. Bowel sounds are normal. He exhibits no distension and no mass. There is no tenderness.  Musculoskeletal: Normal range of motion.  Neurological: He is alert and oriented to person, place, and time.  Skin:  Horizontal laceration with surrounding dried blood. No purulent discharge, no surrounding erythema     Assessment & Plan:   1. Hemangioma He has no other lesions at this time In the  event of recurrence he has been advised to return to the clinic Exercise caution while eating Dressing applied in clinic. Cover with Keflex presumptively.  Meds ordered this encounter  Medications  . cephALEXin (KEFLEX) 500 MG capsule    Sig: Take 1 capsule (500 mg total) by mouth 2 (two) times daily.    Dispense:  20 capsule    Refill:  0    Follow-up: Return in about 2 weeks (around 10/19/2016) for Follow-up of hypertension with Dr. Adrian Blackwater.   Arnoldo Morale MD

## 2016-11-10 MED FILL — LISINOPRIL-HCTZ 20-12.5 MG: 20-12.5 | 30 days supply | Qty: 60 | Fill #2

## 2016-11-10 MED FILL — CARVEDILOL 6.25 MG TABLET: 6.25 | 90 days supply | Qty: 180 | Fill #2

## 2016-12-06 MED FILL — LISINOPRIL-HCTZ 20-12.5 MG: 20-12.5 | 30 days supply | Qty: 60 | Fill #3

## 2017-01-02 MED FILL — LISINOPRIL-HCTZ 20-12.5 MG: 20-12.5 | 90 days supply | Qty: 180 | Fill #4

## 2017-01-04 ENCOUNTER — Ambulatory Visit: Payer: Self-pay | Attending: Family Medicine

## 2017-01-09 ENCOUNTER — Encounter: Payer: Self-pay | Admitting: Physician Assistant

## 2017-01-09 ENCOUNTER — Ambulatory Visit: Payer: Self-pay | Attending: Physician Assistant | Admitting: Physician Assistant

## 2017-01-09 VITALS — BP 151/89 | HR 81 | Temp 98.3°F | Resp 20 | Wt 178.0 lb

## 2017-01-09 DIAGNOSIS — Z7982 Long term (current) use of aspirin: Secondary | ICD-10-CM | POA: Insufficient documentation

## 2017-01-09 DIAGNOSIS — H5711 Ocular pain, right eye: Secondary | ICD-10-CM | POA: Insufficient documentation

## 2017-01-09 DIAGNOSIS — R05 Cough: Secondary | ICD-10-CM | POA: Insufficient documentation

## 2017-01-09 DIAGNOSIS — J019 Acute sinusitis, unspecified: Secondary | ICD-10-CM | POA: Insufficient documentation

## 2017-01-09 DIAGNOSIS — Z79899 Other long term (current) drug therapy: Secondary | ICD-10-CM | POA: Insufficient documentation

## 2017-01-09 DIAGNOSIS — I10 Essential (primary) hypertension: Secondary | ICD-10-CM | POA: Insufficient documentation

## 2017-01-09 MED ORDER — SALINE SPRAY 0.65 % NA SOLN: 1.0000 | NASAL | 0 refills | Status: DC | PRN

## 2017-01-09 MED ORDER — AMOXICILLIN-POT CLAVULANATE 875-125 MG PO TABS: 1.0000 | ORAL_TABLET | Freq: Two times a day (BID) | ORAL | 0 refills | Status: AC

## 2017-01-09 MED ORDER — DM-APAP-CPM 15-500-2 MG PO TABS
2.0000 | ORAL_TABLET | Freq: Four times a day (QID) | ORAL | 0 refills | Status: AC
Start: 2017-01-09 — End: 2017-01-14

## 2017-01-09 MED FILL — AMOX-CLAV 875-125 MG TABLET: 875-125 | 10 days supply | Qty: 20 | Fill #0

## 2017-01-09 NOTE — Patient Instructions (Addendum)
Please return next Friday for follow up of your hypertension. Unfortunately you have missed a couple of follow ups and I want to make sure we can reduce your stroke and heart attack risk.   Sinusitis, Adult Sinusitis is soreness and inflammation of your sinuses. Sinuses are hollow spaces in the bones around your face. Your sinuses are located:  Around your eyes.  In the middle of your forehead.  Behind your nose.  In your cheekbones. Your sinuses and nasal passages are lined with a stringy fluid (mucus). Mucus normally drains out of your sinuses. When your nasal tissues become inflamed or swollen, the mucus can become trapped or blocked so air cannot flow through your sinuses. This allows bacteria, viruses, and funguses to grow, which leads to infection. Sinusitis can develop quickly and last for 7?10 days (acute) or for more than 12 weeks (chronic). Sinusitis often develops after a cold. What are the causes? This condition is caused by anything that creates swelling in the sinuses or stops mucus from draining, including:  Allergies.  Asthma.  Bacterial or viral infection.  Abnormally shaped bones between the nasal passages.  Nasal growths that contain mucus (nasal polyps).  Narrow sinus openings.  Pollutants, such as chemicals or irritants in the air.  A foreign object stuck in the nose.  A fungal infection. This is rare. What increases the risk? The following factors may make you more likely to develop this condition:  Having allergies or asthma.  Having had a recent cold or respiratory tract infection.  Having structural deformities or blockages in your nose or sinuses.  Having a weak immune system.  Doing a lot of swimming or diving.  Overusing nasal sprays.  Smoking. What are the signs or symptoms? The main symptoms of this condition are pain and a feeling of pressure around the affected sinuses. Other symptoms include:  Upper  toothache.  Earache.  Headache.  Bad breath.  Decreased sense of smell and taste.  A cough that may get worse at night.  Fatigue.  Fever.  Thick drainage from your nose. The drainage is often green and it may contain pus (purulent).  Stuffy nose or congestion.  Postnasal drip. This is when extra mucus collects in the throat or back of the nose.  Swelling and warmth over the affected sinuses.  Sore throat.  Sensitivity to light. How is this diagnosed? This condition is diagnosed based on symptoms, a medical history, and a physical exam. To find out if your condition is acute or chronic, your health care provider may:  Look in your nose for signs of nasal polyps.  Tap over the affected sinus to check for signs of infection.  View the inside of your sinuses using an imaging device that has a light attached (endoscope). If your health care provider suspects that you have chronic sinusitis, you may also:  Be tested for allergies.  Have a sample of mucus taken from your nose (nasal culture) and checked for bacteria.  Have a mucus sample examined to see if your sinusitis is related to an allergy. If your sinusitis does not respond to treatment and it lasts longer than 8 weeks, you may have an MRI or CT scan to check your sinuses. These scans also help to determine how severe your infection is. In rare cases, a bone biopsy may be done to rule out more serious types of fungal sinus disease. How is this treated? Treatment for sinusitis depends on the cause and whether your condition is  chronic or acute. If a virus is causing your sinusitis, your symptoms will go away on their own within 10 days. You may be given medicines to relieve your symptoms, including:  Topical nasal decongestants. They shrink swollen nasal passages and let mucus drain from your sinuses.  Antihistamines. These drugs block inflammation that is triggered by allergies. This can help to ease swelling in your  nose and sinuses.  Topical nasal corticosteroids. These are nasal sprays that ease inflammation and swelling in your nose and sinuses.  Nasal saline washes. These rinses can help to get rid of thick mucus in your nose. If your condition is caused by bacteria, you will be given an antibiotic medicine. If your condition is caused by a fungus, you will be given an antifungal medicine. Surgery may be needed to correct underlying conditions, such as narrow nasal passages. Surgery may also be needed to remove polyps. Follow these instructions at home: Medicines  Take, use, or apply over-the-counter and prescription medicines only as told by your health care provider. These may include nasal sprays.  If you were prescribed an antibiotic medicine, take it as told by your health care provider. Do not stop taking the antibiotic even if you start to feel better. Hydrate and Humidify  Drink enough water to keep your urine clear or pale yellow. Staying hydrated will help to thin your mucus.  Use a cool mist humidifier to keep the humidity level in your home above 50%.  Inhale steam for 10-15 minutes, 3-4 times a day or as told by your health care provider. You can do this in the bathroom while a hot shower is running.  Limit your exposure to cool or dry air. Rest  Rest as much as possible.  Sleep with your head raised (elevated).  Make sure to get enough sleep each night. General instructions  Apply a warm, moist washcloth to your face 3-4 times a day or as told by your health care provider. This will help with discomfort.  Wash your hands often with soap and water to reduce your exposure to viruses and other germs. If soap and water are not available, use hand sanitizer.  Do not smoke. Avoid being around people who are smoking (secondhand smoke).  Keep all follow-up visits as told by your health care provider. This is important. Contact a health care provider if:  You have a  fever.  Your symptoms get worse.  Your symptoms do not improve within 10 days. Get help right away if:  You have a severe headache.  You have persistent vomiting.  You have pain or swelling around your face or eyes.  You have vision problems.  You develop confusion.  Your neck is stiff.  You have trouble breathing. This information is not intended to replace advice given to you by your health care provider. Make sure you discuss any questions you have with your health care provider. Document Released: 11/14/2005 Document Revised: 07/10/2016 Document Reviewed: 09/09/2015 Elsevier Interactive Patient Education  2017 [employer].

## 2017-01-09 NOTE — Progress Notes (Signed)
Patient ID: James Avila, male   DOB: 03/27/1951, 66 y.o.   MRN: [id number]     Subjective:  Patient ID: James Avila, male    DOB: 1951/11/12  Age: 66 y.o. MRN: [id number]  CC: No chief complaint on file.   HPI James Avila is a 66 y.o. male with a PMH of Hypertension that presents with a five-day history of nasal congestion, body aches, cough, blood-tinged mucus, and right eye pain. Cough is predominate at nighttime but has coughing spells during the day. Has taken over-the-counter cold and flu remedies. He believes they likely contain phenylephrine. Says he is feeling better compared to onset. Denies chest pain, shortness of breath, wheezing, headache, abdominal pain, current fever, rash, or GI/GU symptoms.    Outpatient Medications Prior to Visit  Medication Sig Dispense Refill  . aspirin 81 MG tablet Take 1 tablet (81 mg total) by mouth daily. 90 tablet 3  . carvedilol (COREG) 6.25 MG tablet Take 1 tablet (6.25 mg total) by mouth 2 (two) times daily with a meal. 180 tablet 3  . lisinopril-hydrochlorothiazide (ZESTORETIC) 20-12.5 MG tablet Take 2 tablets by mouth daily. 180 tablet 3  . amLODipine (NORVASC) 5 MG tablet Take 1 tablet (5 mg total) by mouth daily. (Patient not taking: Reported on 01/09/2017) 90 tablet 3  . cephALEXin (KEFLEX) 500 MG capsule Take 1 capsule (500 mg total) by mouth 2 (two) times daily. (Patient not taking: Reported on 01/09/2017) 20 capsule 0   No facility-administered medications prior to visit.      ROS Review of Systems  Constitutional: Negative for chills, fever and malaise/fatigue.  HENT: Positive for congestion and nosebleeds. Negative for ear pain, sinus pain and sore throat.   Eyes: Positive for pain (right). Negative for blurred vision.  Respiratory: Negative for shortness of breath.   Cardiovascular: Negative for chest pain and palpitations.  Gastrointestinal: Negative for abdominal pain and nausea.  Genitourinary: Negative for dysuria and  hematuria.  Musculoskeletal: Negative for joint pain and myalgias.  Skin: Negative for rash.  Neurological: Negative for tingling and headaches.  Psychiatric/Behavioral: Negative for depression. The patient is not nervous/anxious.     Objective:  BP (!) 151/89 (BP Location: Left Arm, Patient Position: Sitting, Cuff Size: Normal)   Pulse 81   Temp 98.3 F (36.8 C)   Resp 20   Wt 178 lb (80.7 kg)   SpO2 96%   BMI 24.83 kg/m   BP/Weight 01/09/2017 10/05/2016 [id number]  Systolic BP 123XX123 [id number]  Diastolic BP 89 75 79  Wt. (Lbs) 178 189 186  BMI 24.83 26.36 25.95      Physical Exam  Constitutional: He is oriented to person, place, and time.  Well developed, well nourished, NAD, polite  HENT:  Head: Normocephalic and atraumatic.  Turbinates hypertrophic and erythematous bilaterally with white rhinorrhea and tinges of blood. TM normal bilaterally. Oropharynx with cobblestoning changes, no exudates.  Eyes: No scleral icterus.  Neck: Normal range of motion. Neck supple. No thyromegaly present.  Cardiovascular: Normal rate, regular rhythm and normal heart sounds.   Pulmonary/Chest: Effort normal. He has no wheezes. He has no rales.  Abdominal: Soft. Bowel sounds are normal. There is no tenderness.  Musculoskeletal: He exhibits no edema.  Lymphadenopathy:    He has cervical adenopathy (Anterior cervical lymphadenopathy bilaterally).  Neurological: He is alert and oriented to person, place, and time.  Skin: Skin is warm and dry. No rash noted. No erythema. No pallor.  Psychiatric: He has a  normal mood and affect. His behavior is normal. Thought content normal.  Vitals reviewed.    Assessment & Plan:   1. Acute non-recurrent sinusitis, unspecified location  - amoxicillin-clavulanate (AUGMENTIN) 875-125 MG tablet; Take 1 tablet by mouth 2 (two) times daily.  Dispense: 20 tablet; Refill: 0 - DM-APAP-CPM (CORICIDIN HBP FLU) 15-500-2 MG TABS; Take 2 tablets by mouth every 6 (six)  hours.  Dispense: 1 each; Refill: 0 - sodium chloride (OCEAN) 0.65 % SOLN nasal spray; Place 1 spray into both nostrils as needed for congestion.  Dispense: 1 Bottle; Refill: 0  2. Essential hypertension -Patient has missed a couple of follow-up appointments in regards to hypertension. I have counseled patient on the importance of follow-up of his hypertension. I believe patient's blood pressure today is affected by the over-the-counter vasoconstrictors he has taken and by his illness. I have asked that he return next Friday for follow-up of his hypertension. No change in antihypertensives today.   Meds ordered this encounter  Medications  . amoxicillin-clavulanate (AUGMENTIN) 875-125 MG tablet    Sig: Take 1 tablet by mouth 2 (two) times daily.    Dispense:  20 tablet    Refill:  0    Order Specific Question:   Supervising Provider    Answer:   Tresa Garter [id number]  . DM-APAP-CPM (CORICIDIN HBP FLU) 15-500-2 MG TABS    Sig: Take 2 tablets by mouth every 6 (six) hours.    Dispense:  1 each    Refill:  0    Order Specific Question:   Supervising Provider    Answer:   Tresa Garter [id number]  . sodium chloride (OCEAN) 0.65 % SOLN nasal spray    Sig: Place 1 spray into both nostrils as needed for congestion.    Dispense:  1 Bottle    Refill:  0    Order Specific Question:   Supervising Provider    Answer:   Tresa Garter [id number]    Follow-up: Return in about 11 days (around 01/20/2017).   Clent Demark PA

## 2017-01-09 NOTE — Progress Notes (Signed)
Cough and congestion since last Wednesday, fatigue, weakness, back pain Concerns with UTI & Weight loss

## 2017-01-20 ENCOUNTER — Encounter: Payer: Self-pay | Admitting: Family Medicine

## 2017-01-20 ENCOUNTER — Ambulatory Visit: Payer: Self-pay | Attending: Family Medicine | Admitting: Family Medicine

## 2017-01-20 VITALS — BP 143/91 | HR 75 | Temp 97.4°F | Ht 71.0 in | Wt 178.2 lb

## 2017-01-20 DIAGNOSIS — N401 Enlarged prostate with lower urinary tract symptoms: Secondary | ICD-10-CM | POA: Insufficient documentation

## 2017-01-20 DIAGNOSIS — Z7982 Long term (current) use of aspirin: Secondary | ICD-10-CM | POA: Insufficient documentation

## 2017-01-20 DIAGNOSIS — M7580 Other shoulder lesions, unspecified shoulder: Secondary | ICD-10-CM

## 2017-01-20 DIAGNOSIS — J329 Chronic sinusitis, unspecified: Secondary | ICD-10-CM | POA: Insufficient documentation

## 2017-01-20 DIAGNOSIS — N3943 Post-void dribbling: Secondary | ICD-10-CM | POA: Insufficient documentation

## 2017-01-20 DIAGNOSIS — I10 Essential (primary) hypertension: Secondary | ICD-10-CM | POA: Insufficient documentation

## 2017-01-20 DIAGNOSIS — Z79899 Other long term (current) drug therapy: Secondary | ICD-10-CM | POA: Insufficient documentation

## 2017-01-20 DIAGNOSIS — K7460 Unspecified cirrhosis of liver: Secondary | ICD-10-CM | POA: Insufficient documentation

## 2017-01-20 DIAGNOSIS — F17210 Nicotine dependence, cigarettes, uncomplicated: Secondary | ICD-10-CM | POA: Insufficient documentation

## 2017-01-20 DIAGNOSIS — M778 Other enthesopathies, not elsewhere classified: Secondary | ICD-10-CM | POA: Insufficient documentation

## 2017-01-20 DIAGNOSIS — R1011 Right upper quadrant pain: Secondary | ICD-10-CM | POA: Insufficient documentation

## 2017-01-20 LAB — COMPLETE METABOLIC PANEL WITH GFR
ALT: 15 U/L (ref 9–46)
AST: 15 U/L (ref 10–35)
Albumin: 4.1 g/dL (ref 3.6–5.1)
Alkaline Phosphatase: 50 U/L (ref 40–115)
BILIRUBIN TOTAL: 0.8 mg/dL (ref 0.2–1.2)
BUN: 16 mg/dL (ref 7–25)
CALCIUM: 9.2 mg/dL (ref 8.6–10.3)
CO2: 27 mmol/L (ref 20–31)
Chloride: 102 mmol/L (ref 98–110)
Creat: 0.89 mg/dL (ref 0.70–1.25)
GFR, Est Non African American: >89 mL/min (ref >=60)
Glucose, Bld: 98 mg/dL (ref 65–99)
Potassium: 3.7 mmol/L (ref 3.5–5.3)
Sodium: 139 mmol/L (ref 135–146)
TOTAL PROTEIN: 7.2 g/dL (ref 6.1–8.1)

## 2017-01-20 LAB — CBC
HCT: 42.4 % (ref 38.5–50.0)
Hemoglobin: 14.5 g/dL (ref 13.2–17.1)
MCH: 30.7 pg (ref 27.0–33.0)
MCHC: 34.2 g/dL (ref 32.0–36.0)
MCV: 89.6 fL (ref 80.0–100.0)
MPV: 9.5 fL (ref 7.5–12.5)
PLATELETS: 267 10*3/uL (ref 140–400)
RBC: 4.73 MIL/uL (ref 4.20–5.80)
RDW: 13.1 % (ref 11.0–15.0)
WBC: 7.6 10*3/uL (ref 3.8–10.8)

## 2017-01-20 LAB — HEMOCCULT GUIAC POC 1CARD (OFFICE): Fecal Occult Blood, POC: NEGATIVE

## 2017-01-20 LAB — POCT INR: INR: 1

## 2017-01-20 MED ORDER — LISINOPRIL-HYDROCHLOROTHIAZIDE 20-12.5 MG PO TABS: 2.0000 | ORAL_TABLET | Freq: Every day | ORAL | 3 refills | Status: DC

## 2017-01-20 MED ORDER — CARVEDILOL 6.25 MG PO TABS
6.2500 mg | ORAL_TABLET | Freq: Two times a day (BID) | ORAL | 3 refills | Status: DC
Start: 2017-01-20 — End: 2017-07-03

## 2017-01-20 MED ORDER — TAMSULOSIN HCL 0.4 MG PO CAPS: 0.4000 mg | ORAL_CAPSULE | Freq: Every day | ORAL | 3 refills | Status: DC

## 2017-01-20 MED ORDER — NAPROXEN 500 MG PO TABS
500.0000 mg | ORAL_TABLET | Freq: Two times a day (BID) | ORAL | 0 refills | Status: DC
Start: 2017-01-20 — End: 2017-04-04

## 2017-01-20 MED FILL — ?TAMSULOSIN HCL 0.4 MG CAP: 0.4 | 30 days supply | Qty: 30 | Fill #0

## 2017-01-20 MED FILL — NAPROXEN 500 MG TABLET: 500 | 15 days supply | Qty: 30 | Fill #0

## 2017-01-20 NOTE — Assessment & Plan Note (Signed)
A: elevated in office Normal readings mostly at home P: Continue coreg and zestoretic Plan to taper up on coreg as needed for HTN

## 2017-01-20 NOTE — Patient Instructions (Addendum)
James Avila was seen today for sinusitis.  Diagnoses and all orders for this visit:  Cirrhosis of liver without ascites, unspecified hepatic cirrhosis type (HCC) -     COMPLETE METABOLIC PANEL WITH GFR -     CBC -     INR  Benign prostatic hyperplasia with post-void dribbling -     PSA -     tamsulosin (FLOMAX) 0.4 MG CAPS capsule; Take 1 capsule (0.4 mg total) by mouth daily. -     Hemoccult - 1 Card (office)  Deltoid tendonitis, unspecified laterality -     naproxen (NAPROSYN) 500 MG tablet; Take 1 tablet (500 mg total) by mouth 2 (two) times daily with a meal.   F/u in 6 weeks for BPH and shoulder pain  Dr. Adrian Blackwater    Shoulder Exercises Ask your health care provider which exercises are safe for you. Do exercises exactly as told by your health care provider and adjust them as directed. It is normal to feel mild stretching, pulling, tightness, or discomfort as you do these exercises, but you should stop right away if you feel sudden pain or your pain gets worse.Do not begin these exercises until told by your health care provider. RANGE OF MOTION EXERCISES  These exercises warm up your muscles and joints and improve the movement and flexibility of your shoulder. These exercises also help to relieve pain, numbness, and tingling. These exercises involve stretching your injured shoulder directly. Exercise A: Pendulum  1. Stand near a wall or a surface that you can hold onto for balance. 2. Bend at the waist and let your left / right arm hang straight down. Use your other arm to support you. Keep your back straight and do not lock your knees. 3. Relax your left / right arm and shoulder muscles, and move your hips and your trunk so your left / right arm swings freely. Your arm should swing because of the motion of your body, not because you are using your arm or shoulder muscles. 4. Keep moving your body so your arm swings in the following directions, as told by your health care provider:  Side  to side.  Forward and backward.  In clockwise and counterclockwise circles. 5. Continue each motion for __________ seconds, or for as long as told by your health care provider. 6. Slowly return to the starting position. Repeat __________ times. Complete this exercise __________ times a day. Exercise B:Flexion, Standing  1. Stand and hold a broomstick, a cane, or a similar object. Place your hands a little more than shoulder-width apart on the object. Your left / right hand should be palm-up, and your other hand should be palm-down. 2. Keep your elbow straight and keep your shoulder muscles relaxed. Push the stick down with your healthy arm to raise your left / right arm in front of your body, and then over your head until you feel a stretch in your shoulder.  Avoid shrugging your shoulder while you raise your arm. Keep your shoulder blade tucked down toward the middle of your back. 3. Hold for __________ seconds. 4. Slowly return to the starting position. Repeat __________ times. Complete this exercise __________ times a day. Exercise C: Abduction, Standing 1. Stand and hold a broomstick, a cane, or a similar object. Place your hands a little more than shoulder-width apart on the object. Your left / right hand should be palm-up, and your other hand should be palm-down. 2. While keeping your elbow straight and your shoulder muscles relaxed,  push the stick across your body toward your left / right side. Raise your left / right arm to the side of your body and then over your head until you feel a stretch in your shoulder.  Do not raise your arm above shoulder height, unless your health care provider tells you to do that.  Avoid shrugging your shoulder while you raise your arm. Keep your shoulder blade tucked down toward the middle of your back. 3. Hold for __________ seconds. 4. Slowly return to the starting position. Repeat __________ times. Complete this exercise __________ times a  day. Exercise D:Internal Rotation  1. Place your left / right hand behind your back, palm-up. 2. Use your other hand to dangle an exercise band, a towel, or a similar object over your shoulder. Grasp the band with your left / right hand so you are holding onto both ends. 3. Gently pull up on the band until you feel a stretch in the front of your left / right shoulder.  Avoid shrugging your shoulder while you raise your arm. Keep your shoulder blade tucked down toward the middle of your back. 4. Hold for __________ seconds. 5. Release the stretch by letting go of the band and lowering your hands. Repeat __________ times. Complete this exercise __________ times a day. STRETCHING EXERCISES  These exercises warm up your muscles and joints and improve the movement and flexibility of your shoulder. These exercises also help to relieve pain, numbness, and tingling. These exercises are done using your healthy shoulder to help stretch the muscles of your injured shoulder. Exercise E: Warehouse manager (External Rotation and Abduction)  1. Stand in a doorway with one of your feet slightly in front of the other. This is called a staggered stance. If you cannot reach your forearms to the door frame, stand facing a corner of a room. 2. Choose one of the following positions as told by your health care provider:  Place your hands and forearms on the door frame above your head.  Place your hands and forearms on the door frame at the height of your head.  Place your hands on the door frame at the height of your elbows. 3. Slowly move your weight onto your front foot until you feel a stretch across your chest and in the front of your shoulders. Keep your head and chest upright and keep your abdominal muscles tight. 4. Hold for __________ seconds. 5. To release the stretch, shift your weight to your back foot. Repeat __________ times. Complete this stretch __________ times a day. Exercise F:Extension,  Standing 1. Stand and hold a broomstick, a cane, or a similar object behind your back.  Your hands should be a little wider than shoulder-width apart.  Your palms should face away from your back. 2. Keeping your elbows straight and keeping your shoulder muscles relaxed, move the stick away from your body until you feel a stretch in your shoulder.  Avoid shrugging your shoulders while you move the stick. Keep your shoulder blade tucked down toward the middle of your back. 3. Hold for __________ seconds. 4. Slowly return to the starting position. Repeat __________ times. Complete this exercise __________ times a day. STRENGTHENING EXERCISES  These exercises build strength and endurance in your shoulder. Endurance is the ability to use your muscles for a long time, even after they get tired. Exercise G:External Rotation  1. Sit in a stable chair without armrests. 2. Secure an exercise band at elbow height on your left /  right side. 3. Place a soft object, such as a folded towel or a small pillow, between your left / right upper arm and your body to move your elbow a few inches away (about 10 cm) from your side. 4. Hold the end of the band so it is tight and there is no slack. 5. Keeping your elbow pressed against the soft object, move your left / right forearm out, away from your abdomen. Keep your body steady so only your forearm moves. 6. Hold for __________ seconds. 7. Slowly return to the starting position. Repeat __________ times. Complete this exercise __________ times a day. Exercise H:Shoulder Abduction  1. Sit in a stable chair without armrests, or stand. 2. Hold a __________ weight in your left / right hand, or hold an exercise band with both hands. 3. Start with your arms straight down and your left / right palm facing in, toward your body. 4. Slowly lift your left / right hand out to your side. Do not lift your hand above shoulder height unless your health care provider tells  you that this is safe.  Keep your arms straight.  Avoid shrugging your shoulder while you do this movement. Keep your shoulder blade tucked down toward the middle of your back. 5. Hold for __________ seconds. 6. Slowly lower your arm, and return to the starting position. Repeat __________ times. Complete this exercise __________ times a day. Exercise I:Shoulder Extension 1. Sit in a stable chair without armrests, or stand. 2. Secure an exercise band to a stable object in front of you where it is at shoulder height. 3. Hold one end of the exercise band in each hand. Your palms should face each other. 4. Straighten your elbows and lift your hands up to shoulder height. 5. Step back, away from the secured end of the exercise band, until the band is tight and there is no slack. 6. Squeeze your shoulder blades together as you pull your hands down to the sides of your thighs. Stop when your hands are straight down by your sides. Do not let your hands go behind your body. 7. Hold for __________ seconds. 8. Slowly return to the starting position. Repeat __________ times. Complete this exercise __________ times a day. Exercise J:Standing Shoulder Row 1. Sit in a stable chair without armrests, or stand. 2. Secure an exercise band to a stable object in front of you so it is at waist height. 3. Hold one end of the exercise band in each hand. Your palms should be in a thumbs-up position. 4. Bend each of your elbows to an "L" shape (about 90 degrees) and keep your upper arms at your sides. 5. Step back until the band is tight and there is no slack. 6. Slowly pull your elbows back behind you. 7. Hold for __________ seconds. 8. Slowly return to the starting position. Repeat __________ times. Complete this exercise __________ times a day. Exercise K:Shoulder Press-Ups  1. Sit in a stable chair that has armrests. Sit upright, with your feet flat on the floor. 2. Put your hands on the armrests so your  elbows are bent and your fingers are pointing forward. Your hands should be about even with the sides of your body. 3. Push down on the armrests and use your arms to lift yourself off of the chair. Straighten your elbows and lift yourself up as much as you comfortably can.  Move your shoulder blades down, and avoid letting your shoulders move up toward your ears.  Keep your feet on the ground. As you get stronger, your feet should support less of your body weight as you lift yourself up. 4. Hold for __________ seconds. 5. Slowly lower yourself back into the chair. Repeat __________ times. Complete this exercise __________ times a day. Exercise L: Wall Push-Ups  1. Stand so you are facing a stable wall. Your feet should be about one arm-length away from the wall. 2. Lean forward and place your palms on the wall at shoulder height. 3. Keep your feet flat on the floor as you bend your elbows and lean forward toward the wall. 4. Hold for __________ seconds. 5. Straighten your elbows to push yourself back to the starting position. Repeat __________ times. Complete this exercise __________ times a day. This information is not intended to replace advice given to you by your health care provider. Make sure you discuss any questions you have with your health care provider. Document Released: 09/28/2005 Document Revised: 08/08/2016 Document Reviewed: 07/26/2015 Elsevier Interactive Patient Education  2017 [employer].

## 2017-01-20 NOTE — Progress Notes (Signed)
Pt is here today to follow up on sinusitis. Pt states that he is having pain in his prostate area. Pt is having muscle pain in his right and left arms.

## 2017-01-20 NOTE — Assessment & Plan Note (Signed)
Cirrhosis Due for screening ultrasounds Lab obtained to assess MELD score

## 2017-01-20 NOTE — Assessment & Plan Note (Signed)
Enlarged prostate with BPH symptoms Plan: PSA Flomax

## 2017-01-20 NOTE — Assessment & Plan Note (Signed)
A: deltoid pain x 6 months no injury P: NSAID Home physical exercises

## 2017-01-20 NOTE — Progress Notes (Signed)
Subjective:  Patient ID: James Avila, male    DOB: 01-15-1951  Age: 66 y.o. MRN: [id number]  CC: Sinusitis   HPI James Avila has HTN, Hep C cirrhosis he presents for    1. F/u sinusitis: he took Augmentin for 5 days. He developed pain in his R upper quadrant pain and stopped the Augmentin. He still has congestion in head and chest. No fever, chills, fatigue. No headache.   2. Prostate pain: has known BPH. Has frequency at night. Hesitancy. Post void dribbling. He has prostate tenderness. He is not sexually active. No urethral discharge.   3. Shoulder pain: for past 6 months. bilateral. Mainly R, a bit on the left. Worse with reaching. Has muscle soreness. Denies joint pain.  No know injury.   4. HTN: taking coreg 6.25 mg BID, taking Zestoretic 2 tabs daily. Not taking norvasc. Home BP reading  98-140/64-94. Pulse 69-93. No HA, CP, SOB or leg swelling.   5. Hep C cirrhosis: he drinks 2, 16 oz  beer twice weekly when he shoots pool.   Social History  Substance Use Topics  . Smoking status: Current Every Day Smoker    Packs/day: 0.50    Types: Cigarettes  . Smokeless tobacco: Never Used     Comment: Smoking 5-10 cigs/day  . Alcohol use 2.4 oz/week    4 Cans of beer per week     Comment: weekly    Outpatient Medications Prior to Visit  Medication Sig Dispense Refill  . aspirin 81 MG tablet Take 1 tablet (81 mg total) by mouth daily. 90 tablet 3  . carvedilol (COREG) 6.25 MG tablet Take 1 tablet (6.25 mg total) by mouth 2 (two) times daily with a meal. 180 tablet 3  . lisinopril-hydrochlorothiazide (ZESTORETIC) 20-12.5 MG tablet Take 2 tablets by mouth daily. 180 tablet 3  . sodium chloride (OCEAN) 0.65 % SOLN nasal spray Place 1 spray into both nostrils as needed for congestion. 1 Bottle 0  . amLODipine (NORVASC) 5 MG tablet Take 1 tablet (5 mg total) by mouth daily. (Patient not taking: Reported on 01/09/2017) 90 tablet 3  . cephALEXin (KEFLEX) 500 MG capsule Take 1 capsule  (500 mg total) by mouth 2 (two) times daily. (Patient not taking: Reported on 01/09/2017) 20 capsule 0   No facility-administered medications prior to visit.     ROS Review of Systems  Constitutional: Negative for chills, fatigue, fever and unexpected weight change.  Eyes: Negative for visual disturbance.  Respiratory: Negative for cough and shortness of breath.   Cardiovascular: Negative for chest pain, palpitations and leg swelling.  Gastrointestinal: Negative for abdominal pain, blood in stool, constipation, diarrhea, nausea and vomiting.  Endocrine: Negative for polydipsia, polyphagia and polyuria.  Genitourinary: Positive for difficulty urinating and frequency.  Musculoskeletal: Positive for arthralgias and myalgias. Negative for back pain, gait problem and neck pain.  Skin: Negative for rash.  Allergic/Immunologic: Negative for immunocompromised state.  Hematological: Negative for adenopathy. Does not bruise/bleed easily.  Psychiatric/Behavioral: Negative for dysphoric mood, sleep disturbance and suicidal ideas. The patient is not nervous/anxious.     Objective:  BP (!) 143/91 (BP Location: Left Arm, Patient Position: Sitting, Cuff Size: Small)   Pulse 75   Temp 97.4 F (36.3 C) (Oral)   Ht 5\' 11"  (1.803 m)   Wt 178 lb 3.2 oz (80.8 kg)   SpO2 96%   BMI 24.85 kg/m   BP/Weight 01/20/2017 01/09/2017 [id number]  Systolic BP [id number]  Diastolic BP  91 89 75  Wt. (Lbs) 178.2 178 189  BMI 24.85 24.83 26.36   Physical Exam  Constitutional: He appears well-developed and well-nourished. No distress.  HENT:  Head: Normocephalic and atraumatic.  Neck: Normal range of motion. Neck supple.  Cardiovascular: Normal rate, regular rhythm, normal heart sounds and intact distal pulses.   Pulmonary/Chest: Effort normal and breath sounds normal.  Genitourinary: Rectal exam shows guaiac negative stool. Prostate is enlarged (sllightly ) and tender.  Musculoskeletal: He exhibits no edema.        Right shoulder: He exhibits decreased range of motion and tenderness (lateral shoulder worse with abduction of arm ).       Left shoulder: He exhibits tenderness.  Neurological: He is alert.  Skin: Skin is warm and dry. No rash noted. No erythema.  Psychiatric: He has a normal mood and affect.     Assessment & Plan:  James Avila was seen today for sinusitis.  Diagnoses and all orders for this visit:  Cirrhosis of liver without ascites, unspecified hepatic cirrhosis type (HCC) -     COMPLETE METABOLIC PANEL WITH GFR -     CBC -     INR  Benign prostatic hyperplasia with post-void dribbling -     PSA -     tamsulosin (FLOMAX) 0.4 MG CAPS capsule; Take 1 capsule (0.4 mg total) by mouth daily. -     Hemoccult - 1 Card (office)  Deltoid tendonitis, unspecified laterality -     naproxen (NAPROSYN) 500 MG tablet; Take 1 tablet (500 mg total) by mouth 2 (two) times daily with a meal.  Essential hypertension -     carvedilol (COREG) 6.25 MG tablet; Take 1 tablet (6.25 mg total) by mouth 2 (two) times daily with a meal. -     lisinopril-hydrochlorothiazide (ZESTORETIC) 20-12.5 MG tablet; Take 2 tablets by mouth daily.   There are no diagnoses linked to this encounter.  No orders of the defined types were placed in this encounter.   Follow-up: No Follow-up on file.   Boykin Nearing MD

## 2017-01-21 LAB — PSA: PSA: 0.6 ng/mL (ref <=4.0)

## 2017-02-14 MED FILL — CARVEDILOL 6.25 MG TABLET: 6.25 | 90 days supply | Qty: 180 | Fill #3

## 2017-02-14 MED FILL — TAMSULOSIN HCL 0.4 MG CAP: 0.4 | 90 days supply | Qty: 90 | Fill #1

## 2017-03-06 ENCOUNTER — Ambulatory Visit: Payer: Self-pay | Attending: Family Medicine | Admitting: Family Medicine

## 2017-03-06 ENCOUNTER — Encounter: Payer: Self-pay | Admitting: Family Medicine

## 2017-03-06 VITALS — BP 143/93 | HR 74 | Temp 97.7°F | Wt 178.0 lb

## 2017-03-06 DIAGNOSIS — M79673 Pain in unspecified foot: Secondary | ICD-10-CM | POA: Insufficient documentation

## 2017-03-06 DIAGNOSIS — F17210 Nicotine dependence, cigarettes, uncomplicated: Secondary | ICD-10-CM | POA: Insufficient documentation

## 2017-03-06 DIAGNOSIS — M7580 Other shoulder lesions, unspecified shoulder: Secondary | ICD-10-CM

## 2017-03-06 DIAGNOSIS — M722 Plantar fascial fibromatosis: Secondary | ICD-10-CM

## 2017-03-06 DIAGNOSIS — K7460 Unspecified cirrhosis of liver: Secondary | ICD-10-CM | POA: Insufficient documentation

## 2017-03-06 DIAGNOSIS — N401 Enlarged prostate with lower urinary tract symptoms: Secondary | ICD-10-CM

## 2017-03-06 DIAGNOSIS — M25519 Pain in unspecified shoulder: Secondary | ICD-10-CM | POA: Insufficient documentation

## 2017-03-06 DIAGNOSIS — Z0000 Encounter for general adult medical examination without abnormal findings: Secondary | ICD-10-CM

## 2017-03-06 DIAGNOSIS — N3943 Post-void dribbling: Secondary | ICD-10-CM

## 2017-03-06 DIAGNOSIS — Z79899 Other long term (current) drug therapy: Secondary | ICD-10-CM | POA: Insufficient documentation

## 2017-03-06 DIAGNOSIS — Z7982 Long term (current) use of aspirin: Secondary | ICD-10-CM | POA: Insufficient documentation

## 2017-03-06 DIAGNOSIS — I10 Essential (primary) hypertension: Secondary | ICD-10-CM | POA: Insufficient documentation

## 2017-03-06 MED ORDER — TAMSULOSIN HCL 0.4 MG PO CAPS: 0.8000 mg | ORAL_CAPSULE | Freq: Every day | ORAL | 2 refills | Status: DC

## 2017-03-06 NOTE — Assessment & Plan Note (Signed)
History and exam consistent with plantar fasciitis Plan: Ice  Home PT

## 2017-03-06 NOTE — Patient Instructions (Addendum)
Diagnoses and all orders for this visit:  Deltoid tendonitis, unspecified laterality -     Ambulatory referral to Sports Medicine  Benign prostatic hyperplasia with post-void dribbling -     tamsulosin (FLOMAX) 0.4 MG CAPS capsule; Take 2 capsules (0.8 mg total) by mouth daily after supper.  Healthcare maintenance -     Ambulatory referral to Gastroenterology   Ice feet for 15 minutes 2-3 times per day by doing the following  1. Freezing water in plastic bottle and rolling bottle under foot Or Placing an ice pack under under heel   f/u in 4 weeks for liquid nitrogen treatment for seborrheic keratosis and plantar wart and for skin tag removal, 30 minute visit   Dr. Adrian Blackwater    Plantar Fasciitis Rehab Ask your health care provider which exercises are safe for you. Do exercises exactly as told by your health care provider and adjust them as directed. It is normal to feel mild stretching, pulling, tightness, or discomfort as you do these exercises, but you should stop right away if you feel sudden pain or your pain gets worse. Do not begin these exercises until told by your health care provider. Stretching and range of motion exercises These exercises warm up your muscles and joints and improve the movement and flexibility of your foot. These exercises also help to relieve pain. Exercise A: Plantar fascia stretch   1. Sit with your left / right leg crossed over your opposite knee. 2. Hold your heel with one hand with that thumb near your arch. With your other hand, hold your toes and gently pull them back toward the top of your foot. You should feel a stretch on the bottom of your toes or your foot or both. 3. Hold this stretch for__________ seconds. 4. Slowly release your toes and return to the starting position. Repeat __________ times. Complete this exercise __________ times a day. Exercise B: Gastroc, standing   1. Stand with your hands against a wall. 2. Extend your left / right  leg behind you, and bend your front knee slightly. 3. Keeping your heels on the floor and keeping your back knee straight, shift your weight toward the wall without arching your back. You should feel a gentle stretch in your left / right calf. 4. Hold this position for __________ seconds. Repeat __________ times. Complete this exercise __________ times a day. Exercise C: Soleus, standing  1. Stand with your hands against a wall. 2. Extend your left / right leg behind you, and bend your front knee slightly. 3. Keeping your heels on the floor, bend your back knee and slightly shift your weight over the back leg. You should feel a gentle stretch deep in your calf. 4. Hold this position for __________ seconds. Repeat __________ times. Complete this exercise __________ times a day. Exercise D: Gastrocsoleus, standing  1. Stand with the ball of your left / right foot on a step. The ball of your foot is on the walking surface, right under your toes. 2. Keep your other foot firmly on the same step. 3. Hold onto the wall or a railing for balance. 4. Slowly lift your other foot, allowing your body weight to press your heel down over the edge of the step. You should feel a stretch in your left / right calf. 5. Hold this position for __________ seconds. 6. Return both feet to the step. 7. Repeat this exercise with a slight bend in your left / right knee. Repeat __________ times with  your left / right knee straight and __________ times with your left / right knee bent. Complete this exercise __________ times a day. Balance exercise This exercise builds your balance and strength control of your arch to help take pressure off your plantar fascia. Exercise E: Single leg stand  1. Without shoes, stand near a railing or in a doorway. You may hold onto the railing or door frame as needed. 2. Stand on your left / right foot. Keep your big toe down on the floor and try to keep your arch lifted. Do not let your  foot roll inward. 3. Hold this position for __________ seconds. 4. If this exercise is too easy, you can try it with your eyes closed or while standing on a pillow. Repeat __________ times. Complete this exercise __________ times a day. This information is not intended to replace advice given to you by your health care provider. Make sure you discuss any questions you have with your health care provider. Document Released: 11/14/2005 Document Revised: 07/19/2016 Document Reviewed: 09/28/2015 Elsevier Interactive Patient Education  2017 [employer].

## 2017-03-06 NOTE — Progress Notes (Signed)
Pt is having right shoulder and left foot pain.

## 2017-03-06 NOTE — Progress Notes (Addendum)
Subjective:  Patient ID: James Avila, male    DOB: 29-Sep-1951  Age: 66 y.o. MRN: [id number]  CC: Benign Prostatic Hypertrophy; Shoulder Pain; and Foot Pain   HPI BILAL MANZER has HTN, Hep C cirrhosis he presents for    1. BPH: less hesitancy and frequency with flomax. No dizziness or lightheadedness.   2. Shoulder pain: for past 8 months. bilateral. Mainly R, a bit on the left. Worse with reaching. Has muscle soreness. Denies joint pain.  No know injury. NSAID and home exercises have not helped with his pain.   3. HTN: taking coreg 6.25 mg BID, taking Zestoretic 2 tabs daily. Not taking norvasc. No HA, CP, SOB or leg swelling.   4. Left heel pain: x 4 weeks. Worse with walking and when stepping out of bed. Had similar pain about 1 year ago. No injury. No redness or swelling. He works part time as a delivery person. He wears comfortabl [employer] shoes.   Social History  Substance Use Topics  . Smoking status: Current Every Day Smoker    Packs/day: 0.50    Types: Cigarettes  . Smokeless tobacco: Never Used     Comment: Smoking 5-10 cigs/day  . Alcohol use 2.4 oz/week    4 Cans of beer per week     Comment: weekly    Outpatient Medications Prior to Visit  Medication Sig Dispense Refill  . aspirin 81 MG tablet Take 1 tablet (81 mg total) by mouth daily. 90 tablet 3  . carvedilol (COREG) 6.25 MG tablet Take 1 tablet (6.25 mg total) by mouth 2 (two) times daily with a meal. 180 tablet 3  . lisinopril-hydrochlorothiazide (ZESTORETIC) 20-12.5 MG tablet Take 2 tablets by mouth daily. 180 tablet 3  . naproxen (NAPROSYN) 500 MG tablet Take 1 tablet (500 mg total) by mouth 2 (two) times daily with a meal. 30 tablet 0  . sodium chloride (OCEAN) 0.65 % SOLN nasal spray Place 1 spray into both nostrils as needed for congestion. 1 Bottle 0  . tamsulosin (FLOMAX) 0.4 MG CAPS capsule Take 1 capsule (0.4 mg total) by mouth daily. 30 capsule 3   No facility-administered medications prior  to visit.     ROS Review of Systems  Constitutional: Negative for chills, fatigue, fever and unexpected weight change.  Eyes: Negative for visual disturbance.  Respiratory: Negative for cough and shortness of breath.   Cardiovascular: Negative for chest pain, palpitations and leg swelling.  Gastrointestinal: Negative for abdominal pain, blood in stool, constipation, diarrhea, nausea and vomiting.  Endocrine: Negative for polydipsia, polyphagia and polyuria.  Genitourinary: Positive for frequency. Negative for difficulty urinating.  Musculoskeletal: Positive for arthralgias and myalgias. Negative for back pain, gait problem and neck pain.  Skin: Negative for rash.  Allergic/Immunologic: Negative for immunocompromised state.  Hematological: Negative for adenopathy. Does not bruise/bleed easily.  Psychiatric/Behavioral: Negative for dysphoric mood, sleep disturbance and suicidal ideas. The patient is not nervous/anxious.     Objective:  BP (!) 143/93   Pulse 74   Temp 97.7 F (36.5 C) (Oral)   Wt 178 lb (80.7 kg)   SpO2 96%   BMI 24.83 kg/m   BP/Weight 03/06/2017 01/20/2017 5/42/7062  Systolic BP 376 283 151  Diastolic BP 93 91 89  Wt. (Lbs) 178 178.2 178  BMI 24.83 24.85 24.83   Physical Exam  Constitutional: He appears well-developed and well-nourished. No distress.  HENT:  Head: Normocephalic and atraumatic.  Neck: Normal range of motion. Neck supple.  Cardiovascular: Normal rate, regular rhythm, normal heart sounds and intact distal pulses.   Pulmonary/Chest: Effort normal and breath sounds normal.  Musculoskeletal: He exhibits no edema.       Right shoulder: He exhibits decreased range of motion and tenderness (lateral shoulder worse with abduction of arm ).       Left shoulder: He exhibits tenderness.       Right ankle: He exhibits decreased range of motion. He exhibits no swelling, no ecchymosis, no deformity, no laceration and normal pulse.       Feet:  Neurological:  He is alert.  Skin: Skin is warm and dry. No rash noted. No erythema.  Psychiatric: He has a normal mood and affect.     Assessment & Plan:  Keziah was seen today for benign prostatic hypertrophy, shoulder pain and foot pain.  Diagnoses and all orders for this visit:  Deltoid tendonitis, unspecified laterality -     Ambulatory referral to Sports Medicine  Benign prostatic hyperplasia with post-void dribbling -     tamsulosin (FLOMAX) 0.4 MG CAPS capsule; Take 2 capsules (0.8 mg total) by mouth daily after supper.  Healthcare maintenance -     Ambulatory referral to Gastroenterology  Plantar fasciitis, left   There are no diagnoses linked to this encounter.  No orders of the defined types were placed in this encounter.   Follow-up: Return in about 4 weeks (around 04/03/2017) for skin tag removal and cryotheray .   Boykin Nearing MD

## 2017-03-06 NOTE — Assessment & Plan Note (Signed)
A: improved with flomax P: Increase flomax dose to 0.8 mg nightly

## 2017-03-06 NOTE — Assessment & Plan Note (Signed)
Persistent pain despite NSAID and home PT Plan; Sports medicine referral for [place] evaluation and possible steroid injection

## 2017-03-20 ENCOUNTER — Ambulatory Visit: Payer: Self-pay

## 2017-03-23 ENCOUNTER — Ambulatory Visit: Payer: Self-pay | Attending: Family Medicine

## 2017-04-04 ENCOUNTER — Ambulatory Visit: Payer: Self-pay | Attending: Family Medicine | Admitting: Family Medicine

## 2017-04-04 ENCOUNTER — Other Ambulatory Visit: Payer: Self-pay | Admitting: Family Medicine

## 2017-04-04 ENCOUNTER — Encounter: Payer: Self-pay | Admitting: Family Medicine

## 2017-04-04 VITALS — BP 119/71 | HR 72 | Temp 97.4°F | Wt 173.0 lb

## 2017-04-04 DIAGNOSIS — F17210 Nicotine dependence, cigarettes, uncomplicated: Secondary | ICD-10-CM | POA: Insufficient documentation

## 2017-04-04 DIAGNOSIS — G4700 Insomnia, unspecified: Secondary | ICD-10-CM

## 2017-04-04 DIAGNOSIS — L918 Other hypertrophic disorders of the skin: Secondary | ICD-10-CM

## 2017-04-04 DIAGNOSIS — Z7982 Long term (current) use of aspirin: Secondary | ICD-10-CM | POA: Insufficient documentation

## 2017-04-04 DIAGNOSIS — B079 Viral wart, unspecified: Secondary | ICD-10-CM | POA: Insufficient documentation

## 2017-04-04 DIAGNOSIS — M7580 Other shoulder lesions, unspecified shoulder: Secondary | ICD-10-CM

## 2017-04-04 MED ORDER — ZOLPIDEM TARTRATE 10 MG PO TABS: 10.0000 mg | ORAL_TABLET | Freq: Every evening | ORAL | 0 refills | Status: DC | PRN

## 2017-04-04 MED FILL — LISINOPRIL-HCTZ 20-12.5 MG: 20-12.5 | 90 days supply | Qty: 180 | Fill #0

## 2017-04-04 NOTE — Progress Notes (Signed)
Subjective:  Patient ID: James Avila, male    DOB: 10-Nov-1951  Age: 66 y.o. MRN: [id number]  CC: Skin Problem   HPI MACKY GALIK presents for   1. Skin tag in gluteal area: request removal. Painless. Reports that the tag makes it hard to properly clean.   2. Sleep disturbance: reports trouble sleeping. Is taking benadryl but reports it is now less effective.   Social History  Substance Use Topics  . Smoking status: Current Every Day Smoker    Packs/day: 0.50    Types: Cigarettes  . Smokeless tobacco: Never Used     Comment: Smoking 5-10 cigs/day  . Alcohol use 2.4 oz/week    4 Cans of beer per week     Comment: weekly    Outpatient Medications Prior to Visit  Medication Sig Dispense Refill  . aspirin 81 MG tablet Take 1 tablet (81 mg total) by mouth daily. 90 tablet 3  . carvedilol (COREG) 6.25 MG tablet Take 1 tablet (6.25 mg total) by mouth 2 (two) times daily with a meal. 180 tablet 3  . lisinopril-hydrochlorothiazide (ZESTORETIC) 20-12.5 MG tablet Take 2 tablets by mouth daily. 180 tablet 3  . naproxen (NAPROSYN) 500 MG tablet Take 1 tablet (500 mg total) by mouth 2 (two) times daily with a meal. 30 tablet 0  . sodium chloride (OCEAN) 0.65 % SOLN nasal spray Place 1 spray into both nostrils as needed for congestion. 1 Bottle 0  . tamsulosin (FLOMAX) 0.4 MG CAPS capsule Take 2 capsules (0.8 mg total) by mouth daily after supper. 60 capsule 2   No facility-administered medications prior to visit.     ROS Review of Systems  Constitutional: Negative for chills, fatigue, fever and unexpected weight change.  Eyes: Negative for visual disturbance.  Respiratory: Negative for cough and shortness of breath.   Cardiovascular: Negative for chest pain, palpitations and leg swelling.  Gastrointestinal: Negative for abdominal pain, blood in stool, constipation, diarrhea, nausea and vomiting.  Endocrine: Negative for polydipsia, polyphagia and polyuria.  Genitourinary: Positive  for frequency. Negative for difficulty urinating.  Musculoskeletal: Positive for arthralgias and myalgias. Negative for back pain, gait problem and neck pain.  Skin: Negative for rash.  Allergic/Immunologic: Negative for immunocompromised state.  Hematological: Negative for adenopathy. Does not bruise/bleed easily.  Psychiatric/Behavioral: Positive for sleep disturbance. Negative for dysphoric mood and suicidal ideas. The patient is not nervous/anxious.     Objective:  BP 119/71   Pulse 72   Temp 97.4 F (36.3 C) (Oral)   Wt 173 lb (78.5 kg)   SpO2 96%   BMI 24.13 kg/m   BP/Weight 04/04/2017 03/06/2017 1/82/9937  Systolic BP 169 678 938  Diastolic BP 71 93 91  Wt. (Lbs) 173 178 178.2  BMI 24.13 24.83 24.85     Physical Exam Skin Tag Removal Procedure Note  Pre-operative Diagnosis: Classic skin tags (acrochordon)  Post-operative Diagnosis: verruca   Locations: upper gluteal cleft  Indications: irritation  Anesthesia: Lidocaine 1% with epinephrine without added sodium bicarbonate  Procedure Details  The risks (including bleeding and infection) and benefits of the procedure and Written informed consent obtained. Using sterile iris scissors, multiple skin tags were snipped off at their bases after cleansing with Betadine.  Bleeding was controlled by pressure.   Findings: Pathognomonic benign lesions  not sent for pathological exam.  Condition: Stable  Complications: none.  Plan: 1. Instructed to keep the wounds dry and covered for 24-48h and clean thereafter. 2. Warning signs of  infection were reviewed.   3. Recommended that the patient use OTC analgesics as needed for pain.  4. Return as needed.  Assessment & Plan:  Fran was seen today for skin problem.  Diagnoses and all orders for this visit:  Skin tag  Insomnia, unspecified type -     zolpidem (AMBIEN) 10 MG tablet; Take 1 tablet (10 mg total) by mouth at bedtime as needed for sleep.   There are no  diagnoses linked to this encounter.  No orders of the defined types were placed in this encounter.   Follow-up: Return in about 3 weeks (around 04/25/2017) for c ryotherapy.   Boykin Nearing MD

## 2017-04-04 NOTE — Patient Instructions (Addendum)
Diagnoses and all orders for this visit:  Skin tag  Insomnia, unspecified type -     zolpidem (AMBIEN) 10 MG tablet; Take 1 tablet (10 mg total) by mouth at bedtime as needed for sleep.   Ambien for trouble sleeping  take ibuprofen or tylenol for any pain Keep area covered with bandaid between bathing   f/u in 2-3 weeks for cryotherapy for foot wart  Dr. Adrian Blackwater  Skin Tag, Adult A skin tag (acrochordon) is a soft, extra growth of skin. Most skin tags are flesh-colored and rarely bigger than a pencil eraser. They commonly form near areas where there are folds in the skin, such as the armpit or groin. Skin tags are not dangerous, and they do not spread from person to person (are not contagious). You may have one skin tag or several. Skin tags do not require treatment. However, your health care provider may recommend removal of a skin tag if it:  Gets irritated from clothing.  Bleeds.  Is visible and unsightly. Your health care provider can remove skin tags with a simple surgical procedure or a procedure that involves freezing the skin tag. Follow these instructions at home:  Watch for any changes in your skin tag. A normal skin tag does not require any other special care at home.  Take over-the-counter and prescription medicines only as told by your health care provider.  Keep all follow-up visits as told by your health care provider. This is important. Contact a health care provider if:  You have a skin tag that:  Becomes painful.  Changes color.  Bleeds.  Swells.  You develop more skin tags. This information is not intended to replace advice given to you by your health care provider. Make sure you discuss any questions you have with your health care provider. Document Released: 11/29/2015 Document Revised: 07/10/2016 Document Reviewed: 11/29/2015 Elsevier Interactive Patient Education  2017 [employer].

## 2017-04-05 MED FILL — NAPROXEN 500 MG TABLET: 500 | 15 days supply | Qty: 30 | Fill #0

## 2017-04-06 ENCOUNTER — Encounter: Payer: Self-pay | Admitting: Family Medicine

## 2017-04-06 DIAGNOSIS — G4700 Insomnia, unspecified: Secondary | ICD-10-CM | POA: Insufficient documentation

## 2017-04-06 NOTE — Assessment & Plan Note (Signed)
insomnia Ambien trial

## 2017-04-06 NOTE — Assessment & Plan Note (Signed)
Report tag that is actually verruca No pain or bleeding Excision done  No pathology requested

## 2017-04-12 ENCOUNTER — Encounter: Payer: Self-pay | Admitting: Family Medicine

## 2017-04-12 MED FILL — TAMSULOSIN HCL 0.4 MG CAP: 0.4 | 90 days supply | Qty: 180 | Fill #0

## 2017-05-02 ENCOUNTER — Encounter: Payer: Self-pay | Admitting: Family Medicine

## 2017-05-02 ENCOUNTER — Ambulatory Visit: Payer: Self-pay | Attending: Family Medicine | Admitting: Family Medicine

## 2017-05-02 DIAGNOSIS — G4700 Insomnia, unspecified: Secondary | ICD-10-CM | POA: Insufficient documentation

## 2017-05-02 DIAGNOSIS — I10 Essential (primary) hypertension: Secondary | ICD-10-CM | POA: Insufficient documentation

## 2017-05-02 DIAGNOSIS — K7460 Unspecified cirrhosis of liver: Secondary | ICD-10-CM | POA: Insufficient documentation

## 2017-05-02 DIAGNOSIS — Z7982 Long term (current) use of aspirin: Secondary | ICD-10-CM | POA: Insufficient documentation

## 2017-05-02 DIAGNOSIS — L821 Other seborrheic keratosis: Secondary | ICD-10-CM | POA: Insufficient documentation

## 2017-05-02 DIAGNOSIS — Z79899 Other long term (current) drug therapy: Secondary | ICD-10-CM | POA: Insufficient documentation

## 2017-05-02 DIAGNOSIS — F17210 Nicotine dependence, cigarettes, uncomplicated: Secondary | ICD-10-CM | POA: Insufficient documentation

## 2017-05-02 MED ORDER — ZOLPIDEM TARTRATE 10 MG PO TABS
10.0000 mg | ORAL_TABLET | Freq: Every evening | ORAL | 1 refills | Status: DC | PRN
Start: 2017-05-02 — End: 2017-07-03

## 2017-05-02 NOTE — Assessment & Plan Note (Signed)
A: improved with Ambien P: Refilled Ambien

## 2017-05-02 NOTE — Patient Instructions (Addendum)
Kimberly was seen today for seborrheic keratosis and insomnia.  Diagnoses and all orders for this visit:  Insomnia, unspecified type -     zolpidem (AMBIEN) 10 MG tablet; Take 1 tablet (10 mg total) by mouth at bedtime as needed for sleep.  Seborrheic keratoses   Cryotherapy was done on your forehead today Expect redness, small blister and scab  Return to 4 weeks for repeat treatment if needed Otherwise return in 3 months for HTN  Dr. Adrian Blackwater

## 2017-05-02 NOTE — Progress Notes (Signed)
Subjective:  Patient ID: James Avila, male    DOB: 02-Aug-1951  Age: 66 y.o. MRN: [id number]  CC: Seborrheic Keratosis and Insomnia   HPI BABE CLENNEY has HTN, cirrhosis, Hep C, cirrhosis he presents for    1.  Insomnia: he has tried Ambien after the last visit. It helped. He does not take it every night. He request 90 day supply Rx as he can purchase it for $14 at [employer].   2. seborrheic keratoes: painless. On forehead and supraclavicular area. No bleeding or itching.   3. HM: he has been referred to GI for screening c-scope. He plan to schedule at his earliest convenience.   Social History  Substance Use Topics  . Smoking status: Current Every Day Smoker    Packs/day: 0.50    Types: Cigarettes  . Smokeless tobacco: Never Used     Comment: Smoking 5-10 cigs/day  . Alcohol use 2.4 oz/week    4 Cans of beer per week     Comment: weekly    Outpatient Medications Prior to Visit  Medication Sig Dispense Refill  . aspirin 81 MG tablet Take 1 tablet (81 mg total) by mouth daily. 90 tablet 3  . carvedilol (COREG) 6.25 MG tablet Take 1 tablet (6.25 mg total) by mouth 2 (two) times daily with a meal. 180 tablet 3  . lisinopril-hydrochlorothiazide (ZESTORETIC) 20-12.5 MG tablet Take 2 tablets by mouth daily. 180 tablet 3  . naproxen (NAPROSYN) 500 MG tablet TAKE 1 TABLET BY MOUTH 2 TIMES DAILY WITH A MEAL 30 tablet 0  . sodium chloride (OCEAN) 0.65 % SOLN nasal spray Place 1 spray into both nostrils as needed for congestion. 1 Bottle 0  . tamsulosin (FLOMAX) 0.4 MG CAPS capsule Take 2 capsules (0.8 mg total) by mouth daily after supper. 60 capsule 2  . zolpidem (AMBIEN) 10 MG tablet Take 1 tablet (10 mg total) by mouth at bedtime as needed for sleep. 30 tablet 0   No facility-administered medications prior to visit.     ROS Review of Systems  Constitutional: Negative for chills, fatigue, fever and unexpected weight change.  Eyes: Negative for visual disturbance.    Respiratory: Negative for cough and shortness of breath.   Cardiovascular: Negative for chest pain, palpitations and leg swelling.  Gastrointestinal: Negative for abdominal pain, blood in stool, constipation, diarrhea, nausea and vomiting.  Endocrine: Negative for polydipsia, polyphagia and polyuria.  Musculoskeletal: Positive for back pain. Negative for arthralgias, gait problem, myalgias and neck pain.  Skin: Negative for rash.  Allergic/Immunologic: Negative for immunocompromised state.  Hematological: Negative for adenopathy. Does not bruise/bleed easily.  Psychiatric/Behavioral: Negative for dysphoric mood, sleep disturbance and suicidal ideas. The patient is not nervous/anxious.     Objective:  BP 121/74   Pulse 70   Temp 97.5 F (36.4 C) (Oral)   Wt 182 lb (82.6 kg)   SpO2 96%   BMI 25.38 kg/m   BP/Weight 05/02/2017 5/0/0370 03/05/8890  Systolic BP 694 503 888  Diastolic BP 74 71 93  Wt. (Lbs) 182 173 178  BMI 25.38 24.13 24.83   Physical Exam  Constitutional: He appears well-developed and well-nourished. No distress.  HENT:  Head: Normocephalic and atraumatic.    Neck: Normal range of motion. Neck supple.  Cardiovascular: Normal rate, regular rhythm, normal heart sounds and intact distal pulses.   Pulmonary/Chest: Effort normal and breath sounds normal.  Musculoskeletal: He exhibits no edema.  Neurological: He is alert.  Skin: Skin is warm  and dry. No rash noted. No erythema.     Psychiatric: He has a normal mood and affect.    Treated scalp and supraclavicular seborrheic keratoses with cryotherapy x 3 freeze-thaw cycles  Assessment & Plan:  Hanif was seen today for seborrheic keratosis and insomnia.  Diagnoses and all orders for this visit:  Insomnia, unspecified type -     zolpidem (AMBIEN) 10 MG tablet; Take 1 tablet (10 mg total) by mouth at bedtime as needed for sleep.  Seborrheic keratoses   There are no diagnoses linked to this encounter. There are  no diagnoses linked to this encounter. No orders of the defined types were placed in this encounter.   Follow-up: Return in about 4 weeks (around 05/30/2017) for repeat cryotherapy.   Boykin Nearing MD

## 2017-05-23 MED FILL — CARVEDILOL 6.25 MG TABLET: 6.25 | 30 days supply | Qty: 60 | Fill #0

## 2017-06-02 ENCOUNTER — Ambulatory Visit: Payer: Self-pay | Attending: Family Medicine | Admitting: Family Medicine

## 2017-06-02 ENCOUNTER — Encounter: Payer: Self-pay | Admitting: Family Medicine

## 2017-06-02 VITALS — BP 107/70 | HR 70 | Temp 97.6°F | Ht 71.0 in | Wt 173.4 lb

## 2017-06-02 DIAGNOSIS — Z7982 Long term (current) use of aspirin: Secondary | ICD-10-CM | POA: Insufficient documentation

## 2017-06-02 DIAGNOSIS — B1920 Unspecified viral hepatitis C without hepatic coma: Secondary | ICD-10-CM | POA: Insufficient documentation

## 2017-06-02 DIAGNOSIS — R229 Localized swelling, mass and lump, unspecified: Secondary | ICD-10-CM | POA: Insufficient documentation

## 2017-06-02 DIAGNOSIS — Z79899 Other long term (current) drug therapy: Secondary | ICD-10-CM | POA: Insufficient documentation

## 2017-06-02 DIAGNOSIS — K7460 Unspecified cirrhosis of liver: Secondary | ICD-10-CM | POA: Insufficient documentation

## 2017-06-02 DIAGNOSIS — I10 Essential (primary) hypertension: Secondary | ICD-10-CM | POA: Insufficient documentation

## 2017-06-02 DIAGNOSIS — L821 Other seborrheic keratosis: Secondary | ICD-10-CM | POA: Insufficient documentation

## 2017-06-02 DIAGNOSIS — F17210 Nicotine dependence, cigarettes, uncomplicated: Secondary | ICD-10-CM | POA: Insufficient documentation

## 2017-06-02 DIAGNOSIS — N4889 Other specified disorders of penis: Secondary | ICD-10-CM | POA: Insufficient documentation

## 2017-06-02 DIAGNOSIS — Z23 Encounter for immunization: Secondary | ICD-10-CM

## 2017-06-02 MED ORDER — ZOSTER VAC RECOMB ADJUVANTED 50 MCG/0.5ML IM SUSR: 0.5000 mL | Freq: Once | INTRAMUSCULAR | 1 refills | Status: AC

## 2017-06-02 NOTE — Assessment & Plan Note (Signed)
Cryotherapy round done today

## 2017-06-02 NOTE — Progress Notes (Signed)
Subjective:  Patient ID: James Avila, male    DOB: Dec 27, 1950  Age: 66 y.o. MRN: [id number]  CC: Skin Problem and Penis Pain   HPI James Avila has HTN, cirrhosis, Hep C, cirrhosis he presents for    1.  Seborrheic keratoes: painless. On forehead and supraclavicular area. No bleeding or itching.  He tolerated the last cryotherapy well. He request additional cryotherapy.   2. Penis lesion: for several decades. He reports papule on dorsal shaft of penis. He reports intermittent uswelling with pain. He squeezes it and discharge comes out.   Social History  Substance Use Topics  . Smoking status: Current Every Day Smoker    Packs/day: 0.50    Types: Cigarettes  . Smokeless tobacco: Never Used     Comment: Smoking 5-10 cigs/day  . Alcohol use 2.4 oz/week    4 Cans of beer per week     Comment: weekly    Outpatient Medications Prior to Visit  Medication Sig Dispense Refill  . aspirin 81 MG tablet Take 1 tablet (81 mg total) by mouth daily. 90 tablet 3  . carvedilol (COREG) 6.25 MG tablet Take 1 tablet (6.25 mg total) by mouth 2 (two) times daily with a meal. 180 tablet 3  . lisinopril-hydrochlorothiazide (ZESTORETIC) 20-12.5 MG tablet Take 2 tablets by mouth daily. 180 tablet 3  . naproxen (NAPROSYN) 500 MG tablet TAKE 1 TABLET BY MOUTH 2 TIMES DAILY WITH A MEAL 30 tablet 0  . sodium chloride (OCEAN) 0.65 % SOLN nasal spray Place 1 spray into both nostrils as needed for congestion. 1 Bottle 0  . tamsulosin (FLOMAX) 0.4 MG CAPS capsule Take 2 capsules (0.8 mg total) by mouth daily after supper. 60 capsule 2  . zolpidem (AMBIEN) 10 MG tablet Take 1 tablet (10 mg total) by mouth at bedtime as needed for sleep. 90 tablet 1   No facility-administered medications prior to visit.     ROS Review of Systems  Constitutional: Negative for chills, fatigue, fever and unexpected weight change.  Eyes: Negative for visual disturbance.  Respiratory: Negative for cough and shortness of  breath.   Cardiovascular: Negative for chest pain, palpitations and leg swelling.  Gastrointestinal: Negative for abdominal pain, blood in stool, constipation, diarrhea, nausea and vomiting.  Endocrine: Negative for polydipsia, polyphagia and polyuria.  Genitourinary: Positive for penile pain.  Musculoskeletal: Negative for arthralgias, back pain, gait problem, myalgias and neck pain.  Skin: Negative for rash.  Allergic/Immunologic: Negative for immunocompromised state.  Hematological: Negative for adenopathy. Does not bruise/bleed easily.  Psychiatric/Behavioral: Negative for dysphoric mood, sleep disturbance and suicidal ideas. The patient is not nervous/anxious.     Objective:  BP 107/70   Pulse 70   Temp 97.6 F (36.4 C) (Oral)   Ht 5\' 11"  (1.803 m)   Wt 173 lb 6.4 oz (78.7 kg)   SpO2 96%   BMI 24.18 kg/m   BP/Weight 06/02/2017 06/29/5052 08/04/6733  Systolic BP 193 790 240  Diastolic BP 70 74 71  Wt. (Lbs) 173.4 182 173  BMI 24.18 25.38 24.13   Physical Exam  Constitutional: He appears well-developed and well-nourished. No distress.  HENT:  Head: Normocephalic and atraumatic.    Neck: Normal range of motion. Neck supple.  Cardiovascular: Normal rate, regular rhythm, normal heart sounds and intact distal pulses.   Pulmonary/Chest: Effort normal and breath sounds normal.  Genitourinary:     Musculoskeletal: He exhibits no edema.  Neurological: He is alert.  Skin: Skin is warm  and dry. No rash noted. No erythema.     Psychiatric: He has a normal mood and affect.    Treated scalp and supraclavicular seborrheic keratoses with cryotherapy x 3 freeze-thaw cycles  Assessment & Plan:  Bell was seen today for skin problem and penis pain.  Diagnoses and all orders for this visit:  Need for shingles vaccine -     Zoster Vac Recomb Adjuvanted [facility]) injection; Inject 0.5 mLs into the muscle once. Repeat in 2-6 months  Nodule of shaft of penis -     Ambulatory  referral to Urology  Seborrheic keratoses    Follow-up: Return in about 4 weeks (around 06/30/2017) for cryotherapy.   Boykin Nearing MD

## 2017-06-02 NOTE — Patient Instructions (Addendum)
Torren was seen today for insomnia.  Diagnoses and all orders for this visit:  Need for shingles vaccine -     Zoster Vac Recomb Adjuvanted [facility]) injection; Inject 0.5 mLs into the muscle once. Repeat in 2-6 months  Nodule of shaft of penis -     Ambulatory referral to Urology  Seborrheic keratoses   Treated with cryotherapy today   F/u in 4 weeks for repeat cryotherapy   Dr. Adrian Blackwater   Starting on July 17, 2017 I will be seeing patient at [facility]. You are welcome to follow up with me there if you like. [facility] accepts insurance and self pay.   [facility] at [facility]  [address] [place], [place] [address]  Ph: [phone] Fax: [phone]

## 2017-06-02 NOTE — Assessment & Plan Note (Signed)
Cystic nodule Urology referral placed

## 2017-06-26 MED FILL — CARVEDILOL 6.25 MG TABLET: 6.25 | 90 days supply | Qty: 180 | Fill #1

## 2017-07-03 ENCOUNTER — Encounter: Payer: Self-pay | Admitting: Family Medicine

## 2017-07-03 ENCOUNTER — Ambulatory Visit: Payer: Self-pay | Attending: Family Medicine | Admitting: Family Medicine

## 2017-07-03 VITALS — BP 113/72 | HR 72 | Temp 97.6°F | Ht 71.0 in | Wt 177.0 lb

## 2017-07-03 DIAGNOSIS — K7460 Unspecified cirrhosis of liver: Secondary | ICD-10-CM | POA: Insufficient documentation

## 2017-07-03 DIAGNOSIS — M778 Other enthesopathies, not elsewhere classified: Secondary | ICD-10-CM | POA: Insufficient documentation

## 2017-07-03 DIAGNOSIS — N401 Enlarged prostate with lower urinary tract symptoms: Secondary | ICD-10-CM | POA: Insufficient documentation

## 2017-07-03 DIAGNOSIS — Z1211 Encounter for screening for malignant neoplasm of colon: Secondary | ICD-10-CM

## 2017-07-03 DIAGNOSIS — Z7982 Long term (current) use of aspirin: Secondary | ICD-10-CM | POA: Insufficient documentation

## 2017-07-03 DIAGNOSIS — M7580 Other shoulder lesions, unspecified shoulder: Secondary | ICD-10-CM

## 2017-07-03 DIAGNOSIS — L821 Other seborrheic keratosis: Secondary | ICD-10-CM

## 2017-07-03 DIAGNOSIS — R195 Other fecal abnormalities: Secondary | ICD-10-CM | POA: Insufficient documentation

## 2017-07-03 DIAGNOSIS — I10 Essential (primary) hypertension: Secondary | ICD-10-CM

## 2017-07-03 DIAGNOSIS — B1920 Unspecified viral hepatitis C without hepatic coma: Secondary | ICD-10-CM | POA: Insufficient documentation

## 2017-07-03 DIAGNOSIS — G4700 Insomnia, unspecified: Secondary | ICD-10-CM

## 2017-07-03 DIAGNOSIS — N3943 Post-void dribbling: Secondary | ICD-10-CM

## 2017-07-03 DIAGNOSIS — F17210 Nicotine dependence, cigarettes, uncomplicated: Secondary | ICD-10-CM | POA: Insufficient documentation

## 2017-07-03 MED ORDER — CARVEDILOL 6.25 MG PO TABS: 6.2500 mg | ORAL_TABLET | Freq: Two times a day (BID) | ORAL | 3 refills | Status: DC

## 2017-07-03 MED ORDER — ASPIRIN 81 MG PO TABS: 81.0000 mg | ORAL_TABLET | Freq: Every day | ORAL | 3 refills | Status: AC

## 2017-07-03 MED ORDER — LISINOPRIL-HYDROCHLOROTHIAZIDE 20-12.5 MG PO TABS: 2.0000 | ORAL_TABLET | Freq: Every day | ORAL | 3 refills | Status: DC

## 2017-07-03 MED ORDER — NAPROXEN 500 MG PO TABS: 500.0000 mg | ORAL_TABLET | Freq: Two times a day (BID) | ORAL | 2 refills | Status: DC | PRN

## 2017-07-03 MED ORDER — TAMSULOSIN HCL 0.4 MG PO CAPS: 0.8000 mg | ORAL_CAPSULE | Freq: Every day | ORAL | 3 refills | Status: DC

## 2017-07-03 MED ORDER — ZOLPIDEM TARTRATE 10 MG PO TABS: 10.0000 mg | ORAL_TABLET | Freq: Every evening | ORAL | 1 refills | Status: DC | PRN

## 2017-07-03 MED FILL — NAPROXEN 500 MG TABLET: 500 | 30 days supply | Qty: 60 | Fill #0

## 2017-07-03 MED FILL — ?CARVEDILOL 6.25 MG TABLET: 6.25 | 30 days supply | Qty: 60 | Fill #0

## 2017-07-03 MED FILL — TAMSULOSIN HCL 0.4 MG CAP: 0.4 | 90 days supply | Qty: 180 | Fill #0

## 2017-07-03 MED FILL — LISINOPRIL-HCTZ 20-12.5 MG: 20-12.5 | 30 days supply | Qty: 60 | Fill #0

## 2017-07-03 NOTE — Patient Instructions (Addendum)
James Avila was seen today for hypertension.  Diagnoses and all orders for this visit:  Essential hypertension -     carvedilol (COREG) 6.25 MG tablet; Take 1 tablet (6.25 mg total) by mouth 2 (two) times daily with a meal. -     lisinopril-hydrochlorothiazide (ZESTORETIC) 20-12.5 MG tablet; Take 2 tablets by mouth daily. -     aspirin 81 MG tablet; Take 1 tablet (81 mg total) by mouth daily.  Benign prostatic hyperplasia with post-void dribbling -     tamsulosin (FLOMAX) 0.4 MG CAPS capsule; Take 2 capsules (0.8 mg total) by mouth daily after supper.  Insomnia, unspecified type -     zolpidem (AMBIEN) 10 MG tablet; Take 1 tablet (10 mg total) by mouth at bedtime as needed for sleep.  Deltoid tendonitis, unspecified laterality -     naproxen (NAPROSYN) 500 MG tablet; Take 1 tablet (500 mg total) by mouth 2 (two) times daily as needed.  Colon cancer screening -     Fecal occult blood, imunochemical  For free colon cancer screening: Please complete home stool specimen collection and mail the kit. This is free screening.  You will receive at $10 gift card once the results are obtained.   F/u in 3 months for HTN   Dr. Adrian Blackwater

## 2017-07-03 NOTE — Progress Notes (Signed)
Subjective:  Patient ID: James Avila, male    DOB: 1951-01-26  Age: 66 y.o. MRN: [id number]  CC: Hypertension   HPI James Avila has HTN, cirrhosis, Hep C, cirrhosis he presents for    1.  Seborrheic keratoes: painless. On forehead and supraclavicular area. No bleeding or itching.  He tolerated the last cryotherapy well. He request additional cryotherapy.   2. HTN: he is compliant with his regimen. He denies HA, CP, SOB.   3. Healthcare maintenance: he is amenable to colon cancer screening. He likes the idea of a non invasive test. He declines  flu shot.   Social History  Substance Use Topics  . Smoking status: Current Every Day Smoker    Packs/day: 0.50    Types: Cigarettes  . Smokeless tobacco: Never Used     Comment: Smoking 5-10 cigs/day  . Alcohol use 2.4 oz/week    4 Cans of beer per week     Comment: weekly    Outpatient Medications Prior to Visit  Medication Sig Dispense Refill  . aspirin 81 MG tablet Take 1 tablet (81 mg total) by mouth daily. 90 tablet 3  . carvedilol (COREG) 6.25 MG tablet Take 1 tablet (6.25 mg total) by mouth 2 (two) times daily with a meal. 180 tablet 3  . lisinopril-hydrochlorothiazide (ZESTORETIC) 20-12.5 MG tablet Take 2 tablets by mouth daily. 180 tablet 3  . naproxen (NAPROSYN) 500 MG tablet TAKE 1 TABLET BY MOUTH 2 TIMES DAILY WITH A MEAL 30 tablet 0  . sodium chloride (OCEAN) 0.65 % SOLN nasal spray Place 1 spray into both nostrils as needed for congestion. 1 Bottle 0  . tamsulosin (FLOMAX) 0.4 MG CAPS capsule Take 2 capsules (0.8 mg total) by mouth daily after supper. 60 capsule 2  . zolpidem (AMBIEN) 10 MG tablet Take 1 tablet (10 mg total) by mouth at bedtime as needed for sleep. 90 tablet 1   No facility-administered medications prior to visit.     ROS Review of Systems  Constitutional: Negative for chills, fatigue, fever and unexpected weight change.  Eyes: Negative for visual disturbance.  Respiratory: Negative for cough  and shortness of breath.   Cardiovascular: Negative for chest pain, palpitations and leg swelling.  Gastrointestinal: Negative for abdominal pain, blood in stool, constipation, diarrhea, nausea and vomiting.  Endocrine: Negative for polydipsia, polyphagia and polyuria.  Musculoskeletal: Negative for arthralgias, back pain, gait problem, myalgias and neck pain.  Skin: Negative for rash.  Allergic/Immunologic: Negative for immunocompromised state.  Hematological: Negative for adenopathy. Does not bruise/bleed easily.  Psychiatric/Behavioral: Negative for dysphoric mood, sleep disturbance and suicidal ideas. The patient is not nervous/anxious.     Objective:  BP 113/72   Pulse 72   Temp 97.6 F (36.4 C) (Oral)   Ht 5\' 11"  (1.803 m)   Wt 177 lb (80.3 kg)   SpO2 97%   BMI 24.69 kg/m   BP/Weight 07/03/2017 01/28/8249 03/30/9766  Systolic BP 341 937 902  Diastolic BP 72 70 74  Wt. (Lbs) 177 173.4 182  BMI 24.69 24.18 25.38   Physical Exam  Constitutional: He appears well-developed and well-nourished. No distress.  HENT:  Head: Normocephalic and atraumatic.    Neck: Normal range of motion. Neck supple.  Cardiovascular: Normal rate, regular rhythm, normal heart sounds and intact distal pulses.   Pulmonary/Chest: Effort normal and breath sounds normal.  Musculoskeletal: He exhibits no edema.  Neurological: He is alert.  Skin: Skin is warm and dry. No rash noted.  No erythema.  Psychiatric: He has a normal mood and affect.    Treated scalp and supraclavicular seborrheic keratoses with cryotherapy x 3 freeze-thaw cycles  Assessment & Plan:  Koy was seen today for hypertension.  Diagnoses and all orders for this visit:  Essential hypertension -     carvedilol (COREG) 6.25 MG tablet; Take 1 tablet (6.25 mg total) by mouth 2 (two) times daily with a meal. -     lisinopril-hydrochlorothiazide (ZESTORETIC) 20-12.5 MG tablet; Take 2 tablets by mouth daily. -     aspirin 81 MG tablet; Take  1 tablet (81 mg total) by mouth daily.  Benign prostatic hyperplasia with post-void dribbling -     tamsulosin (FLOMAX) 0.4 MG CAPS capsule; Take 2 capsules (0.8 mg total) by mouth daily after supper.  Insomnia, unspecified type -     zolpidem (AMBIEN) 10 MG tablet; Take 1 tablet (10 mg total) by mouth at bedtime as needed for sleep.  Deltoid tendonitis, unspecified laterality -     naproxen (NAPROSYN) 500 MG tablet; Take 1 tablet (500 mg total) by mouth 2 (two) times daily as needed.  Colon cancer screening -     Fecal occult blood, imunochemical    Follow-up: Return in about 3 months (around 10/03/2017) for HTN.   Boykin Nearing MD

## 2017-07-03 NOTE — Assessment & Plan Note (Signed)
Well controlled Refilled coreg, lisinopril-HCTZ and aspirin

## 2017-07-03 NOTE — Assessment & Plan Note (Signed)
cryotherapy done today

## 2017-07-05 ENCOUNTER — Ambulatory Visit: Payer: Self-pay | Attending: Internal Medicine

## 2017-08-07 MED FILL — LISINOPRIL-HCTZ 20-12.5 MG: 20-12.5 | 90 days supply | Qty: 180 | Fill #1

## 2017-08-07 MED FILL — ?CARVEDILOL 6.25 MG TABLET: 6.25 | 90 days supply | Qty: 180 | Fill #1

## 2017-10-04 MED FILL — TAMSULOSIN HCL 0.4 MG CAP: 0.4 | 90 days supply | Qty: 180 | Fill #1

## 2017-11-13 MED FILL — CARVEDILOL 6.25 MG TABLET: 6.25 | 90 days supply | Qty: 180 | Fill #2

## 2017-11-13 MED FILL — LISINOPRIL-HCTZ 20-12.5 MG: 20-12.5 | 90 days supply | Qty: 180 | Fill #2

## 2018-02-12 MED FILL — LISINOPRIL-HCTZ 20-12.5 MG: 20-12.5 | 90 days supply | Qty: 180 | Fill #3

## 2018-02-12 MED FILL — TAMSULOSIN HCL 0.4 MG CAP: 0.4 | 90 days supply | Qty: 180 | Fill #2

## 2018-02-12 MED FILL — CARVEDILOL 6.25 MG TABLET: 6.25 | 90 days supply | Qty: 180 | Fill #3

## 2018-05-09 MED FILL — CARVEDILOL 6.25 MG TABLET: 6.25 | 60 days supply | Qty: 120 | Fill #4

## 2018-05-09 MED FILL — TAMSULOSIN HCL 0.4 MG CAP: 0.4 | 90 days supply | Qty: 180 | Fill #3

## 2018-05-09 MED FILL — LISINOPRIL-HCTZ 20-12.5 MG: 20-12.5 | 60 days supply | Qty: 120 | Fill #4

## 2018-05-10 ENCOUNTER — Ambulatory Visit: Payer: Medicare HMO | Attending: Family Medicine | Admitting: Physician Assistant

## 2018-05-10 VITALS — BP 165/80 | HR 98 | Temp 98.2°F | Resp 18 | Ht 70.0 in | Wt 168.0 lb

## 2018-05-10 DIAGNOSIS — M778 Other enthesopathies, not elsewhere classified: Secondary | ICD-10-CM | POA: Insufficient documentation

## 2018-05-10 DIAGNOSIS — I10 Essential (primary) hypertension: Secondary | ICD-10-CM

## 2018-05-10 DIAGNOSIS — Z7982 Long term (current) use of aspirin: Secondary | ICD-10-CM | POA: Diagnosis not present

## 2018-05-10 DIAGNOSIS — S20211A Contusion of right front wall of thorax, initial encounter: Secondary | ICD-10-CM

## 2018-05-10 DIAGNOSIS — N3943 Post-void dribbling: Secondary | ICD-10-CM | POA: Diagnosis not present

## 2018-05-10 DIAGNOSIS — N401 Enlarged prostate with lower urinary tract symptoms: Secondary | ICD-10-CM | POA: Diagnosis not present

## 2018-05-10 DIAGNOSIS — M7580 Other shoulder lesions, unspecified shoulder: Secondary | ICD-10-CM | POA: Diagnosis not present

## 2018-05-10 DIAGNOSIS — Z791 Long term (current) use of non-steroidal anti-inflammatories (NSAID): Secondary | ICD-10-CM | POA: Diagnosis not present

## 2018-05-10 DIAGNOSIS — Z8619 Personal history of other infectious and parasitic diseases: Secondary | ICD-10-CM | POA: Diagnosis not present

## 2018-05-10 DIAGNOSIS — G4700 Insomnia, unspecified: Secondary | ICD-10-CM

## 2018-05-10 DIAGNOSIS — Z79899 Other long term (current) drug therapy: Secondary | ICD-10-CM | POA: Diagnosis not present

## 2018-05-10 DIAGNOSIS — W010XXA Fall on same level from slipping, tripping and stumbling without subsequent striking against object, initial encounter: Secondary | ICD-10-CM | POA: Diagnosis not present

## 2018-05-10 DIAGNOSIS — Z888 Allergy status to other drugs, medicaments and biological substances status: Secondary | ICD-10-CM | POA: Diagnosis not present

## 2018-05-10 DIAGNOSIS — E559 Vitamin D deficiency, unspecified: Secondary | ICD-10-CM | POA: Diagnosis not present

## 2018-05-10 MED ORDER — TAMSULOSIN HCL 0.4 MG PO CAPS: 0.8000 mg | ORAL_CAPSULE | Freq: Every day | ORAL | 3 refills | Status: DC

## 2018-05-10 MED ORDER — CARVEDILOL 6.25 MG PO TABS: 6.2500 mg | ORAL_TABLET | Freq: Two times a day (BID) | ORAL | 3 refills | Status: DC

## 2018-05-10 MED ORDER — LISINOPRIL-HYDROCHLOROTHIAZIDE 20-12.5 MG PO TABS: 2.0000 | ORAL_TABLET | Freq: Every day | ORAL | 3 refills | Status: DC

## 2018-05-10 MED ORDER — METHOCARBAMOL 500 MG PO TABS: 500.0000 mg | ORAL_TABLET | Freq: Three times a day (TID) | ORAL | 0 refills | Status: DC | PRN

## 2018-05-10 MED ORDER — NAPROXEN 500 MG PO TABS: 500.0000 mg | ORAL_TABLET | Freq: Two times a day (BID) | ORAL | 2 refills | Status: DC | PRN

## 2018-05-10 MED ORDER — ZOLPIDEM TARTRATE 10 MG PO TABS: 10.0000 mg | ORAL_TABLET | Freq: Every evening | ORAL | 0 refills | Status: DC | PRN

## 2018-05-10 MED FILL — NAPROXEN 500 MG TABLET: 500 | 90 days supply | Qty: 180 | Fill #0

## 2018-05-10 MED FILL — METHOCARBAMOL 500 MG TABS: 500 | 30 days supply | Qty: 90 | Fill #0

## 2018-05-10 NOTE — Progress Notes (Signed)
Patient ID: James Avila, male   DOB: [birth year], 67 y.o.   MRN: [id number]   James Avila, is a 67 y.o. male  XTK:[id number]  DJM:[id number]  DOB - [birth year]  Subjective:  Chief Complaint and HPI: James Avila is a 67 y.o. male here today for med RF and rib contusion.  About a week ago, he slipped in the rain and landed on his R ribs.  He doesn't want to do xrays.  It has been a long time since he had an office visit.  He also wants ambien RF.  Hasn't had bloodwork in a while.  No SOB.  Didn't take BP meds today.  Has been out a few days.  Needs RF. Aleve with minimal relief.  ROS:   Constitutional:  No f/c, No night sweats, No unexplained weight loss. EENT:  No vision changes, No blurry vision, No hearing changes. No mouth, throat, or ear problems.  Respiratory: No cough, No SOB Cardiac: No CP, no palpitations GI:  No abd pain, No N/V/D. GU: No Urinary s/sx Musculoskeletal: +R rib pain Neuro: No headache, no dizziness, no motor weakness.  Skin: No rash Endocrine:  No polydipsia. No polyuria.  Psych: Denies SI/HI  No problems updated.  ALLERGIES: Allergies  Allergen Reactions  . Hydralazine     Causes numbness Increased blood pressure    PAST MEDICAL HISTORY: Past Medical History:  Diagnosis Date  . Anxiety state, unspecified 10/10/2013  . BPH (benign prostatic hyperplasia) 10/10/2013  . HEPATITIS C 05/10/2007   Qualifier: Diagnosis of  By: Leanne Chang MD, Bruce    . Hypertension   . TOBACCO USE 09/07/2007   Qualifier: Diagnosis of  By: Leanne Chang MD, Bruce      MEDICATIONS AT HOME: Prior to Admission medications   Medication Sig Start Date End Date Taking? Authorizing Provider  aspirin 81 MG tablet Take 1 tablet (81 mg total) by mouth daily. 07/03/17  Yes Funches, Adriana Mccallum, MD  carvedilol (COREG) 6.25 MG tablet Take 1 tablet (6.25 mg total) by mouth 2 (two) times daily with a meal. 05/10/18  Yes McClung, Angela M, PA-C  lisinopril-hydrochlorothiazide (ZESTORETIC) 20-12.5 MG  tablet Take 2 tablets by mouth daily. 05/10/18  Yes McClung, Dionne Bucy, PA-C  naproxen (NAPROSYN) 500 MG tablet Take 1 tablet (500 mg total) by mouth 2 (two) times daily as needed. 05/10/18  Yes Argentina Donovan, PA-C  tamsulosin (FLOMAX) 0.4 MG CAPS capsule Take 2 capsules (0.8 mg total) by mouth daily after supper. 05/10/18  Yes McClung, Dionne Bucy, PA-C  methocarbamol (ROBAXIN) 500 MG tablet Take 1 tablet (500 mg total) by mouth every 8 (eight) hours as needed for muscle spasms. 05/10/18   Argentina Donovan, PA-C  zolpidem (AMBIEN) 10 MG tablet Take 1 tablet (10 mg total) by mouth at bedtime as needed for sleep. 05/10/18 06/09/18  Argentina Donovan, PA-C     Objective:  EXAM:   Vitals:   05/10/18 1120  BP: (!) 165/80  Pulse: 98  Resp: 18  Temp: 98.2 F (36.8 C)  TempSrc: Oral  SpO2: 100%  Weight: 168 lb (76.2 kg)  Height: 5\' 10"  (1.778 m)    General appearance : A&OX3. NAD. Non-toxic-appearing HEENT: Atraumatic and Normocephalic.  PERRLA. EOM intact.   Neck: supple, no JVD. No cervical lymphadenopathy. No thyromegaly Chest/Lungs:  Breathing-non-labored, Good air entry bilaterally, breath sounds normal without rales, rhonchi, or wheezing.  No visible bruising on chest or back.  Some general TTP over lower R ribs but no point  TTP CVS: S1 S2 regular, no murmurs, gallops, rubs  Abdomen: Bowel sounds present, Non tender and not distended with no gaurding, rigidity or rebound. Extremities: Bilateral Lower Ext shows no edema, both legs are warm to touch with = pulse throughout Neurology:  CN II-XII grossly intact, Non focal.   Psych:  TP linear. J/I WNL. Normal speech. Appropriate eye contact and affect.  Skin:  No Rash  Data Review Lab Results  Component Value Date   HGBA1C 5.4 10/10/2013     Assessment & Plan   1. Contusion of rib on right side, initial encounter No red flags.  Deep breathing exercises recommended - naproxen (NAPROSYN) 500 MG tablet; Take 1 tablet (500 mg total)  by mouth 2 (two) times daily as needed.  Dispense: 60 tablet; Refill: 2 - CBC with Differential/Platelet - methocarbamol (ROBAXIN) 500 MG tablet; Take 1 tablet (500 mg total) by mouth every 8 (eight) hours as needed for muscle spasms.  Dispense: 90 tablet; Refill: 0  2. Insomnia, unspecified type Will nee to address with PCP if this is ok for daily/ongoing use.  Use sparingly.  - zolpidem (AMBIEN) 10 MG tablet; Take 1 tablet (10 mg total) by mouth at bedtime as needed for sleep.  Dispense: 15 tablet; Refill: 0  3. Benign prostatic hyperplasia with post-void dribbling rf meds - tamsulosin (FLOMAX) 0.4 MG CAPS capsule; Take 2 capsules (0.8 mg total) by mouth daily after supper.  Dispense: 180 capsule; Refill: 3  4. Deltoid tendonitis, unspecified laterality Stable/unchanged  5. Essential hypertension Not controlled but no meds last few days.  Check bp ooo after resuming meds - lisinopril-hydrochlorothiazide (ZESTORETIC) 20-12.5 MG tablet; Take 2 tablets by mouth daily.  Dispense: 180 tablet; Refill: 3 - carvedilol (COREG) 6.25 MG tablet; Take 1 tablet (6.25 mg total) by mouth 2 (two) times daily with a meal.  Dispense: 180 tablet; Refill: 3 - Comprehensive metabolic panel - CBC with Differential/Platelet  6. Vitamin D deficiency - Vitamin D, 25-hydroxy  Patient have been counseled extensively about nutrition and exercise  Return in about 6 months (around 11/09/2018) for Dr Wynetta Emery for BP and other issues.  The patient was given clear instructions to go to ER or return to medical center if symptoms don't improve, worsen or new problems develop. The patient verbalized understanding. The patient was told to call to get lab results if they haven't heard anything in the next week.     Freeman Caldron, PA-C [facility] and [facility] [place], [place]   05/10/2018, 11:41 AM

## 2018-05-11 ENCOUNTER — Other Ambulatory Visit: Payer: Self-pay | Admitting: Physician Assistant

## 2018-05-11 LAB — COMPREHENSIVE METABOLIC PANEL
A/G RATIO: 1.6 (ref 1.2–2.2)
ALBUMIN: 4.3 g/dL (ref 3.6–4.8)
ALT: 10 IU/L (ref 0–44)
AST: 13 IU/L (ref 0–40)
Alkaline Phosphatase: 60 IU/L (ref 39–117)
BUN / CREAT RATIO: 17 (ref 10–24)
BUN: 17 mg/dL (ref 8–27)
Bilirubin Total: 0.6 mg/dL (ref 0.0–1.2)
CALCIUM: 9.2 mg/dL (ref 8.6–10.2)
CO2: 26 mmol/L (ref 20–29)
CREATININE: 1 mg/dL (ref 0.76–1.27)
Chloride: 99 mmol/L (ref 96–106)
GFR, EST AFRICAN AMERICAN: 91 mL/min/{1.73_m2} (ref >59)
GFR, EST NON AFRICAN AMERICAN: 79 mL/min/{1.73_m2} (ref >59)
GLOBULIN, TOTAL: 2.7 g/dL (ref 1.5–4.5)
Glucose: 113 mg/dL — ABNORMAL HIGH (ref 65–99)
POTASSIUM: 3.6 mmol/L (ref 3.5–5.2)
SODIUM: 140 mmol/L (ref 134–144)
TOTAL PROTEIN: 7 g/dL (ref 6.0–8.5)

## 2018-05-11 LAB — CBC WITH DIFFERENTIAL/PLATELET
BASOS: 1 %
Basophils Absolute: 0 10*3/uL (ref 0.0–0.2)
EOS (ABSOLUTE): 0.4 10*3/uL (ref 0.0–0.4)
EOS: 6 %
HEMATOCRIT: 37.7 % (ref 37.5–51.0)
Hemoglobin: 13.2 g/dL (ref 13.0–17.7)
Immature Grans (Abs): 0 10*3/uL (ref 0.0–0.1)
Immature Granulocytes: 0 %
LYMPHS ABS: 3.1 10*3/uL (ref 0.7–3.1)
Lymphs: 41 %
MCH: 30.6 pg (ref 26.6–33.0)
MCHC: 35 g/dL (ref 31.5–35.7)
MCV: 88 fL (ref 79–97)
MONOCYTES: 8 %
Monocytes Absolute: 0.6 10*3/uL (ref 0.1–0.9)
Neutrophils Absolute: 3.4 10*3/uL (ref 1.4–7.0)
Neutrophils: 44 %
Platelets: 227 10*3/uL (ref 150–450)
RBC: 4.31 x10E6/uL (ref 4.14–5.80)
RDW: 13.1 % (ref 12.3–15.4)
WBC: 7.6 10*3/uL (ref 3.4–10.8)

## 2018-05-11 LAB — VITAMIN D 25 HYDROXY (VIT D DEFICIENCY, FRACTURES): Vit D, 25-Hydroxy: 17.1 ng/mL — ABNORMAL LOW (ref 30.0–100.0)

## 2018-05-11 MED ORDER — VITAMIN D (ERGOCALCIFEROL) 1.25 MG (50000 UNIT) PO CAPS: 50000.0000 [IU] | ORAL_CAPSULE | ORAL | 0 refills | Status: DC

## 2018-05-11 MED FILL — LISINOPRIL-HCTZ 20-12.5 MG: 20-12.5 | 90 days supply | Qty: 180 | Fill #0

## 2018-05-14 ENCOUNTER — Telehealth: Payer: Self-pay | Admitting: *Deleted

## 2018-05-14 MED FILL — VIT D2 1.25 MG (50,000 UNIT: 1.25 MG | 28 days supply | Qty: 4 | Fill #0

## 2018-05-14 NOTE — Telephone Encounter (Signed)
-----   Message from Argentina Donovan, [place] sent at 05/11/2018  7:16 PM EDT ----- Your vitamin D is low.  This can contribute to muscle aches, anxiety, fatigue, and depression.  I have sent a prescription to our pharmacy for you to take once a week.  We will recheck this level in 3-4 months.  Your other labs look good. Thanks, Freeman Caldron, PA-C

## 2018-05-14 NOTE — Telephone Encounter (Signed)
Medical Assistant left message on patient's home and cell voicemail. Voicemail states to give a call back to [place] with [facility] at [phone]. !!!Please inform patient of vitamin d level being low and this contributing to muscle aches and fatigue. Patient should pick up prescription from pharmacy and take once a week with a recheck being completed in 3-4 months!!!

## 2018-08-15 MED FILL — VIT D2 1.25 MG (50,000 UNIT: 1.25 MG | 84 days supply | Qty: 12 | Fill #1

## 2018-08-15 MED FILL — LISINOPRIL-HCTZ 20-12.5 MG: 20-12.5 | 90 days supply | Qty: 180 | Fill #1

## 2018-08-15 MED FILL — TAMSULOSIN HCL 0.4 MG CAP: 0.4 | 90 days supply | Qty: 180 | Fill #0

## 2018-08-15 MED FILL — CARVEDILOL 6.25 MG TABLET: 6.25 | 90 days supply | Qty: 180 | Fill #0

## 2018-10-29 MED FILL — CARVEDILOL 6.25 MG TABLET: 6.25 | 90 days supply | Qty: 180 | Fill #1

## 2018-10-29 MED FILL — LISINOPRIL-HCTZ 20-12.5 MG: 20-12.5 | 90 days supply | Qty: 180 | Fill #2

## 2018-10-29 MED FILL — TAMSULOSIN HCL 0.4 MG CAP: 0.4 | 90 days supply | Qty: 180 | Fill #1

## 2018-11-06 MED FILL — LISINOPRIL-HCTZ 20-12.5 MG: 20-12.5 | 90 days supply | Qty: 180 | Fill #2

## 2018-11-06 MED FILL — CARVEDILOL 6.25 MG TABLET: 6.25 | 90 days supply | Qty: 180 | Fill #1

## 2018-11-06 MED FILL — TAMSULOSIN HCL 0.4 MG CAP: 0.4 | 90 days supply | Qty: 180 | Fill #1

## 2019-02-05 ENCOUNTER — Encounter: Payer: Self-pay | Admitting: Internal Medicine

## 2019-02-05 ENCOUNTER — Ambulatory Visit: Payer: Medicare HMO | Attending: Internal Medicine | Admitting: Internal Medicine

## 2019-02-05 VITALS — BP 126/82 | HR 83 | Temp 98.4°F | Resp 16 | Ht 70.5 in | Wt 165.0 lb

## 2019-02-05 DIAGNOSIS — F5104 Psychophysiologic insomnia: Secondary | ICD-10-CM

## 2019-02-05 DIAGNOSIS — J988 Other specified respiratory disorders: Secondary | ICD-10-CM

## 2019-02-05 DIAGNOSIS — G8929 Other chronic pain: Secondary | ICD-10-CM

## 2019-02-05 DIAGNOSIS — M545 Low back pain, unspecified: Secondary | ICD-10-CM

## 2019-02-05 DIAGNOSIS — B9789 Other viral agents as the cause of diseases classified elsewhere: Secondary | ICD-10-CM | POA: Diagnosis not present

## 2019-02-05 DIAGNOSIS — Z8619 Personal history of other infectious and parasitic diseases: Secondary | ICD-10-CM | POA: Insufficient documentation

## 2019-02-05 DIAGNOSIS — K7460 Unspecified cirrhosis of liver: Secondary | ICD-10-CM

## 2019-02-05 DIAGNOSIS — I10 Essential (primary) hypertension: Secondary | ICD-10-CM

## 2019-02-05 DIAGNOSIS — F410 Panic disorder [episodic paroxysmal anxiety] without agoraphobia: Secondary | ICD-10-CM

## 2019-02-05 MED ORDER — ZOLPIDEM TARTRATE 10 MG PO TABS: 10.0000 mg | ORAL_TABLET | Freq: Every evening | ORAL | 0 refills | Status: DC | PRN

## 2019-02-05 MED ORDER — SERTRALINE HCL 50 MG PO TABS: 50.0000 mg | ORAL_TABLET | Freq: Every day | ORAL | 5 refills | Status: DC

## 2019-02-05 MED ORDER — ZOLPIDEM TARTRATE 10 MG PO TABS: 5.0000 mg | ORAL_TABLET | Freq: Every evening | ORAL | 0 refills | Status: DC | PRN

## 2019-02-05 NOTE — Patient Instructions (Signed)
Start Zoloft 50 mg half a tablet daily for the first 2 weeks.  After the first 2 weeks start taking a full tablet daily.  Use Ambien only as needed for insomnia. Insomnia Insomnia is a sleep disorder that makes it difficult to fall asleep or stay asleep. Insomnia can cause fatigue, low energy, difficulty concentrating, mood swings, and poor performance at work or school. There are three different ways to classify insomnia:  Difficulty falling asleep.  Difficulty staying asleep.  Waking up too early in the morning. Any type of insomnia can be long-term (chronic) or short-term (acute). Both are common. Short-term insomnia usually lasts for three months or less. Chronic insomnia occurs at least three times a week for longer than three months. What are the causes? Insomnia may be caused by another condition, situation, or substance, such as:  Anxiety.  Certain medicines.  Gastroesophageal reflux disease (GERD) or other gastrointestinal conditions.  Asthma or other breathing conditions.  Restless legs syndrome, sleep apnea, or other sleep disorders.  Chronic pain.  Menopause.  Stroke.  Abuse of alcohol, tobacco, or illegal drugs.  Mental health conditions, such as depression.  Caffeine.  Neurological disorders, such as Alzheimer's disease.  An overactive thyroid (hyperthyroidism). Sometimes, the cause of insomnia may not be known. What increases the risk? Risk factors for insomnia include:  Gender. Women are affected more often than men.  Age. Insomnia is more common as you get older.  Stress.  Lack of exercise.  Irregular work schedule or working night shifts.  Traveling between different time zones.  Certain medical and mental health conditions. What are the signs or symptoms? If you have insomnia, the main symptom is having trouble falling asleep or having trouble staying asleep. This may lead to other symptoms, such as:  Feeling fatigued or having low  energy.  Feeling nervous about going to sleep.  Not feeling rested in the morning.  Having trouble concentrating.  Feeling irritable, anxious, or depressed. How is this diagnosed? This condition may be diagnosed based on:  Your symptoms and medical history. Your health care provider may ask about: ? Your sleep habits. ? Any medical conditions you have. ? Your mental health.  A physical exam. How is this treated? Treatment for insomnia depends on the cause. Treatment may focus on treating an underlying condition that is causing insomnia. Treatment may also include:  Medicines to help you sleep.  Counseling or therapy.  Lifestyle adjustments to help you sleep better. Follow these instructions at home: Eating and drinking   Limit or avoid alcohol, caffeinated beverages, and cigarettes, especially close to bedtime. These can disrupt your sleep.  Do not eat a large meal or eat spicy foods right before bedtime. This can lead to digestive discomfort that can make it hard for you to sleep. Sleep habits   Keep a sleep diary to help you and your health care provider figure out what could be causing your insomnia. Write down: ? When you sleep. ? When you wake up during the night. ? How well you sleep. ? How rested you feel the next day. ? Any side effects of medicines you are taking. ? What you eat and drink.  Make your bedroom a dark, comfortable place where it is easy to fall asleep. ? Put up shades or blackout curtains to block light from outside. ? Use a white noise machine to block noise. ? Keep the temperature cool.  Limit screen use before bedtime. This includes: ? Watching TV. ? Using your  smartphone, tablet, or computer.  Stick to a routine that includes going to bed and waking up at the same times every day and night. This can help you fall asleep faster. Consider making a quiet activity, such as reading, part of your nighttime routine.  Try to avoid taking naps  during the day so that you sleep better at night.  Get out of bed if you are still awake after 15 minutes of trying to sleep. Keep the lights down, but try reading or doing a quiet activity. When you feel sleepy, go back to bed. General instructions  Take over-the-counter and prescription medicines only as told by your health care provider.  Exercise regularly, as told by your health care provider. Avoid exercise starting several hours before bedtime.  Use relaxation techniques to manage stress. Ask your health care provider to suggest some techniques that may work well for you. These may include: ? Breathing exercises. ? Routines to release muscle tension. ? Visualizing peaceful scenes.  Make sure that you drive carefully. Avoid driving if you feel very sleepy.  Keep all follow-up visits as told by your health care provider. This is important. Contact a health care provider if:  You are tired throughout the day.  You have trouble in your daily routine due to sleepiness.  You continue to have sleep problems, or your sleep problems get worse. Get help right away if:  You have serious thoughts about hurting yourself or someone else. If you ever feel like you may hurt yourself or others, or have thoughts about taking your own life, get help right away. You can go to your nearest emergency department or call:  Your local emergency services (911 in the [place]  A suicide crisis helpline, such as the [facility] at 9050799220. This is open 24 hours a day. Summary  Insomnia is a sleep disorder that makes it difficult to fall asleep or stay asleep.  Insomnia can be long-term (chronic) or short-term (acute).  Treatment for insomnia depends on the cause. Treatment may focus on treating an underlying condition that is causing insomnia.  Keep a sleep diary to help you and your health care provider figure out what could be causing your insomnia. This  information is not intended to replace advice given to you by your health care provider. Make sure you discuss any questions you have with your health care provider. Document Released: 11/11/2000 Document Revised: 08/24/2017 Document Reviewed: 08/24/2017 Elsevier Interactive Patient Education  2019 [employer]    Panic Attack  A panic attack is when you suddenly feel very afraid, uncomfortable, or nervous (anxious). A panic attack can happen when you are scared or for no reason. A panic attack can feel like a serious problem. It can even feel like a heart attack or stroke. See your doctor when you have a panic attack to make sure you do not have a serious problem. Follow these instructions at home:  Take medicines only as told by your doctor.  If you feel worried or nervous, try not to have caffeine.  Take good care of your health. To do this: ? Eat healthy. Make sure to eat fresh fruits and vegetables, whole grains, lean meats, and low-fat dairy. ? Get enough sleep. Try to sleep for 7-8 hours each night. ? Exercise. Try to be active for 30 minutes 5 or more days a week. ? Do not smoke. Talk to your doctor if you need help quitting. ? Limit how much alcohol you  drink:  If you are a woman who is not pregnant: try not to have more than 1 drink a day.  If you are a man: try not to have more than 2 drinks a day.  One drink equals 12 oz of beer, 5 oz of wine, or 1 oz of hard liquor.  Keep all follow-up visits as told by your doctor. This is important. Contact a doctor if:  Your symptoms do not get better.  Your symptoms get worse.  You are not able to take your medicines as told. Get help right away if:  You have thoughts of hurting yourself or others.  You have symptoms of a panic attack. Do not drive yourself to the hospital. Have someone else drive you or call an ambulance. If you feel like you may hurt yourself or others, or have thoughts about taking your own life, get  help right away. You can go to your nearest emergency department or call:  Your local emergency services (911 in the [place]  A suicide crisis helpline, such as the [place] [facility] at 778-287-0592. This is open 24 hours a day. Summary  A panic attack is when you suddenly feel very afraid, uncomfortable, or nervous (anxious).  See your doctor when you have a panic attack to make sure that you do not have another serious problem.  If you feel like you may hurt yourself or others, get help right away by calling 911. This information is not intended to replace advice given to you by your health care provider. Make sure you discuss any questions you have with your health care provider. Document Released: 12/17/2010 Document Revised: 12/28/2016 Document Reviewed: 12/28/2016 Elsevier Interactive Patient Education  2019 [employer].

## 2019-02-05 NOTE — Progress Notes (Signed)
Patient ID: James Avila, male    DOB: 17-Apr-1951  MRN: [id number]  CC: re-establish and URI   Subjective: James Avila is a 68 y.o. male who presents for re-est care.  He has several concerns.  Patient states that he does not come here unless he has to. His concerns today include:  Patient with history of HTN, BPH, cirrhosis, hep C, vit D def, tob dep, insomnia  Patient thinks he is on the tail end of the flu.  Symptoms started 01/30/2019 with extreme fatigue followed by rhinorrhea, sore throat, body aches, head and chest congestion.  Few days later he developed fever up to 102 with diarrhea.  He thinks he may have been exposed at a pool hall call [employer] where he goes 3 days a week to shoot pool with up to 20 buddies.  He found out that 1 of his friends who shoots pool with him is also sick. He is feeling better today and this was the first time that he has left his house since symptoms started 1 week ago.  He has not had any fever in the past 48 hours.  The diarrhea and body aches have resolved.  Slight congestion remains.  He is wondering whether he needs some antibiotics.  Insomnia: Complains of chronic insomnia.  He is a night person who stays up until 2-3 o'clock in the mornings.  This is the time he normally gets in bed.  He turns off all lights and sounds.  He states that he can lay there for 2 hours trying to fall asleep.  He does not drink any caffeinated beverages past 7 PM and drinks about 2 beers a week and usually not late at night.  He denies any street drug use.   He tries various over-the-counter medications to immediately to help with sleep including Benadryl, valerian root, melatonin, and an herbal medication called sleep essential.  Best results has been with Ambien.  Previous PCP is to prescribe 10 mg tablet and he would break it into thirds and take a third of a tablet as needed.   Panic attacks: Reports history of panic attacks intermittently for the past 20 years.  They  have resurfaced over the past 6 months occurring about 3 times a week.  During this episode he feels short of breath and panicky.  He describes the episodes as scary.  A lot of times he tries to talk himself through it.  Episodes can last 5 to 10 minutes.  He was on Zoloft in the past which he found helpful.  He wonders whether he needs to be on something every day versus something to take only when needed  Hep C:  Cured with Harvoni 4 yrs ago. Dx with cirrhosis.  Last saw Dr. Linus Salmons 2015.    HTN: Reports compliance with current blood pressure medications and salt restriction.  Patient Active Problem List   Diagnosis Date Noted  . Nodule of shaft of penis 06/02/2017  . Seborrheic keratoses 05/02/2017  . Insomnia 04/06/2017  . Plantar fasciitis, left 03/06/2017  . Deltoid tendonitis, unspecified laterality 01/20/2017  . Impotence 04/11/2016  . Plantar wart of left foot 04/11/2016  . Vitamin D deficiency 04/08/2016  . Hepatic cirrhosis ([place]) 07/31/2014  . BPH (benign prostatic hyperplasia) 10/10/2013  . Anxiety state, unspecified 10/10/2013  . Chronic back pain 10/10/2013  . TOBACCO USE 09/07/2007  . Hepatitis C virus infection without hepatic coma 05/10/2007  . Essential hypertension 05/10/2007  Current Outpatient Medications on File Prior to Visit  Medication Sig Dispense Refill  . aspirin 81 MG tablet Take 1 tablet (81 mg total) by mouth daily. 90 tablet 3  . carvedilol (COREG) 6.25 MG tablet Take 1 tablet (6.25 mg total) by mouth 2 (two) times daily with a meal. 180 tablet 3  . lisinopril-hydrochlorothiazide (ZESTORETIC) 20-12.5 MG tablet Take 2 tablets by mouth daily. 180 tablet 3  . tamsulosin (FLOMAX) 0.4 MG CAPS capsule Take 2 capsules (0.8 mg total) by mouth daily after supper. 180 capsule 3  . Vitamin D, Ergocalciferol, (DRISDOL) 50000 units CAPS capsule Take 1 capsule (50,000 Units total) by mouth every 7 (seven) days. (Patient not taking: Reported on 02/05/2019) 16 capsule 0    No current facility-administered medications on file prior to visit.     Allergies  Allergen Reactions  . Hydralazine     Causes numbness Increased blood pressure    Social History   Socioeconomic History  . Marital status: Divorced    Spouse name: Not on file  . Number of children: Not on file  . Years of education: Not on file  . Highest education level: Not on file  Occupational History  . Not on file  Social Needs  . Financial resource strain: Not on file  . Food insecurity:    Worry: Not on file    Inability: Not on file  . Transportation needs:    Medical: Not on file    Non-medical: Not on file  Tobacco Use  . Smoking status: Current Every Day Smoker    Packs/day: 0.50    Types: Cigarettes  . Smokeless tobacco: Never Used  . Tobacco comment: Smoking 5-10 cigs/day  Substance and Sexual Activity  . Alcohol use: Yes    Alcohol/week: 4.0 standard drinks    Types: 4 Cans of beer per week    Comment: weekly  . Drug use: No  . Sexual activity: Not on file  Lifestyle  . Physical activity:    Days per week: Not on file    Minutes per session: Not on file  . Stress: Not on file  Relationships  . Social connections:    Talks on phone: Not on file    Gets together: Not on file    Attends religious service: Not on file    Active member of club or organization: Not on file    Attends meetings of clubs or organizations: Not on file    Relationship status: Not on file  . Intimate partner violence:    Fear of current or ex partner: Not on file    Emotionally abused: Not on file    Physically abused: Not on file    Forced sexual activity: Not on file  Other Topics Concern  . Not on file  Social History Narrative  . Not on file    Family History  Problem Relation Age of Onset  . Hypertension Mother   . Hydrocephalus Mother   . Stroke Mother   . Diabetes Mother   . Cancer Father 40       skin     Past Surgical History:  Procedure Laterality Date  .  TONSILLECTOMY      ROS: Review of Systems  Musculoskeletal:       Mentions some chronic intermittent lower back pain for which he uses a back/waist support belt which works well for him    PHYSICAL EXAM: BP 126/82   Pulse 83   Temp 98.4  F (36.9 C) (Oral)   Resp 16   Ht 5' 10.5" (1.791 m)   Wt 165 lb (74.8 kg)   SpO2 97%   BMI 23.34 kg/m   Physical Exam General appearance - alert, well appearing, older Caucasian male and in no distress Mental status - normal mood, behavior, speech, dress, motor activity, and thought processes Nose - normal and patent, no erythema, discharge or polyps Mouth - mucous membranes moist, pharynx normal without lesions Neck - supple, no significant adenopathy Chest - clear to auscultation, no wheezes, rales or rhonchi, symmetric air entry Heart - normal rate, regular rhythm, normal S1, S2, no murmurs, rubs, clicks or gallops Abdomen - soft, nontender, nondistended, no masses or organomegaly Extremities -no lower extremity edema.  CMP Latest Ref Rng & Units 05/10/2018 01/20/2017 02/12/2015  Glucose 65 - 99 mg/dL 113(H) 98 99  BUN 8 - 27 mg/dL 17 16 14   Creatinine 0.76 - 1.27 mg/dL 1.00 0.89 0.89  Sodium 134 - 144 mmol/L 140 139 138  Potassium 3.5 - 5.2 mmol/L 3.6 3.7 3.8  Chloride 96 - 106 mmol/L 99 102 102  CO2 20 - 29 mmol/L 26 27 27   Calcium 8.6 - 10.2 mg/dL 9.2 9.2 8.9  Total Protein 6.0 - 8.5 g/dL 7.0 7.2 7.5  Total Bilirubin 0.0 - 1.2 mg/dL 0.6 0.8 0.7  Alkaline Phos 39 - 117 IU/L 60 50 48  AST 0 - 40 IU/L 13 15 18   ALT 0 - 44 IU/L 10 15 17    Lipid Panel     Component Value Date/Time   CHOL 179 10/08/2014 0912   TRIG 141 10/08/2014 0912   HDL 36 (L) 10/08/2014 0912   CHOLHDL 5.0 10/08/2014 0912   VLDL 28 10/08/2014 0912   LDLCALC 115 (H) 10/08/2014 0912    CBC    Component Value Date/Time   WBC 7.6 05/10/2018 1140   WBC 7.6 01/20/2017 1216   RBC 4.31 05/10/2018 1140   RBC 4.73 01/20/2017 1216   HGB 13.2 05/10/2018 1140    HCT 37.7 05/10/2018 1140   PLT 227 05/10/2018 1140   MCV 88 05/10/2018 1140   MCH 30.6 05/10/2018 1140   MCH 30.7 01/20/2017 1216   MCHC 35.0 05/10/2018 1140   MCHC 34.2 01/20/2017 1216   RDW 13.1 05/10/2018 1140   LYMPHSABS 3.1 05/10/2018 1140   MONOABS 0.6 04/30/2014 1500   EOSABS 0.4 05/10/2018 1140   BASOSABS 0.0 05/10/2018 1140    ASSESSMENT AND PLAN: 1. Viral respiratory illness This sounds like patient is on the tail end of the flu.  He has been afebrile for the past 48 hours and most of his symptoms have resolved.  Lungs are clear and he is not toxic looking.  No need for antibiotics at this time.  General precautions given including good handwashing, cough etiquette and avoid large crowds until symptoms completely resolved.  2. Panic disorder We discussed putting him back on Zoloft since this is worked well for him in the past.  I went over possible side effects including fatigue and mild sexual dysfunction.  Patient advised to take half a tablet a day for the first 2 weeks and then after that increase it to full tablet - sertraline (ZOLOFT) 50 MG tablet; Take 1 tablet (50 mg total) by mouth daily.  Dispense: 30 tablet; Refill: 5  3. Chronic insomnia Good sleep hygiene discussed and encouraged but it sounds as though he is already doing this.  We will put him on Ambien  and he can continue to take a third of the 10 mg tablet as needed.  Encouraged him not to use every night as the medication can become habit forming.  Also warned that the medication can sometimes cause amnesia and sleepwalking - zolpidem (AMBIEN) 10 MG tablet; Take 0.5 tablets (5 mg total) by mouth at bedtime as needed for sleep.  Dispense: 20 tablet; Refill: 0  4. Essential hypertension At goal.  Continue current medications  5. Hepatitis C virus infection cured after antiviral drug therapy 6. Cirrhosis of liver without ascites, unspecified hepatic cirrhosis type [facility]) Patient declines referral for liver  ultrasound to screen for [place]  7. Chronic low back pain without sciatica, unspecified back pain laterality We did not get to discuss this in much detail today due to the urgency of the other issues.   Patient was given the opportunity to ask questions.  Patient verbalized understanding of the plan and was able to repeat key elements of the plan.   No orders of the defined types were placed in this encounter.    Requested Prescriptions   Signed Prescriptions Disp Refills  . sertraline (ZOLOFT) 50 MG tablet 30 tablet 5    Sig: Take 1 tablet (50 mg total) by mouth daily.  Marland Kitchen zolpidem (AMBIEN) 10 MG tablet 20 tablet 0    Sig: Take 0.5 tablets (5 mg total) by mouth at bedtime as needed for sleep.    Return in about 4 months (around 06/07/2019).  Karle Plumber, MD, FACP

## 2019-02-06 MED FILL — TAMSULOSIN HCL 0.4 MG CAP: 0.4 | 90 days supply | Qty: 180 | Fill #2

## 2019-02-06 MED FILL — LISINOPRIL-HCTZ 20-12.5 MG: 20-12.5 | 90 days supply | Qty: 180 | Fill #3

## 2019-02-06 MED FILL — CARVEDILOL 6.25 MG TABLET: 6.25 | 90 days supply | Qty: 180 | Fill #2

## 2019-02-08 ENCOUNTER — Telehealth: Payer: Self-pay | Admitting: Family Medicine

## 2019-02-08 NOTE — Telephone Encounter (Signed)
Pt states when he saw Dr. Wynetta Emery his respiratory symptoms was on the tail end. Pt states he has a lot of congestion and he is having sob. Pt states he is not paranoid and doesn't want to develop bronchitis. Pt is wanting to be prescribed something for his symptoms.  Pt would like rx's sent to  [facility] [address]

## 2019-02-08 NOTE — Telephone Encounter (Signed)
James Avila could you contact pt please

## 2019-02-08 NOTE — Telephone Encounter (Signed)
Follow Up   Pt states he doesn't have the money and doesn't want to be around a bunch of sick people

## 2019-02-08 NOTE — Telephone Encounter (Signed)
Pt came in stated his flu symptoms have gotten worse since his last visit and is now also congested would like to be prescribed something to help

## 2019-02-11 NOTE — Telephone Encounter (Signed)
Called the patient and he didn't answer and there where no vm

## 2019-02-11 NOTE — Telephone Encounter (Signed)
Per Dr. Wynetta Emery last note "If the patient feels that his symptoms have gotten worse,she recommend that he be seen at an urgent care for re-eval or schedule an appointment with walk in

## 2019-05-14 ENCOUNTER — Other Ambulatory Visit: Payer: Self-pay | Admitting: Physician Assistant

## 2019-05-14 DIAGNOSIS — N401 Enlarged prostate with lower urinary tract symptoms: Secondary | ICD-10-CM

## 2019-05-14 DIAGNOSIS — N3943 Post-void dribbling: Secondary | ICD-10-CM

## 2019-05-14 DIAGNOSIS — I10 Essential (primary) hypertension: Secondary | ICD-10-CM

## 2019-05-14 MED FILL — CARVEDILOL 6.25 MG TABLET: 6.25 | 90 days supply | Qty: 180 | Fill #0

## 2019-05-14 MED FILL — TAMSULOSIN HCL 0.4 MG CAP: 0.4 | 90 days supply | Qty: 180 | Fill #0

## 2019-05-15 MED FILL — LISINOPRIL-HCTZ 20-12.5 MG: 20-12.5 | 90 days supply | Qty: 180 | Fill #0

## 2019-06-11 ENCOUNTER — Other Ambulatory Visit: Payer: Self-pay | Admitting: Internal Medicine

## 2019-06-11 DIAGNOSIS — F5104 Psychophysiologic insomnia: Secondary | ICD-10-CM

## 2019-06-11 NOTE — Telephone Encounter (Signed)
Medication refill and increase request

## 2019-06-12 MED ORDER — ZOLPIDEM TARTRATE 10 MG PO TABS: 5.0000 mg | ORAL_TABLET | Freq: Every evening | ORAL | 0 refills | Status: DC | PRN

## 2019-06-13 NOTE — Telephone Encounter (Signed)
[facility] to call in Ambien Rx. Spoke to [facility]   Name: Ambien Strength: 10MG  Sig: Take 0.5 tablets (5 mg total) by mouth at bedtime as needed for sleep  Quantity: 30 Refills: 0  Contacted pt and left a detailed of Dr. Wynetta Emery message and made aware that I have called in rx for Ambien to preferred pharmacy

## 2019-08-07 ENCOUNTER — Other Ambulatory Visit: Payer: Self-pay | Admitting: Internal Medicine

## 2019-08-07 ENCOUNTER — Other Ambulatory Visit: Payer: Self-pay | Admitting: Family Medicine

## 2019-08-07 DIAGNOSIS — N3943 Post-void dribbling: Secondary | ICD-10-CM

## 2019-08-07 DIAGNOSIS — I10 Essential (primary) hypertension: Secondary | ICD-10-CM

## 2019-08-07 DIAGNOSIS — N401 Enlarged prostate with lower urinary tract symptoms: Secondary | ICD-10-CM

## 2019-08-07 MED FILL — CARVEDILOL 6.25 MG TABLET: 6.25 | 30 days supply | Qty: 60 | Fill #0

## 2019-08-07 MED FILL — TAMSULOSIN HCL 0.4 MG CAP: 0.4 | 30 days supply | Qty: 60 | Fill #0

## 2019-08-07 MED FILL — LISINOPRIL-HCTZ 20-12.5 MG: 20-12.5 | 30 days supply | Qty: 60 | Fill #0

## 2019-08-20 MED FILL — LISINOPRIL-HCTZ 20-12.5 MG: 20-12.5 | 30 days supply | Qty: 60 | Fill #0

## 2019-08-20 MED FILL — CARVEDILOL 6.25 MG TABLET: 6.25 | 30 days supply | Qty: 60 | Fill #0

## 2019-08-20 MED FILL — TAMSULOSIN HCL 0.4 MG CAP: 0.4 | 30 days supply | Qty: 60 | Fill #0

## 2019-08-28 ENCOUNTER — Ambulatory Visit: Payer: Medicare HMO | Attending: Family Medicine | Admitting: Physician Assistant

## 2019-08-28 DIAGNOSIS — F410 Panic disorder [episodic paroxysmal anxiety] without agoraphobia: Secondary | ICD-10-CM | POA: Diagnosis not present

## 2019-08-28 DIAGNOSIS — N3943 Post-void dribbling: Secondary | ICD-10-CM

## 2019-08-28 DIAGNOSIS — F5104 Psychophysiologic insomnia: Secondary | ICD-10-CM | POA: Diagnosis not present

## 2019-08-28 DIAGNOSIS — N401 Enlarged prostate with lower urinary tract symptoms: Secondary | ICD-10-CM

## 2019-08-28 DIAGNOSIS — I10 Essential (primary) hypertension: Secondary | ICD-10-CM | POA: Diagnosis not present

## 2019-08-28 MED ORDER — SERTRALINE HCL 50 MG PO TABS: 50.0000 mg | ORAL_TABLET | Freq: Every day | ORAL | 5 refills | Status: DC

## 2019-08-28 MED ORDER — LISINOPRIL-HYDROCHLOROTHIAZIDE 20-12.5 MG PO TABS: 2.0000 | ORAL_TABLET | Freq: Every day | ORAL | 3 refills | Status: DC

## 2019-08-28 MED ORDER — CARVEDILOL 6.25 MG PO TABS: 6.2500 mg | ORAL_TABLET | Freq: Two times a day (BID) | ORAL | 2 refills | Status: DC

## 2019-08-28 MED ORDER — ZOLPIDEM TARTRATE 10 MG PO TABS: 5.0000 mg | ORAL_TABLET | Freq: Every evening | ORAL | 1 refills | Status: DC | PRN

## 2019-08-28 MED ORDER — TAMSULOSIN HCL 0.4 MG PO CAPS: 0.8000 mg | ORAL_CAPSULE | Freq: Every day | ORAL | 4 refills | Status: DC

## 2019-08-28 MED FILL — SERTRALINE HCL 50 MG TABLET: 50 | 30 days supply | Qty: 30 | Fill #0

## 2019-08-28 NOTE — Progress Notes (Signed)
Virtual Visit via Telephone Note  I connected with James Avila on 08/28/19 at  2:10 PM EDT by telephone and verified that I am speaking with the correct person using two identifiers.   I discussed the limitations, risks, security and privacy concerns of performing an evaluation and management service by telephone and the availability of in person appointments. I also discussed with the patient that there may be a patient responsible charge related to this service. The patient expressed understanding and agreed to proceed.  Patient location:  home My Location:  home office Persons on the call:  Me and the patient   History of Present Illness: Patient needs med RF.  He has checked BP OOO and says numbers 120-130/80s.  Denies CP/dizziness/HA.  Declines bloodwork today but says he will see his PCP to do that and discuss viagra.     Observations/Objective:  A&Ox3   Assessment and Plan: 1. Essential hypertension Controlled based on OOO readings - carvedilol (COREG) 6.25 MG tablet; Take 1 tablet (6.25 mg total) by mouth 2 (two) times daily with a meal.  Dispense: 180 tablet; Refill: 2 - lisinopril-hydrochlorothiazide (ZESTORETIC) 20-12.5 MG tablet; Take 2 tablets by mouth daily.  Dispense: 60 tablet; Refill: 3  2. Benign prostatic hyperplasia with post-void dribbling - tamsulosin (FLOMAX) 0.4 MG CAPS capsule; Take 2 capsules (0.8 mg total) by mouth daily after supper.  Dispense: 180 capsule; Refill: 4  3. Panic disorder - sertraline (ZOLOFT) 50 MG tablet; Take 1 tablet (50 mg total) by mouth daily.  Dispense: 30 tablet; Refill: 5  4. Chronic insomnia - zolpidem (AMBIEN) 10 MG tablet; Take 0.5 tablets (5 mg total) by mouth at bedtime as needed for sleep.  Dispense: 30 tablet; Refill: 1    Follow Up Instructions: See pcp 6-8 weeks.  Needs labs   I discussed the assessment and treatment plan with the patient. The patient was provided an opportunity to ask questions and all were  answered. The patient agreed with the plan and demonstrated an understanding of the instructions.   The patient was advised to call back or seek an in-person evaluation if the symptoms worsen or if the condition fails to improve as anticipated.  I provided 11 minutes of non-face-to-face time during this encounter.   Freeman Caldron, PA-C  Patient ID: James Avila, male   DOB: Apr 10, 1951, 68 y.o.   MRN: [id number]

## 2019-10-04 ENCOUNTER — Other Ambulatory Visit: Payer: Self-pay

## 2019-10-04 DIAGNOSIS — Z20822 Contact with and (suspected) exposure to covid-19: Secondary | ICD-10-CM

## 2019-10-05 LAB — NOVEL CORONAVIRUS, NAA: SARS-CoV-2, NAA: NOT DETECTED

## 2019-10-11 ENCOUNTER — Ambulatory Visit (HOSPITAL_BASED_OUTPATIENT_CLINIC_OR_DEPARTMENT_OTHER): Payer: Medicare HMO | Admitting: Internal Medicine

## 2019-10-11 ENCOUNTER — Other Ambulatory Visit: Payer: Self-pay

## 2019-10-11 NOTE — Progress Notes (Signed)
Pt states he has been having congestion, sob, headache, cough, loss appetite and nausea  Pt is a requesting a rx for vigra

## 2019-10-14 ENCOUNTER — Ambulatory Visit (INDEPENDENT_AMBULATORY_CARE_PROVIDER_SITE_OTHER): Payer: Medicare HMO | Admitting: Family Medicine

## 2019-10-14 DIAGNOSIS — J440 Chronic obstructive pulmonary disease with acute lower respiratory infection: Secondary | ICD-10-CM | POA: Diagnosis not present

## 2019-10-14 DIAGNOSIS — R110 Nausea: Secondary | ICD-10-CM

## 2019-10-14 DIAGNOSIS — F5104 Psychophysiologic insomnia: Secondary | ICD-10-CM

## 2019-10-14 DIAGNOSIS — N401 Enlarged prostate with lower urinary tract symptoms: Secondary | ICD-10-CM | POA: Diagnosis not present

## 2019-10-14 DIAGNOSIS — R630 Anorexia: Secondary | ICD-10-CM | POA: Diagnosis not present

## 2019-10-14 DIAGNOSIS — I10 Essential (primary) hypertension: Secondary | ICD-10-CM

## 2019-10-14 DIAGNOSIS — R634 Abnormal weight loss: Secondary | ICD-10-CM

## 2019-10-14 DIAGNOSIS — F410 Panic disorder [episodic paroxysmal anxiety] without agoraphobia: Secondary | ICD-10-CM | POA: Diagnosis not present

## 2019-10-14 DIAGNOSIS — J209 Acute bronchitis, unspecified: Secondary | ICD-10-CM | POA: Diagnosis not present

## 2019-10-14 DIAGNOSIS — N3943 Post-void dribbling: Secondary | ICD-10-CM | POA: Diagnosis not present

## 2019-10-14 MED ORDER — LISINOPRIL-HYDROCHLOROTHIAZIDE 20-12.5 MG PO TABS: 2.0000 | ORAL_TABLET | Freq: Every day | ORAL | 0 refills | Status: DC

## 2019-10-14 MED ORDER — PREDNISONE 20 MG PO TABS: 20.0000 mg | ORAL_TABLET | Freq: Every day | ORAL | 0 refills | Status: DC

## 2019-10-14 MED ORDER — ZOLPIDEM TARTRATE 5 MG PO TABS: ORAL_TABLET | ORAL | 0 refills | Status: DC

## 2019-10-14 MED ORDER — ONDANSETRON HCL 4 MG PO TABS: 4.0000 mg | ORAL_TABLET | Freq: Three times a day (TID) | ORAL | 0 refills | Status: DC | PRN

## 2019-10-14 MED ORDER — AZITHROMYCIN 250 MG PO TABS: ORAL_TABLET | ORAL | 0 refills | Status: DC

## 2019-10-14 MED ORDER — CARVEDILOL 6.25 MG PO TABS: 6.2500 mg | ORAL_TABLET | Freq: Two times a day (BID) | ORAL | 0 refills | Status: DC

## 2019-10-14 MED ORDER — SERTRALINE HCL 50 MG PO TABS: 50.0000 mg | ORAL_TABLET | Freq: Every day | ORAL | 0 refills | Status: DC

## 2019-10-14 MED ORDER — TAMSULOSIN HCL 0.4 MG PO CAPS: 0.8000 mg | ORAL_CAPSULE | Freq: Every day | ORAL | 0 refills | Status: DC

## 2019-10-14 NOTE — Progress Notes (Signed)
Virtual Visit via Telephone Note  I connected with James Avila 10/14/19 at  3:50 PM EST by telephone and verified that I am speaking with the correct person using two identifiers.   I discussed the limitations, risks, security and privacy concerns of performing an evaluation and management service by telephone and the availability of in person appointments. I also discussed with the patient that there may be a patient responsible charge related to this service. The patient expressed understanding and agreed to proceed.  Patient Location: Home Provider Location: Home Office Others participating in call: call initiated by Allyne Gee, CMA who then transferred the call to me.    History of Present Illness:      68 yo male who is a patient of Karle Plumber, MD, who reports that since about September 25, 2019 he has had issues with nausea and decreased appetite as well as occasional chills. He has also had a recurrent cough that is occasionally productive of green sputum and he has nasal congestion and postnasal drainage as well. He does have a history of tobacco use but currently smoke about 5 cigarettes daily but admits to heavier tobacco use in the past. He reports that he is mostly drinking water and eating about 2 small meals per day and has lost between 10-12 pounds since the end of October.        He would also like to have 90 day refills of his medications.  He reports that he is taking his blood pressure medication daily and denies headaches or dizziness related to his blood pressure.  He does have some occasional nocturia and would like to continue use of Flomax.  He also reports that he has chronic issues with anxiety and occasional panic attacks.  He would like to have refill of Zoloft.  He denies suicidal thoughts or ideations.        He reports that he is concerned about his health as over the past few months he feels that his appetite has gone away and he has some recurrent issues  with nausea and would like to have medication to help with the nausea.  He feels that he has lost weight due to his loss of appetite.  He is not sure of the cause of his current symptoms.  He denies any recent follow-up with gastroenterology.  He denies any current abdominal pain, no diarrhea or constipation.  No change in bowel habits or stool color.  No blood in the stool or black stools.  He denies any recent fever but has recently felt as if he is having chills.  No night sweats, no jaundice and no peripheral edema.  He denies any chest pain, no palpitations and no syncopal episodes.  Past Medical History:  Diagnosis Date  . Anxiety state, unspecified 10/10/2013  . BPH (benign prostatic hyperplasia) 10/10/2013  . HEPATITIS C 05/10/2007   Qualifier: Diagnosis of  By: Leanne Chang MD, Bruce    . Hypertension   . TOBACCO USE 09/07/2007   Qualifier: Diagnosis of  By: Leanne Chang MD, Bruce      Past Surgical History:  Procedure Laterality Date  . TONSILLECTOMY      Family History  Problem Relation Age of Onset  . Hypertension Mother   . Hydrocephalus Mother   . Stroke Mother   . Diabetes Mother   . Cancer Father 47       skin     Social History   Tobacco Use  . Smoking status: Current  Every Day Smoker    Packs/day: 0.50    Types: Cigarettes  . Smokeless tobacco: Never Used  . Tobacco comment: Smoking 5-10 cigs/day  Substance Use Topics  . Alcohol use: Yes    Alcohol/week: 4.0 standard drinks    Types: 4 Cans of beer per week    Comment: weekly  . Drug use: No     Allergies  Allergen Reactions  . Hydralazine     Causes numbness Increased blood pressure       Observations/Objective: No vital signs or physical exam conducted as visit was done via telephone  Assessment and Plan: 1. Acute bronchitis with COPD ([place]) He reports symptoms consistent with acute bronchitis with COPD.  Prescription provided for azithromycin Z-Pak and prednisone 40 mg daily x5 days to take after eating.   He is encouraged to try Mucinex or over-the-counter Robitussin-DM to help with cough and congestion.  If he has any acute worsening of shortness of breath/difficulty breathing or any concerns he is encouraged to go to the emergency department for further evaluation.  Discussed the need for complete smoking cessation which patient is not ready to commit to at this time. - azithromycin (ZITHROMAX) 250 MG tablet; Take 2 pills today then 1 pill daily for 4 days  Dispense: 6 tablet; Refill: 0 - predniSONE (DELTASONE) 20 MG tablet; Take 1 tablet (20 mg total) by mouth daily with breakfast. Take 2 pills once daily for 5 days; take after eating  Dispense: 10 tablet; Refill: 0  2. Nausea; 3. Loss of appetite; 4. Abnormal weight loss He reports ongoing issues with nausea, loss of appetite and abnormal weight loss.  He is encouraged to follow-up with his PCP in the next 4 weeks for further evaluation of his symptoms.  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  He is encouraged to discuss need for GI follow-up with his primary care physician.  If he is already established with GI or infectious disease he can also schedule his own follow-up appointment. - ondansetron (ZOFRAN) 4 MG tablet; Take 1 tablet (4 mg total) by mouth every 8 (eight) hours as needed for nausea or vomiting.  Dispense: 20 tablet; Refill: 0  5. Essential hypertension He reports the need for refills of his medications for treatment of hypertension and prescriptions provided for carvedilol and lisinopril-hydrochlorothiazide which he is currently taking.  He is encouraged to continue a low-sodium diet and to periodically monitor his blood pressure with goal blood pressure of 130/80 or as per his PCP's recommendations. - carvedilol (COREG) 6.25 MG tablet; Take 1 tablet (6.25 mg total) by mouth 2 (two) times daily with a meal.  Dispense: 180 tablet; Refill: 0 - lisinopril-hydrochlorothiazide (ZESTORETIC)  20-12.5 MG tablet; Take 2 tablets by mouth daily.  Dispense: 180 tablet; Refill: 0  6. Panic disorder He reports history of generalized anxiety and panic attacks.  He reports that sertraline has been helpful and new prescription provided at today's visit - sertraline (ZOLOFT) 50 MG tablet; Take 1 tablet (50 mg total) by mouth daily.  Dispense: 90 tablet; Refill: 0  7. Benign prostatic hyperplasia with post-void dribbling Patient with longstanding issues with BPH per his report and he is provided with refill of Flomax.  On review of chart, last PSA was 0.6 in 2018. - tamsulosin (FLOMAX) 0.4 MG CAPS capsule; Take 2 capsules (0.8 mg total) by mouth daily after supper.  Dispense: 180 capsule; Refill: 0  8. Chronic insomnia He  requests refill of Ambien for treatment of chronic insomnia.  It appears that he may be taking more than the 5 mg dose prescribed as his prior prescription was for 10 mg to take half pill and patient reports that he is now out of medication.  He is provided with 30-day supply of Ambien 5 mg to take at bedtime as needed for insomnia and to return in the next 2 to 3 weeks for follow-up with his PCP regarding additional refills as well as his complaints of weight loss, nausea, loss of appetite and patient with history of hepatitis C with cirrhosis. - zolpidem (AMBIEN) 5 MG tablet; Take 5 mg at bedtime as needed for sleep  Dispense: 30 tablet; Refill: 0  Follow Up Instructions:Return for 2-3 week follow-up with Dr. Wynetta Emery.    I discussed the assessment and treatment plan with the patient. The patient was provided an opportunity to ask questions and all were answered. The patient agreed with the plan and demonstrated an understanding of the instructions.   The patient was advised to call back or seek an in-person evaluation if the symptoms worsen or if the condition fails to improve as anticipated.  I provided 22 minutes of non-face-to-face time during this encounter.   Antony Blackbird, MD

## 2019-10-15 MED FILL — LISINOPRIL-HCTZ 20-12.5 MG: 20-12.5 | 90 days supply | Qty: 180 | Fill #0

## 2019-10-15 MED FILL — ONDANSETRON HCL 4 MG TABLET: 4 | 6 days supply | Qty: 20 | Fill #0

## 2019-10-15 MED FILL — AZITHROMYCIN 250 MG TABLET: 250 | 5 days supply | Qty: 6 | Fill #0

## 2019-10-15 MED FILL — TAMSULOSIN HCL 0.4 MG CAP: 0.4 | 90 days supply | Qty: 180 | Fill #0

## 2019-10-15 MED FILL — predniSONE 20 MG TABS: 20 | 5 days supply | Qty: 10 | Fill #0

## 2019-10-15 MED FILL — CARVEDILOL 6.25 MG TABLET: 6.25 | 90 days supply | Qty: 180 | Fill #0

## 2019-10-15 MED FILL — SERTRALINE HCL 50 MG TABS: 50 | 90 days supply | Qty: 90 | Fill #0

## 2019-11-08 ENCOUNTER — Ambulatory Visit: Payer: Medicare HMO | Attending: Internal Medicine | Admitting: Internal Medicine

## 2019-11-08 ENCOUNTER — Encounter: Payer: Self-pay | Admitting: Internal Medicine

## 2019-11-08 ENCOUNTER — Other Ambulatory Visit: Payer: Self-pay

## 2019-11-08 DIAGNOSIS — F17200 Nicotine dependence, unspecified, uncomplicated: Secondary | ICD-10-CM

## 2019-11-08 DIAGNOSIS — I10 Essential (primary) hypertension: Secondary | ICD-10-CM | POA: Diagnosis not present

## 2019-11-08 DIAGNOSIS — F5104 Psychophysiologic insomnia: Secondary | ICD-10-CM

## 2019-11-08 DIAGNOSIS — F17210 Nicotine dependence, cigarettes, uncomplicated: Secondary | ICD-10-CM | POA: Diagnosis not present

## 2019-11-08 DIAGNOSIS — N529 Male erectile dysfunction, unspecified: Secondary | ICD-10-CM | POA: Diagnosis not present

## 2019-11-08 DIAGNOSIS — Z23 Encounter for immunization: Secondary | ICD-10-CM

## 2019-11-08 DIAGNOSIS — Z125 Encounter for screening for malignant neoplasm of prostate: Secondary | ICD-10-CM

## 2019-11-08 DIAGNOSIS — F410 Panic disorder [episodic paroxysmal anxiety] without agoraphobia: Secondary | ICD-10-CM

## 2019-11-08 MED ORDER — NICOTINE 21 MG/24HR TD PT24: 21.0000 mg | MEDICATED_PATCH | Freq: Every day | TRANSDERMAL | 1 refills | Status: DC

## 2019-11-08 MED ORDER — NICOTINE 14 MG/24HR TD PT24: 14.0000 mg | MEDICATED_PATCH | Freq: Every day | TRANSDERMAL | 0 refills | Status: DC

## 2019-11-08 MED ORDER — SILDENAFIL CITRATE 50 MG PO TABS: ORAL_TABLET | ORAL | 6 refills | Status: DC

## 2019-11-08 NOTE — Progress Notes (Signed)
Virtual Visit via Telephone Note Due to current restrictions/limitations of in-office visits due to the COVID-19 pandemic, this scheduled clinical appointment was converted to a telehealth visit  I connected with James Avila on 11/08/19 at 3:04 p.m by telephone and verified that I am speaking with the correct person using two identifiers. I am in my office.  The patient is at home.  Only the patient and myself participated in this encounter.  I discussed the limitations, risks, security and privacy concerns of performing an evaluation and management service by telephone and the availability of in person appointments. I also discussed with the patient that there may be a patient responsible charge related to this service. The patient expressed understanding and agreed to proceed.   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 Chapman Fitch about a month ago.  At that time he was having nausea, weight loss with decreased appetite and some respiratory symptoms.  He was treated with Zithromax and prednisone for bronchitis. -Today he reports he is doing much better.  His appetite has returned and he has regained his weight.  The nausea has resolved.  Panic Disorder: Reportedly doing well on Zoloft.    HTN: BP readings have been good.  In the 120/80.  Checks once a wk.   No HA or dizziness lately No CP/SOB/lower extremity edema   Tob Dep: smoking 1/2 pk a day.  Ready to quit.    Insomnia: doing well with Ambien does not take it every night.  Usually takes a 1/2 tab and if that is not enough, he takes a 1/4 more.  No S.E from the Ambien Some cramps in legs at nights  Hep C with cirrhosis, cured:  Pt does not want another liver U/S  Would like rxn for Viagra.  Problems maintaining erection x 3 yrs.  Been on Viagra before with good results.   HM: due for flu and Prevnar Outpatient  Encounter Medications as of 11/08/2019  Medication Sig  . aspirin 81 MG tablet Take 1 tablet (81 mg total) by mouth daily.  Marland Kitchen azithromycin (ZITHROMAX) 250 MG tablet Take 2 pills today then 1 pill daily for 4 days (Patient not taking: Reported on 11/08/2019)  . carvedilol (COREG) 6.25 MG tablet Take 1 tablet (6.25 mg total) by mouth 2 (two) times daily with a meal.  . Cholecalciferol (VITAMIN D3 PO) Take 5,000 mg by mouth daily.  Marland Kitchen lisinopril-hydrochlorothiazide (ZESTORETIC) 20-12.5 MG tablet Take 2 tablets by mouth daily.  . ondansetron (ZOFRAN) 4 MG tablet Take 1 tablet (4 mg total) by mouth every 8 (eight) hours as needed for nausea or vomiting. (Patient not taking: Reported on 11/08/2019)  . predniSONE (DELTASONE) 20 MG tablet Take 1 tablet (20 mg total) by mouth daily with breakfast. Take 2 pills once daily for 5 days; take after eating (Patient not taking: Reported on 11/08/2019)  . sertraline (ZOLOFT) 50 MG tablet Take 1 tablet (50 mg total) by mouth daily.  . tamsulosin (FLOMAX) 0.4 MG CAPS capsule Take 2 capsules (0.8 mg total) by mouth daily after supper.  . zolpidem (AMBIEN) 5 MG tablet Take 5 mg at bedtime as needed for sleep   No facility-administered encounter medications on file as of 11/08/2019.    Observations/Objective: No direct observation done as this was a telephone encounter.  Assessment and Plan: 1. Essential hypertension Reported blood pressure readings are  at goal.  Continue lisinopril/HCTZ, carvedilol - CBC; Future - Comprehensive metabolic panel; Future - Lipid panel; Future  2. Chronic insomnia Doing well on Ambien which she takes as needed.  He denies any adverse side effects from the medication  3. Panic disorder Doing well on Zoloft.  4. Tobacco dependence Advised to quit.  He is aware of health risks associated with smoking.  He wants to give a trial of quitting but feels he would need help.  Discussed methods to help him quit.  He is most interested  in trying the nicotine patch.  Went over stepdown approach.  We will start him on the 21 mg patch.  Went over how to use the patches.  Less than 5 minutes spent on counseling. - nicotine (NICODERM CQ - DOSED IN MG/24 HOURS) 21 mg/24hr patch; Place 1 patch (21 mg total) onto the skin daily.  Dispense: 28 patch; Refill: 1 - nicotine (NICODERM CQ - DOSED IN MG/24 HOURS) 14 mg/24hr patch; Place 1 patch (14 mg total) onto the skin daily.  Dispense: 28 patch; Refill: 0  5. Erectile dysfunction, unspecified erectile dysfunction type Pt advised of possible side effects of this medication including prolong erection, flushing, headaches, stuff nose and sudden vision and hearing changes.  Pt told to be seen in ER if he has erection lasting longer than 3-4 hrs, or if he has sudden vision changes or hearing loss. - sildenafil (VIAGRA) 50 MG tablet; Take 1 tablet half hour to 1 hour before sexual intercourse. Limit to 1 tab/24 hrs  Dispense: 10 tablet; Refill: 6  6. Need for influenza vaccination 7. Need for vaccination against Streptococcus pneumoniae using pneumococcal conjugate vaccine 13 Patient has schedule an appointment for a nurse only visit next week to get these vaccines  8. Prostate cancer screening - PSA; Future   Follow Up Instructions: 3-4 months   I discussed the assessment and treatment plan with the patient. The patient was provided an opportunity to ask questions and all were answered. The patient agreed with the plan and demonstrated an understanding of the instructions.   The patient was advised to call back or seek an in-person evaluation if the symptoms worsen or if the condition fails to improve as anticipated.  I provided 16 minutes of non-face-to-face time during this encounter.   Karle Plumber, MD

## 2019-11-12 ENCOUNTER — Ambulatory Visit: Payer: Medicare HMO | Attending: Internal Medicine | Admitting: Pharmacist

## 2019-11-12 ENCOUNTER — Other Ambulatory Visit: Payer: Self-pay

## 2019-11-12 DIAGNOSIS — Z23 Encounter for immunization: Secondary | ICD-10-CM | POA: Diagnosis not present

## 2019-11-12 NOTE — Progress Notes (Signed)
Patient presents for vaccination against strep pneumo and influenza per orders of Dr. Wynetta Emery. Consent given. Counseling provided. No contraindications exists. Vaccine administered without incident.

## 2020-02-18 MED FILL — SERTRALINE HCL 50 MG TABLET: 50 | 90 days supply | Qty: 90 | Fill #1

## 2020-02-18 MED FILL — CARVEDILOL 6.25 MG TABLET: 6.25 | 90 days supply | Qty: 180 | Fill #0

## 2020-02-18 MED FILL — LISINOPRIL-HCTZ 20-12.5 MG: 20-12.5 | 90 days supply | Qty: 180 | Fill #0

## 2020-02-18 MED FILL — TAMSULOSIN HCL 0.4 MG CAP: 0.4 | 90 days supply | Qty: 180 | Fill #0

## 2020-03-19 ENCOUNTER — Other Ambulatory Visit: Payer: Self-pay

## 2020-03-19 ENCOUNTER — Telehealth: Payer: Self-pay | Admitting: Internal Medicine

## 2020-03-19 ENCOUNTER — Encounter: Payer: Self-pay | Admitting: Internal Medicine

## 2020-03-19 ENCOUNTER — Ambulatory Visit: Payer: Medicare HMO | Attending: Internal Medicine | Admitting: Internal Medicine

## 2020-03-19 DIAGNOSIS — G4489 Other headache syndrome: Secondary | ICD-10-CM | POA: Diagnosis not present

## 2020-03-19 DIAGNOSIS — F17200 Nicotine dependence, unspecified, uncomplicated: Secondary | ICD-10-CM | POA: Diagnosis not present

## 2020-03-19 DIAGNOSIS — H538 Other visual disturbances: Secondary | ICD-10-CM | POA: Diagnosis not present

## 2020-03-19 DIAGNOSIS — I10 Essential (primary) hypertension: Secondary | ICD-10-CM

## 2020-03-19 DIAGNOSIS — F5104 Psychophysiologic insomnia: Secondary | ICD-10-CM | POA: Diagnosis not present

## 2020-03-19 MED ORDER — BUTALBITAL-APAP-CAFFEINE 50-325-40 MG PO TABS: 2.0000 | ORAL_TABLET | Freq: Every day | ORAL | 0 refills | Status: DC | PRN

## 2020-03-19 MED ORDER — NICOTINE 21 MG/24HR TD PT24: 21.0000 mg | MEDICATED_PATCH | Freq: Every day | TRANSDERMAL | 1 refills | Status: DC

## 2020-03-19 MED ORDER — ZOLPIDEM TARTRATE 5 MG PO TABS: ORAL_TABLET | ORAL | 0 refills | Status: DC

## 2020-03-19 MED ORDER — CARVEDILOL 12.5 MG PO TABS: 12.5000 mg | ORAL_TABLET | Freq: Two times a day (BID) | ORAL | 6 refills | Status: DC

## 2020-03-19 MED FILL — CARVEDILOL 12.5 MG TABLET: 12.5 | 30 days supply | Qty: 60 | Fill #0

## 2020-03-19 MED FILL — BUTALB-ACETAMIN-CAFF 50-325: 50-325-40 | 10 days supply | Qty: 20 | Fill #0

## 2020-03-19 NOTE — Progress Notes (Signed)
Virtual Visit via Telephone Note Patient was scheduled for in person visit.  However he states he received a VM message yesterday from someone called Tamela Oddi saying it was to be a video visit.  I connected with [name] on 03/19/20 at 11:34 a.m by telephone and verified that I am speaking with the correct person using two identifiers. . I discussed the limitations, risks, security and privacy concerns of performing an evaluation and management service by telephone and the availability of in person appointments. I also discussed with the patient that there may be a patient responsible charge related to this service. The patient expressed understanding and agreed to proceed.   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  Most prominent in LT eye and more intense in the mornings. He endorses +photophobia, phenophobia with the headaches and some dizziness last wk.  Dizziness worse during HA.  Occasional nausea.  Little redness in eyes, no tearing -Headaches last 12+ hrs. Occurring 4/7 days.  Wakes up with HA and endorses loud snoring. no triggers No past Hx of HA Better laying down with a sleep mask over his eyes. He has been taking naproxen which takes about 3 hours to knock it out.  He does not take naproxen every day.  He also takes naproxen as needed for chronic back pain. -He has a strong family history of glaucoma.  He has not had an eye exam in over 30 years.  He thinks he may have glaucoma as the cause of the eye pain and headaches and would like to see an ophthalmologist.   HTN:  Was not checking BP consistently.  Just started checking several times a day 3-4 days ago and they have been high.  Lowest was 120/90 but most in the 140-150s.  Blood pressure and pulse 2 days ago was 162/95 with pulse of  67, blood pressure yesterday was 171/104 with a pulse of 77, blood pressure this morning was 148/93.  Most of the pulse readings have been in the 70s.  He reports compliance with taking lisinopril/HCTZ and carvedilol.  However he does not take them consistently around the same time every day because he wakes up at different times in the mornings. -"I do love salt."  Tobacco dependence: States that he is still wanting to quit smoking.  He did not get the nicotine patches as yet because he was shopping around on prices.  He found the most reasonable at [facility] and would like for me to send the prescription there for the 21 mg patches.  Insomnia: He is requesting refill on Ambien which he takes as needed but not every night. He is also requesting a prescription for Naprosyn to be sent to our pharmacy Outpatient Encounter Medications as of 03/19/2020  Medication Sig  . aspirin 81 MG tablet Take 1 tablet (81 mg total) by mouth daily.  . carvedilol (COREG) 6.25 MG tablet Take 1 tablet (6.25 mg total) by mouth 2 (two) times daily with a meal.  . Cholecalciferol (VITAMIN D3 PO) Take 5,000 mg by mouth daily.  Marland Kitchen lisinopril-hydrochlorothiazide (ZESTORETIC) 20-12.5 MG tablet Take 2 tablets by mouth daily.  . nicotine (NICODERM CQ - DOSED IN MG/24 HOURS) 14 mg/24hr patch Place 1 patch (14 mg total) onto the skin daily.  . nicotine (NICODERM CQ -  DOSED IN MG/24 HOURS) 21 mg/24hr patch Place 1 patch (21 mg total) onto the skin daily.  . ondansetron (ZOFRAN) 4 MG tablet Take 1 tablet (4 mg total) by mouth every 8 (eight) hours as needed for nausea or vomiting. (Patient not taking: Reported on 11/08/2019)  . sertraline (ZOLOFT) 50 MG tablet Take 1 tablet (50 mg total) by mouth daily.  . sildenafil (VIAGRA) 50 MG tablet Take 1 tablet half hour to 1 hour before sexual intercourse. Limit to 1 tab/24 hrs  . tamsulosin (FLOMAX) 0.4 MG CAPS capsule Take 2 capsules (0.8 mg total) by mouth daily after supper.  . zolpidem  (AMBIEN) 5 MG tablet Take 5 mg at bedtime as needed for sleep   No facility-administered encounter medications on file as of 03/19/2020.    Observations/Objective: No direct observation done as this was a telephone visit.  Assessment and Plan: 1. Headache syndrome Differential diagnoses include new onset migraine headaches versus OSA versus due to uncontrolled blood pressure.  I recommend getting imaging study of the head and starting him on Fioricet as needed.  Advised and encouraged him to try not to use the medicine more than twice a week as frequent use can cause rebound headaches and medication can be habit-forming.  I also recommend referral to a neurologist and for sleep study. -Patient wanting to hold off on imaging and referral to neurologist and sleep study.  States that he wants to see the ophthalmologist first to see whether it is glaucoma.  I have submitted referral to ophthalmology  2. Essential hypertension Not at goal.  Increase carvedilol to 12.5 mg twice a day.  Advised that the goal is to get blood pressure 130/80 or lower.  He is to let me know if his pulse readings fall below 60.  Recommend hold off on Naprosyn for now as some people are sensitive to the effects of NSAIDs which can elevate blood pressure - carvedilol (COREG) 12.5 MG tablet; Take 1 tablet (12.5 mg total) by mouth 2 (two) times daily with a meal.  Dispense: 60 tablet; Refill: 6  3. Tobacco dependence Advised to quit.  Prescription sent for the nicotine patches as he is requested. - nicotine (NICODERM CQ - DOSED IN MG/24 HOURS) 21 mg/24hr patch; Place 1 patch (21 mg total) onto the skin daily.  Dispense: 28 patch; Refill: 1  4. Chronic insomnia Refill given on Ambien.  [place] controlled substance reporting system reviewed. - zolpidem (AMBIEN) 5 MG tablet; Take 5 mg at bedtime as needed for sleep  Dispense: 30 tablet; Refill: 0  5.  Blurred vision Referral to ophthalmology. Follow Up  Instructions: 1 month in person.   I discussed the assessment and treatment plan with the patient. The patient was provided an opportunity to ask questions and all were answered. The patient agreed with the plan and demonstrated an understanding of the instructions.   The patient was advised to call back or seek an in-person evaluation if the symptoms worsen or if the condition fails to improve as anticipated.  I provided 30 minutes of non-face-to-face time during this encounter.   Karle Plumber, MD

## 2020-03-19 NOTE — Progress Notes (Signed)
Pt states he has been getting throbbing headaches   Pt is needing medication refilled  [facility] [place]  [facility]  [facility] [address]   Pt states his bp has been running high even with medication. Pt states his bp has been high of 179/104

## 2020-03-19 NOTE — Telephone Encounter (Signed)
1) Medication(s) Requested (by name): [facility]  2) Pharmacy of Choice:   Patient also states that his BP has been high. Patient states that his BP has been high 179/104 with medicationja.

## 2020-03-31 DIAGNOSIS — H40023 Open angle with borderline findings, high risk, bilateral: Secondary | ICD-10-CM | POA: Diagnosis not present

## 2020-03-31 DIAGNOSIS — H35013 Changes in retinal vascular appearance, bilateral: Secondary | ICD-10-CM | POA: Diagnosis not present

## 2020-03-31 DIAGNOSIS — H2513 Age-related nuclear cataract, bilateral: Secondary | ICD-10-CM | POA: Diagnosis not present

## 2020-03-31 DIAGNOSIS — H524 Presbyopia: Secondary | ICD-10-CM | POA: Diagnosis not present

## 2020-03-31 DIAGNOSIS — H35033 Hypertensive retinopathy, bilateral: Secondary | ICD-10-CM | POA: Diagnosis not present

## 2020-04-06 ENCOUNTER — Ambulatory Visit: Payer: Medicare HMO | Admitting: Internal Medicine

## 2020-05-22 MED FILL — LISINOPRIL-HCTZ 20-12.5 MG: 20-12.5 | 30 days supply | Qty: 60 | Fill #1

## 2020-05-22 MED FILL — SERTRALINE HCL 50 MG TABLET: 50 | 60 days supply | Qty: 60 | Fill #2

## 2020-05-22 MED FILL — CARVEDILOL 6.25 MG TABLET: 6.25 | 90 days supply | Qty: 180 | Fill #1

## 2020-05-22 MED FILL — TAMSULOSIN HCL 0.4 MG CAP: 0.4 | 90 days supply | Qty: 180 | Fill #1

## 2020-06-09 DIAGNOSIS — H40023 Open angle with borderline findings, high risk, bilateral: Secondary | ICD-10-CM | POA: Diagnosis not present

## 2020-06-09 DIAGNOSIS — H2589 Other age-related cataract: Secondary | ICD-10-CM | POA: Diagnosis not present

## 2020-06-11 ENCOUNTER — Ambulatory Visit: Payer: Medicare HMO | Attending: Internal Medicine | Admitting: Internal Medicine

## 2020-06-11 ENCOUNTER — Encounter: Payer: Self-pay | Admitting: Internal Medicine

## 2020-06-11 ENCOUNTER — Other Ambulatory Visit: Payer: Self-pay

## 2020-06-11 VITALS — BP 180/92 | HR 76 | Resp 16 | Wt 167.4 lb

## 2020-06-11 DIAGNOSIS — F17200 Nicotine dependence, unspecified, uncomplicated: Secondary | ICD-10-CM

## 2020-06-11 DIAGNOSIS — M2569 Stiffness of other specified joint, not elsewhere classified: Secondary | ICD-10-CM | POA: Diagnosis not present

## 2020-06-11 DIAGNOSIS — F17210 Nicotine dependence, cigarettes, uncomplicated: Secondary | ICD-10-CM

## 2020-06-11 DIAGNOSIS — Z5320 Procedure and treatment not carried out because of patient's decision for unspecified reasons: Secondary | ICD-10-CM | POA: Diagnosis not present

## 2020-06-11 DIAGNOSIS — I10 Essential (primary) hypertension: Secondary | ICD-10-CM | POA: Diagnosis not present

## 2020-06-11 DIAGNOSIS — Z8619 Personal history of other infectious and parasitic diseases: Secondary | ICD-10-CM | POA: Diagnosis not present

## 2020-06-11 NOTE — Patient Instructions (Addendum)
I recommend taking the lisinopril/HCTZ 2 tablets daily as prescribed.  Continue to check your blood pressure.  The goal is 130/80 or lower.  Please bring your readings with you on your next visit along with your home blood pressure monitoring device.   Back Exercises These exercises help to make your trunk and back strong. They also help to keep the lower back flexible. Doing these exercises can help to prevent back pain or lessen existing pain.  If you have back pain, try to do these exercises 2-3 times each day or as told by your doctor.  As you get better, do the exercises once each day. Repeat the exercises more often as told by your doctor.  To stop back pain from coming back, do the exercises once each day, or as told by your doctor. Exercises Single knee to chest Do these steps 3-5 times in a row for each leg: 1. Lie on your back on a firm bed or the floor with your legs stretched out. 2. Bring one knee to your chest. 3. Grab your knee or thigh with both hands and hold them it in place. 4. Pull on your knee until you feel a gentle stretch in your lower back or buttocks. 5. Keep doing the stretch for 10-30 seconds. 6. Slowly let go of your leg and straighten it. Pelvic tilt Do these steps 5-10 times in a row: 1. Lie on your back on a firm bed or the floor with your legs stretched out. 2. Bend your knees so they point up to the ceiling. Your feet should be flat on the floor. 3. Tighten your lower belly (abdomen) muscles to press your lower back against the floor. This will make your tailbone point up to the ceiling instead of pointing down to your feet or the floor. 4. Stay in this position for 5-10 seconds while you gently tighten your muscles and breathe evenly. Cat-cow Do these steps until your lower back bends more easily: 1. Get on your hands and knees on a firm surface. Keep your hands under your shoulders, and keep your knees under your hips. You may put padding under your  knees. 2. Let your head hang down toward your chest. Tighten (contract) the muscles in your belly. Point your tailbone toward the floor so your lower back becomes rounded like the back of a cat. 3. Stay in this position for 5 seconds. 4. Slowly lift your head. Let the muscles of your belly relax. Point your tailbone up toward the ceiling so your back forms a sagging arch like the back of a cow. 5. Stay in this position for 5 seconds.  Press-ups Do these steps 5-10 times in a row: 1. Lie on your belly (face-down) on the floor. 2. Place your hands near your head, about shoulder-width apart. 3. While you keep your back relaxed and keep your hips on the floor, slowly straighten your arms to raise the top half of your body and lift your shoulders. Do not use your back muscles. You may change where you place your hands in order to make yourself more comfortable. 4. Stay in this position for 5 seconds. 5. Slowly return to lying flat on the floor.  Bridges Do these steps 10 times in a row: 1. Lie on your back on a firm surface. 2. Bend your knees so they point up to the ceiling. Your feet should be flat on the floor. Your arms should be flat at your sides, next to your  body. 3. Tighten your butt muscles and lift your butt off the floor until your waist is almost as high as your knees. If you do not feel the muscles working in your butt and the back of your thighs, slide your feet 1-2 inches farther away from your butt. 4. Stay in this position for 3-5 seconds. 5. Slowly lower your butt to the floor, and let your butt muscles relax. If this exercise is too easy, try doing it with your arms crossed over your chest. Belly crunches Do these steps 5-10 times in a row: 1. Lie on your back on a firm bed or the floor with your legs stretched out. 2. Bend your knees so they point up to the ceiling. Your feet should be flat on the floor. 3. Cross your arms over your chest. 4. Tip your chin a little bit  toward your chest but do not bend your neck. 5. Tighten your belly muscles and slowly raise your chest just enough to lift your shoulder blades a tiny bit off of the floor. Avoid raising your body higher than that, because it can put too much stress on your low back. 6. Slowly lower your chest and your head to the floor. Back lifts Do these steps 5-10 times in a row: 1. Lie on your belly (face-down) with your arms at your sides, and rest your forehead on the floor. 2. Tighten the muscles in your legs and your butt. 3. Slowly lift your chest off of the floor while you keep your hips on the floor. Keep the back of your head in line with the curve in your back. Look at the floor while you do this. 4. Stay in this position for 3-5 seconds. 5. Slowly lower your chest and your face to the floor. Contact a doctor if:  Your back pain gets a lot worse when you do an exercise.  Your back pain does not get better 2 hours after you exercise. If you have any of these problems, stop doing the exercises. Do not do them again unless your doctor says it is okay. Get help right away if:  You have sudden, very bad back pain. If this happens, stop doing the exercises. Do not do them again unless your doctor says it is okay. This information is not intended to replace advice given to you by your health care provider. Make sure you discuss any questions you have with your health care provider. Document Revised: 08/09/2018 Document Reviewed: 08/09/2018 Elsevier Patient Education  2020 [employer].

## 2020-06-11 NOTE — Progress Notes (Signed)
Patient ID: James Avila, male    DOB: [birth year]  MRN: [id number]  CC: Hypertension   Subjective: James Avila is a 69 y.o. male who presents for chronic ds management His concerns today include:  Patient with history of HTN, BPH, ED, cirrhosisdue tohep C (cured - does not want surveillance ultrasound), vit D def, tob dep, insomnia,panic disorder  HTN:  C/o HA on last visit 02/2020.  We had continue lisinopril/HCTZ and had increase the carvedilol to 12.5 mg twice a day.  Since then patient reports that his blood pressure readings have been good and started running low.  He was having some dizziness with the low blood pressure.  So he stopped the carvedilol and decrease the lisinopril/HCTZ to 1 tablet daily instead of 2.  He has some blood pressure readings with him today: 118/72, 111/77, 129/74, 126/76 and 131/74 with pulse 60-70.  Uses a wrist cuff.  Plans to get new battery.  Does not have device with him today. -still uses salt -still smoking but states he is really trying to quit.  Has patches 21 mg which he uses consistently.  Did not set quit date as yet  C/o chronic pulled muscle in his lower back.  He feels it only if he bends over for a few minutes like standing at the sink to brush his teeth.  When he straightens up, he gets an aching in his lower back that lasts several minutes.  Wanting to know if there are some exercises that he can do to help with this.  HM:  Had [place] vaccine. Does not want colon cancer screening at all.    Taking Vit D 3 5000 QOD for previous history of vitamin D deficiency.  He is overdue for routine blood tests but does not want to have it done.  States that the last time he had it done he was charged $90 on the back and even with him having Medicare.  He also has history of hepatitis C with cirrhosis for which he was treated.  He does not want screening ultrasound for HCC. Patient Active Problem List   Diagnosis Date Noted  . Headache syndrome  03/19/2020  . Hepatitis C virus infection cured after antiviral drug therapy 02/05/2019  . Panic disorder 02/05/2019  . Nodule of shaft of penis 06/02/2017  . Seborrheic keratoses 05/02/2017  . Insomnia 04/06/2017  . Plantar fasciitis, left 03/06/2017  . Deltoid tendonitis, unspecified laterality 01/20/2017  . Impotence 04/11/2016  . Plantar wart of left foot 04/11/2016  . Vitamin D deficiency 04/08/2016  . Hepatic cirrhosis ([place]) 07/31/2014  . BPH (benign prostatic hyperplasia) 10/10/2013  . Anxiety state, unspecified 10/10/2013  . Chronic back pain 10/10/2013  . TOBACCO USE 09/07/2007  . Hepatitis C virus infection without hepatic coma 05/10/2007  . Essential hypertension 05/10/2007     Current Outpatient Medications on File Prior to Visit  Medication Sig Dispense Refill  . aspirin 81 MG tablet Take 1 tablet (81 mg total) by mouth daily. 90 tablet 3  . butalbital-acetaminophen-caffeine (FIORICET) 50-325-40 MG tablet Take 2 tablets by mouth daily as needed for headache. 20 tablet 0  . carvedilol (COREG) 12.5 MG tablet Take 1 tablet (12.5 mg total) by mouth 2 (two) times daily with a meal. 60 tablet 6  . Cholecalciferol (VITAMIN D3 PO) Take 5,000 mg by mouth daily.    Marland Kitchen lisinopril-hydrochlorothiazide (ZESTORETIC) 20-12.5 MG tablet Take 2 tablets by mouth daily. 180 tablet 0  . nicotine (NICODERM  CQ - DOSED IN MG/24 HOURS) 14 mg/24hr patch Place 1 patch (14 mg total) onto the skin daily. 28 patch 0  . nicotine (NICODERM CQ - DOSED IN MG/24 HOURS) 21 mg/24hr patch Place 1 patch (21 mg total) onto the skin daily. 28 patch 1  . ondansetron (ZOFRAN) 4 MG tablet Take 1 tablet (4 mg total) by mouth every 8 (eight) hours as needed for nausea or vomiting. (Patient not taking: Reported on 11/08/2019) 20 tablet 0  . sertraline (ZOLOFT) 50 MG tablet Take 1 tablet (50 mg total) by mouth daily. 90 tablet 0  . sildenafil (VIAGRA) 50 MG tablet Take 1 tablet half hour to 1 hour before sexual  intercourse. Limit to 1 tab/24 hrs 10 tablet 6  . tamsulosin (FLOMAX) 0.4 MG CAPS capsule Take 2 capsules (0.8 mg total) by mouth daily after supper. 180 capsule 0  . zolpidem (AMBIEN) 5 MG tablet Take 5 mg at bedtime as needed for sleep 30 tablet 0   No current facility-administered medications on file prior to visit.    Allergies  Allergen Reactions  . Hydralazine     Causes numbness Increased blood pressure    Social History   Socioeconomic History  . Marital status: Divorced    Spouse name: Not on file  . Number of children: Not on file  . Years of education: Not on file  . Highest education level: Not on file  Occupational History  . Not on file  Tobacco Use  . Smoking status: Current Every Day Smoker    Packs/day: 0.50    Types: Cigarettes  . Smokeless tobacco: Never Used  . Tobacco comment: Smoking 5-10 cigs/day  Vaping Use  . Vaping Use: Never used  Substance and Sexual Activity  . Alcohol use: Yes    Alcohol/week: 4.0 standard drinks    Types: 4 Cans of beer per week    Comment: weekly  . Drug use: No  . Sexual activity: Not on file  Other Topics Concern  . Not on file  Social History Narrative  . Not on file   Social Determinants of Health   Financial Resource Strain:   . Difficulty of Paying Living Expenses:   Food Insecurity:   . Worried About Charity fundraiser in the Last Year:   . Arboriculturist in the Last Year:   Transportation Needs:   . Film/video editor (Medical):   Marland Kitchen Lack of Transportation (Non-Medical):   Physical Activity:   . Days of Exercise per Week:   . Minutes of Exercise per Session:   Stress:   . Feeling of Stress :   Social Connections:   . Frequency of Communication with Friends and Family:   . Frequency of Social Gatherings with Friends and Family:   . Attends Religious Services:   . Active Member of Clubs or Organizations:   . Attends Archivist Meetings:   Marland Kitchen Marital Status:   Intimate Partner  Violence:   . Fear of Current or Ex-Partner:   . Emotionally Abused:   Marland Kitchen Physically Abused:   . Sexually Abused:     Family History  Problem Relation Age of Onset  . Hypertension Mother   . Hydrocephalus Mother   . Stroke Mother   . Diabetes Mother   . Cancer Father 19       skin     Past Surgical History:  Procedure Laterality Date  . TONSILLECTOMY      ROS: Review  of Systems Negative except as stated above  PHYSICAL EXAM: BP (!) 180/92   Pulse 76   Resp 16   Wt 167 lb 6.4 oz (75.9 kg)   SpO2 96%   BMI 23.68 kg/m   Physical Exam  General appearance - alert, well appearing, and in no distress Mental status - normal mood, behavior, speech, dress, motor activity, and thought processes Eyes - pupils equal and reactive, extraocular eye movements intact Neck - supple, no significant adenopathy Chest - clear to auscultation, no wheezes, rales or rhonchi, symmetric air entry Heart - normal rate, regular rhythm, normal S1, S2, no murmurs, rubs, clicks or gallops Extremities - peripheral pulses normal, no pedal edema, no clubbing or cyanosis  CMP Latest Ref Rng & Units 05/10/2018 01/20/2017 02/12/2015  Glucose 65 - 99 mg/dL 113(H) 98 99  BUN 8 - 27 mg/dL 17 16 14   Creatinine 0.76 - 1.27 mg/dL 1.00 0.89 0.89  Sodium 134 - 144 mmol/L 140 139 138  Potassium 3.5 - 5.2 mmol/L 3.6 3.7 3.8  Chloride 96 - 106 mmol/L 99 102 102  CO2 20 - 29 mmol/L 26 27 27   Calcium 8.6 - 10.2 mg/dL 9.2 9.2 8.9  Total Protein 6.0 - 8.5 g/dL 7.0 7.2 7.5  Total Bilirubin 0.0 - 1.2 mg/dL 0.6 0.8 0.7  Alkaline Phos 39 - 117 IU/L 60 50 48  AST 0 - 40 IU/L 13 15 18   ALT 0 - 44 IU/L 10 15 17    Lipid Panel     Component Value Date/Time   CHOL 179 10/08/2014 0912   TRIG 141 10/08/2014 0912   HDL 36 (L) 10/08/2014 0912   CHOLHDL 5.0 10/08/2014 0912   VLDL 28 10/08/2014 0912   LDLCALC 115 (H) 10/08/2014 0912    CBC    Component Value Date/Time   WBC 7.6 05/10/2018 1140   WBC 7.6 01/20/2017  1216   RBC 4.31 05/10/2018 1140   RBC 4.73 01/20/2017 1216   HGB 13.2 05/10/2018 1140   HCT 37.7 05/10/2018 1140   PLT 227 05/10/2018 1140   MCV 88 05/10/2018 1140   MCH 30.6 05/10/2018 1140   MCH 30.7 01/20/2017 1216   MCHC 35.0 05/10/2018 1140   MCHC 34.2 01/20/2017 1216   RDW 13.1 05/10/2018 1140   LYMPHSABS 3.1 05/10/2018 1140   MONOABS 0.6 04/30/2014 1500   EOSABS 0.4 05/10/2018 1140   BASOSABS 0.0 05/10/2018 1140    ASSESSMENT AND PLAN: 1. Essential hypertension Not at goal. Recommend that patient bring his blood pressure device with him on next visit so that we can check his blood pressure with device in his. Recommend that he takes lisinopril/HCTZ as 2 tablets daily as prescribed.  If after a few days, his blood pressure is still higher than 130/80, he will restart carvedilol at 6.25 twice a day before trying to increase it back to the 12.5 mg twice a day.  2. Tobacco dependence Advised to quit.  He is aware of health risks associated with smoking.  Encouraged him to continue using the nicotine patches and to set a quit date.  Less than 5 minutes spent on counseling.  3. Hepatitis C virus infection cured after antiviral drug therapy Patient declines [place] screening with ultrasound  4. Colon cancer screening declined Discussed colon cancer screening.  He does not want to have colonoscopy.  I have discussed other methods of screening including a Cologuard fit test but patient still declines.  5. Blood test declined Patient declines routine blood  tests  6. Back stiffness I told him that I would print some information on back exercises for him but I forgot to included on his discharge summary.  I will have my medical assistant mail the updated  discharge summary with this information.  Patient was given the opportunity to ask questions.  Patient verbalized understanding of the plan and was able to repeat key elements of the plan.   No orders of the defined types were placed  in this encounter.    Requested Prescriptions    No prescriptions requested or ordered in this encounter    Return in about 1 month (around 07/12/2020) for Medicare Wellness Visit and recheck BP.  Karle Plumber, MD, FACP

## 2020-07-17 ENCOUNTER — Other Ambulatory Visit: Payer: Self-pay | Admitting: Internal Medicine

## 2020-07-17 ENCOUNTER — Other Ambulatory Visit: Payer: Self-pay

## 2020-07-17 ENCOUNTER — Encounter: Payer: Self-pay | Admitting: Internal Medicine

## 2020-07-17 ENCOUNTER — Ambulatory Visit (HOSPITAL_BASED_OUTPATIENT_CLINIC_OR_DEPARTMENT_OTHER): Payer: Medicare HMO | Admitting: Pharmacist

## 2020-07-17 ENCOUNTER — Ambulatory Visit: Payer: Medicare HMO | Attending: Internal Medicine | Admitting: Internal Medicine

## 2020-07-17 VITALS — BP 132/80 | HR 70 | Temp 98.3°F | Resp 16 | Ht 70.0 in | Wt 171.2 lb

## 2020-07-17 DIAGNOSIS — Z01118 Encounter for examination of ears and hearing with other abnormal findings: Secondary | ICD-10-CM

## 2020-07-17 DIAGNOSIS — Z23 Encounter for immunization: Secondary | ICD-10-CM

## 2020-07-17 DIAGNOSIS — F5104 Psychophysiologic insomnia: Secondary | ICD-10-CM | POA: Diagnosis not present

## 2020-07-17 DIAGNOSIS — Z5320 Procedure and treatment not carried out because of patient's decision for unspecified reasons: Secondary | ICD-10-CM

## 2020-07-17 DIAGNOSIS — Z1331 Encounter for screening for depression: Secondary | ICD-10-CM | POA: Diagnosis not present

## 2020-07-17 DIAGNOSIS — F17200 Nicotine dependence, unspecified, uncomplicated: Secondary | ICD-10-CM

## 2020-07-17 DIAGNOSIS — I10 Essential (primary) hypertension: Secondary | ICD-10-CM | POA: Diagnosis not present

## 2020-07-17 DIAGNOSIS — Z0000 Encounter for general adult medical examination without abnormal findings: Secondary | ICD-10-CM | POA: Diagnosis not present

## 2020-07-17 DIAGNOSIS — N401 Enlarged prostate with lower urinary tract symptoms: Secondary | ICD-10-CM | POA: Diagnosis not present

## 2020-07-17 DIAGNOSIS — N3943 Post-void dribbling: Secondary | ICD-10-CM

## 2020-07-17 DIAGNOSIS — R94128 Abnormal results of other function studies of ear and other special senses: Secondary | ICD-10-CM

## 2020-07-17 MED ORDER — ZOLPIDEM TARTRATE 5 MG PO TABS: ORAL_TABLET | ORAL | 0 refills | Status: DC

## 2020-07-17 MED ORDER — CARVEDILOL 6.25 MG PO TABS: 6.2500 mg | ORAL_TABLET | Freq: Two times a day (BID) | ORAL | 3 refills | Status: DC

## 2020-07-17 MED ORDER — TAMSULOSIN HCL 0.4 MG PO CAPS: 0.8000 mg | ORAL_CAPSULE | Freq: Every day | ORAL | 0 refills | Status: DC

## 2020-07-17 MED ORDER — LISINOPRIL-HYDROCHLOROTHIAZIDE 20-12.5 MG PO TABS: 1.0000 | ORAL_TABLET | Freq: Two times a day (BID) | ORAL | 3 refills | Status: DC

## 2020-07-17 MED FILL — LISINOPRIL-HCTZ 20-12.5 MG: 20-12.5 | 90 days supply | Qty: 180 | Fill #0

## 2020-07-17 NOTE — Patient Instructions (Addendum)
Please read the packet that we gave you on advanced directive and consider executing a living will or healthcare power of attorney.  Continue to work on trying to quit smoking.    Influenza Virus Vaccine injection (Fluarix) What is this medicine? INFLUENZA VIRUS VACCINE (in floo EN zuh VAHY ruhs vak SEEN) helps to reduce the risk of getting influenza also known as the flu. This medicine may be used for other purposes; ask your health care provider or pharmacist if you have questions. COMMON BRAND NAME(S): Fluarix, Fluzone What should I tell my health care provider before I take this medicine? They need to know if you have any of these conditions:  bleeding disorder like hemophilia  fever or infection  Guillain-Barre syndrome or other neurological problems  immune system problems  infection with the human immunodeficiency virus (HIV) or AIDS  low blood platelet counts  multiple sclerosis  an unusual or allergic reaction to influenza virus vaccine, eggs, chicken proteins, latex, gentamicin, other medicines, foods, dyes or preservatives  pregnant or trying to get pregnant  breast-feeding How should I use this medicine? This vaccine is for injection into a muscle. It is given by a health care professional. A copy of Vaccine Information Statements will be given before each vaccination. Read this sheet carefully each time. The sheet may change frequently. Talk to your pediatrician regarding the use of this medicine in children. Special care may be needed. Overdosage: If you think you have taken too much of this medicine contact a poison control center or emergency room at once. NOTE: This medicine is only for you. Do not share this medicine with others. What if I miss a dose? This does not apply. What may interact with this medicine?  chemotherapy or radiation therapy  medicines that lower your immune system like etanercept, anakinra, infliximab, and adalimumab  medicines  that treat or prevent blood clots like warfarin  phenytoin  steroid medicines like prednisone or cortisone  theophylline  vaccines This list may not describe all possible interactions. Give your health care provider a list of all the medicines, herbs, non-prescription drugs, or dietary supplements you use. Also tell them if you smoke, drink alcohol, or use illegal drugs. Some items may interact with your medicine. What should I watch for while using this medicine? Report any side effects that do not go away within 3 days to your doctor or health care professional. Call your health care provider if any unusual symptoms occur within 6 weeks of receiving this vaccine. You may still catch the flu, but the illness is not usually as bad. You cannot get the flu from the vaccine. The vaccine will not protect against colds or other illnesses that may cause fever. The vaccine is needed every year. What side effects may I notice from receiving this medicine? Side effects that you should report to your doctor or health care professional as soon as possible:  allergic reactions like skin rash, itching or hives, swelling of the face, lips, or tongue Side effects that usually do not require medical attention (report to your doctor or health care professional if they continue or are bothersome):  fever  headache  muscle aches and pains  pain, tenderness, redness, or swelling at site where injected  weak or tired This list may not describe all possible side effects. Call your doctor for medical advice about side effects. You may report side effects to FDA at 1-800-FDA-1088. Where should I keep my medicine? This vaccine is only given in  a clinic, pharmacy, doctor's office, or other health care setting and will not be stored at home. NOTE: This sheet is a summary. It may not cover all possible information. If you have questions about this medicine, talk to your doctor, pharmacist, or health care  provider.  2020 Elsevier/Gold Standard (2008-06-11 09:30:40)

## 2020-07-17 NOTE — Progress Notes (Signed)
Subjective:   James Avila is a 69 y.o. male who presents for an Initial Medicare Annual Wellness Visit. Patient with history of HTN, BPH,ED,cirrhosisdue tohep C (cured- does not wantsurveillance ultrasound), vit D def, tob dep, insomnia,panic disorder  Review of Systems    BP eleved on last visit.  We had him resume taking lisinopril and Coreg.  He tells me that he has been taking the lisinopril 20/12.5 twice a day and Coreg 6.25 mg twice a day.  Brings BP readings with him.  He takes his medicines at 2 AM in the mornings and 2 p.m in the afternoons.  Blood pressures in the early mornings are running good.  Some of those readings are 119/88, 112/71.  Blood pressure checks at 2 PM before he takes his afternoon dose of medications are elevated.  Some of those readings are 159/110, 155/98, 169/109.  However he then checks the blood pressure several hours later and the readings are good. Limits salt.       Objective:    Today's Vitals   07/17/20 1503 07/17/20 1611  BP: (!) 158/91 132/80  Pulse: 70   Resp: 16   Temp: 98.3 F (36.8 C)   SpO2: 95%   Weight: 171 lb 3.2 oz (77.7 kg)   Height: 5\' 10"  (1.778 m)    Body mass index is 24.56 kg/m. Repeat blood pressure today was 132/80. Patient had his wrist device with him and rechecked blood pressure on that device also.  Reading was 171/117. Both ear canal and tympanic membranes were within normal limits. Advanced Directives 07/17/2020 07/03/2017 06/02/2017 05/02/2017 04/04/2017 03/06/2017 01/20/2017  Does Patient Have a Medical Advance Directive? No No No No No No No  Type of Advance Directive - - - - - - -  Would patient like information on creating a medical advance directive? Yes (Inpatient - patient defers creating a medical advance directive at this time - Information given) - - - - - -  Patient given packet of information on advanced directive and how to execute a living will and or healthcare power of attorney.  Current  Medications (verified) Outpatient Encounter Medications as of 07/17/2020  Medication Sig  . aspirin 81 MG tablet Take 1 tablet (81 mg total) by mouth daily.  . butalbital-acetaminophen-caffeine (FIORICET) 50-325-40 MG tablet Take 2 tablets by mouth daily as needed for headache.  . carvedilol (COREG) 6.25 MG tablet Take 1 tablet (6.25 mg total) by mouth 2 (two) times daily with a meal.  . Cholecalciferol (VITAMIN D3 PO) Take 5,000 mg by mouth daily.  Marland Kitchen lisinopril-hydrochlorothiazide (ZESTORETIC) 20-12.5 MG tablet Take 1 tablet by mouth 2 (two) times daily.  . nicotine (NICODERM CQ - DOSED IN MG/24 HOURS) 14 mg/24hr patch Place 1 patch (14 mg total) onto the skin daily.  . nicotine (NICODERM CQ - DOSED IN MG/24 HOURS) 21 mg/24hr patch Place 1 patch (21 mg total) onto the skin daily.  . sildenafil (VIAGRA) 50 MG tablet Take 1 tablet half hour to 1 hour before sexual intercourse. Limit to 1 tab/24 hrs  . tamsulosin (FLOMAX) 0.4 MG CAPS capsule Take 2 capsules (0.8 mg total) by mouth daily after supper.  . zolpidem (AMBIEN) 5 MG tablet Take 5 mg at bedtime as needed for sleep  . [DISCONTINUED] carvedilol (COREG) 12.5 MG tablet Take 1 tablet (12.5 mg total) by mouth 2 (two) times daily with a meal.  . [DISCONTINUED] lisinopril-hydrochlorothiazide (ZESTORETIC) 20-12.5 MG tablet Take 2 tablets by mouth daily.  . [  DISCONTINUED] ondansetron (ZOFRAN) 4 MG tablet Take 1 tablet (4 mg total) by mouth every 8 (eight) hours as needed for nausea or vomiting. (Patient not taking: Reported on 11/08/2019)  . [DISCONTINUED] sertraline (ZOLOFT) 50 MG tablet Take 1 tablet (50 mg total) by mouth daily.  . [DISCONTINUED] tamsulosin (FLOMAX) 0.4 MG CAPS capsule Take 2 capsules (0.8 mg total) by mouth daily after supper.  . [DISCONTINUED] zolpidem (AMBIEN) 5 MG tablet Take 5 mg at bedtime as needed for sleep   No facility-administered encounter medications on file as of 07/17/2020.    Allergies (verified) Hydralazine    History: Past Medical History:  Diagnosis Date  . Anxiety state, unspecified 10/10/2013  . BPH (benign prostatic hyperplasia) 10/10/2013  . HEPATITIS C 05/10/2007   Qualifier: Diagnosis of  By: Leanne Chang MD, Bruce    . Hypertension   . TOBACCO USE 09/07/2007   Qualifier: Diagnosis of  By: Leanne Chang MD, Bruce     Past Surgical History:  Procedure Laterality Date  . TONSILLECTOMY     Family History  Problem Relation Age of Onset  . Hypertension Mother   . Hydrocephalus Mother   . Stroke Mother   . Diabetes Mother   . Cancer Father 40       skin    Social History   Socioeconomic History  . Marital status: Divorced    Spouse name: Not on file  . Number of children: Not on file  . Years of education: Not on file  . Highest education level: Not on file  Occupational History  . Not on file  Tobacco Use  . Smoking status: Current Every Day Smoker    Packs/day: 0.50    Types: Cigarettes  . Smokeless tobacco: Never Used  . Tobacco comment: Smoking 5-10 cigs/day  Vaping Use  . Vaping Use: Never used  Substance and Sexual Activity  . Alcohol use: Yes    Alcohol/week: 4.0 standard drinks    Types: 4 Cans of beer per week    Comment: weekly  . Drug use: No  . Sexual activity: Not on file  Other Topics Concern  . Not on file  Social History Narrative  . Not on file   Social Determinants of Health   Financial Resource Strain:   . Difficulty of Paying Living Expenses: Not on file  Food Insecurity:   . Worried About Charity fundraiser in the Last Year: Not on file  . Ran Out of Food in the Last Year: Not on file  Transportation Needs:   . Lack of Transportation (Medical): Not on file  . Lack of Transportation (Non-Medical): Not on file  Physical Activity:   . Days of Exercise per Week: Not on file  . Minutes of Exercise per Session: Not on file  Stress:   . Feeling of Stress : Not on file  Social Connections:   . Frequency of Communication with Friends and Family:  Not on file  . Frequency of Social Gatherings with Friends and Family: Not on file  . Attends Religious Services: Not on file  . Active Member of Clubs or Organizations: Not on file  . Attends Archivist Meetings: Not on file  . Marital Status: Not on file    Tobacco Counseling Patient is trying to quit. Currently smokes 5 to 10 cigarettes a day. Has been using patches for past 4 days. Quit date is set to 11/28/2020 Smoked since age 28 and during most of those years he was smoking  about a pack a day.  Quit for 3 yrs one time.  Clinical Intake: Pain : No/denies pain     Diabetes: No   Activities of Daily Living In your present state of health, do you have any difficulty performing the following activities: 07/17/2020  Hearing? Y  Vision? Y  Difficulty concentrating or making decisions? Y  Walking or climbing stairs? Y  Dressing or bathing? N  Doing errands, shopping? N  Preparing Food and eating ? N  Using the Toilet? Y  In the past six months, have you accidently leaked urine? N  Do you have problems with loss of bowel control? N  Comment stool are hard  Managing your Medications? N  Managing your Finances? N  Housekeeping or managing your Housekeeping? N  Some recent data might be hidden  Has problems hearing at times.  Declines referal Vision - had eye exam 1 mth ago.  Given rxn for eye glasses.  Has not filled rxn as yet.  Wears readers. Thinks he may have ADHD.  "All my life I have problems focusing."  Never able to watch a movie all the way through.  He is not interested in referral to behavioral health Using Toilet: Has no problems getting on and off of the toilet.    Patient Care Team: Ladell Pier, MD as PCP - General (Internal Medicine) Eye Doctor: Clarnce Flock an eye doctor recently but does not recall the name of the doctor or the name of the practice. Indicate any recent Medical Services you may have received from other than [facility] providers in the past  year (date may be approximate).     Assessment:   This is a routine wellness examination for James Avila.  Hearing/Vision screen  Hearing Screening   125Hz  250Hz  500Hz  1000Hz  2000Hz  3000Hz  4000Hz  6000Hz  8000Hz   Right ear:   nl nl nl  nl    Left ear:   nl nl abn  abn      Visual Acuity Screening   Right eye Left eye Both eyes  Without correction:     With correction: 20 30 20 25 20 20     Dietary issues and exercise activities discussed:    Goals   None    Depression Screen PHQ 2/9 Scores 07/17/2020 06/11/2020 08/28/2019 02/05/2019 05/10/2018 07/03/2017 06/02/2017  PHQ - 2 Score 2 3 0 4 2 2 2   PHQ- 9 Score 10 10 0 11 16 7 5    "I've had issues with depression my whole life off and on."  Learn to live through it.  He weaned himself off Zoloft.  Does not feel he needs it any more.  No recent panic attacks.  Fall Risk Fall Risk  07/17/2020 06/11/2020 08/28/2019 05/10/2018 07/03/2017  Falls in the past year? 0 1 0 Yes No  Number falls in past yr: 0 0 0 1 -  Injury with Fall? 0 0 0 Yes -  Follow up - - Falls evaluation completed Falls evaluation completed -    Any stairs in or around the home? Yes  If so, are there any without handrails? Yes  Home free of loose throw rugs in walkways, pet beds, electrical cords, etc? Yes  Adequate lighting in your home to reduce risk of falls? Yes   ASSISTIVE DEVICES UTILIZED TO PREVENT FALLS:  Life alert? No  Use of a cane, walker or w/c? No  Grab bars in the bathroom? Yes  Shower chair or bench in shower? No  Elevated toilet seat or  a handicapped toilet? No   TIMED UP AND GO:  Was the test performed? Yes .  Length of time to ambulate 10 feet: 7 sec.   Gait steady and fast without use of assistive device  Cognitive Function: MMSE - Mini Mental State Exam 07/17/2020  Orientation to time 5  Orientation to Place 5  Registration 3  Attention/ Calculation 5  Recall 3  Language- name 2 objects 2  Language- repeat 1  Language- follow 3 step command 3   Language- read & follow direction 1  Write a sentence 1  Copy design 1  Total score 30        Immunizations Immunization History  Administered Date(s) Administered  . Hepatitis A, Adult 07/31/2014  . Influenza,inj,Quad PF,6+ Mos 11/12/2019, 07/17/2020  . PFIZER SARS-COV-2 Vaccination 01/30/2020, 02/20/2020  . Pneumococcal Conjugate-13 11/12/2019  . Pneumococcal Polysaccharide-23 07/31/2014  . Tdap 08/05/2015    TDAP status: Up to date Flu Vaccine status: Completed at today's visit Pneumococcal vaccine status: Up to date Covid-19 vaccine status: Completed vaccines  Qualifies for Shingles Vaccine? Yes   Zostavax completed No   Shingrix Completed?: No.    Education has been provided regarding the importance of this vaccine. Patient has been advised to call insurance company to determine out of pocket expense if they have not yet received this vaccine. Advised may also receive vaccine at local pharmacy or Health Dept. Verbalized acceptance and understanding.  Screening Tests Health Maintenance  Topic Date Due  . COLONOSCOPY  Never done  . PNA vac Low Risk Adult (2 of 2 - PPSV23) 11/11/2020  . TETANUS/TDAP  08/04/2025  . INFLUENZA VACCINE  Completed  . COVID-19 Vaccine  Completed  . Hepatitis C Screening  Completed    Health Maintenance  Health Maintenance Due  Topic Date Due  . COLONOSCOPY  Never done   Declines all types of colon cancer screens.  Lung Cancer Screening: (Low Dose CT Chest recommended if Age 32-80 years, 30 pack-year currently smoking OR have quit w/in 15years.) does qualify.   Lung Cancer Screening Referral:  No.  Pt declines  Additional Screening:  Hepatitis C Screening: does qualify; Completed 04/08/2016  Vision Screening: Recommended annual ophthalmology exams for early detection of glaucoma and other disorders of the eye. Is the patient up to date with their annual eye exam?  Yes  Who is the provider or what is the name of the office in which  the patient attends annual eye exams? He does not recall the name If pt is not established with a provider, would they like to be referred to a provider to establish care? NA.   Dental Screening: Recommended annual dental exams for proper oral hygiene.  Has dentures.   Community Resource Referral / Chronic Care Management: CRR required this visit?  No   CCM required this visit?  No      Plan:    1. Encounter for Medicare annual wellness exam   2. Essential hypertension Continue current dose of carvedilol and lisinopril/HCTZ.  Patient plans to get an automated blood pressure device with an arm cuff.  He declines follow-up visit with our clinical pharmacist in 2 weeks for repeat blood pressure check. - carvedilol (COREG) 6.25 MG tablet; Take 1 tablet (6.25 mg total) by mouth 2 (two) times daily with a meal.  Dispense: 180 tablet; Refill: 3 - lisinopril-hydrochlorothiazide (ZESTORETIC) 20-12.5 MG tablet; Take 1 tablet by mouth 2 (two) times daily.  Dispense: 180 tablet; Refill: 3  3. Tobacco  dependence Commended him on trying to quit.  He has set a quit date for the first of 2022.  He will continue to use the nicotine patches to help decrease cravings.  He declines lung cancer screening.  4. Need for influenza vaccination Given today  5. Colon cancer screening declined Patient declines all methods of colon cancer screening.  We have discussed the reasoning behind screening but patient still declines.  6. Lung cancer screening declined by patient  7. Positive depression screening Patient does not feel that this is a major issue for him at this time and does not feel that he needs to be back on Zoloft  8. Abnormal hearing test Declines referral for formal hearing evaluation  9. Benign prostatic hyperplasia with post-void dribbling Refill given on Flomax - tamsulosin (FLOMAX) 0.4 MG CAPS capsule; Take 2 capsules (0.8 mg total) by mouth daily after supper.  Dispense: 180 capsule;  Refill: 0  10. Chronic insomnia Refill given on Ambien - zolpidem (AMBIEN) 5 MG tablet; Take 5 mg at bedtime as needed for sleep  Dispense: 30 tablet; Refill: 0   I have personally reviewed and noted the following in the patient's chart:   . Medical and social history . Use of alcohol, tobacco or illicit drugs  . Current medications and supplements . Functional ability and status . Nutritional status . Physical activity . Advanced directives . List of other physicians . Hospitalizations, surgeries, and ER visits in previous 12 months . Vitals . Screenings to include cognitive, depression, and falls . Referrals and appointments  In addition, I have reviewed and discussed with patient certain preventive protocols, quality metrics, and best practice recommendations. A written personalized care plan for preventive services as well as general preventive health recommendations were provided to patient.     Karle Plumber, MD   07/17/2020

## 2020-07-17 NOTE — Progress Notes (Signed)
Patient presents for vaccination against influenza per orders of Dr. Johnson. Consent given. Counseling provided. No contraindications exists. Vaccine administered without incident.   

## 2020-09-10 MED FILL — CARVEDILOL 6.25 MG TABLET: 6.25 | 90 days supply | Qty: 180 | Fill #0

## 2020-09-10 MED FILL — TAMSULOSIN HCL 0.4 MG CAP: 0.4 | 90 days supply | Qty: 180 | Fill #0

## 2020-11-16 ENCOUNTER — Ambulatory Visit: Payer: Medicare HMO | Attending: Internal Medicine | Admitting: Internal Medicine

## 2020-11-16 ENCOUNTER — Other Ambulatory Visit: Payer: Self-pay

## 2020-11-16 DIAGNOSIS — G4489 Other headache syndrome: Secondary | ICD-10-CM

## 2020-11-16 DIAGNOSIS — F17200 Nicotine dependence, unspecified, uncomplicated: Secondary | ICD-10-CM

## 2020-11-16 DIAGNOSIS — F5104 Psychophysiologic insomnia: Secondary | ICD-10-CM | POA: Diagnosis not present

## 2020-11-16 DIAGNOSIS — N529 Male erectile dysfunction, unspecified: Secondary | ICD-10-CM | POA: Diagnosis not present

## 2020-11-16 DIAGNOSIS — I10 Essential (primary) hypertension: Secondary | ICD-10-CM | POA: Diagnosis not present

## 2020-11-16 NOTE — Progress Notes (Signed)
Virtual Visit via Telephone Note  I connected with James Avila on 11/16/20 at 5:18 p.m by telephone and verified that I am speaking with the correct person using two identifiers.  Location: Patient: home Provider: office   I discussed the limitations, risks, security and privacy concerns of performing an evaluation and management service by telephone and the availability of in person appointments. I also discussed with the patient that there may be a patient responsible charge related to this service. The patient expressed understanding and agreed to proceed.   History of Present Illness: Patient with history of HTN, BPH,ED,cirrhosisdue tohep C (cured- does not wantsurveillance ultrasound), vit D def, tob dep, insomnia,panic disorder.  This is chronic ds management.  Pt reports having some eyeball headache from time to time. Given Fioricet earlier in the yr for same and reports it works very well.  Has only a few tablets left and would like a refill to keep on hand to use as needed.  Also request RF on Ambien.  Out x 3 wks.  Doing okay on the medication without significant side effects.  Denies any daytime drowsiness when he takes the medicine.  HTN: doing better.  Checks 2 x a wk.  Gives 118/80 and varies a little. Reports compliance with meds "lisinopril/hydrochlorothiazide and carvedilol.  Salt is his vice No CP/SOB/LE edema  Tob dep:  Has cut down but not there yet. Friend told him he took a med that made cigarettes not taste good.  Wants to know if he can be prescribed that med.  Still has the patches but not using consistently  ED:  Needs RF on Viagra  Outpatient Encounter Medications as of 11/16/2020  Medication Sig  . aspirin 81 MG tablet Take 1 tablet (81 mg total) by mouth daily.  . butalbital-acetaminophen-caffeine (FIORICET) 50-325-40 MG tablet Take 2 tablets by mouth daily as needed for headache.  . carvedilol (COREG) 6.25 MG tablet Take 1 tablet (6.25 mg total) by  mouth 2 (two) times daily with a meal.  . Cholecalciferol (VITAMIN D3 PO) Take 5,000 mg by mouth daily.  Marland Kitchen lisinopril-hydrochlorothiazide (ZESTORETIC) 20-12.5 MG tablet Take 1 tablet by mouth 2 (two) times daily.  . nicotine (NICODERM CQ - DOSED IN MG/24 HOURS) 14 mg/24hr patch Place 1 patch (14 mg total) onto the skin daily.  . nicotine (NICODERM CQ - DOSED IN MG/24 HOURS) 21 mg/24hr patch Place 1 patch (21 mg total) onto the skin daily.  . sildenafil (VIAGRA) 50 MG tablet Take 1 tablet half hour to 1 hour before sexual intercourse. Limit to 1 tab/24 hrs  . tamsulosin (FLOMAX) 0.4 MG CAPS capsule Take 2 capsules (0.8 mg total) by mouth daily after supper.  . zolpidem (AMBIEN) 5 MG tablet Take 5 mg at bedtime as needed for sleep   No facility-administered encounter medications on file as of 11/16/2020.    Observations/Objective: No direct observation done as this was a telephone encounter.  Assessment and Plan: 1. Essential hypertension Reported home blood pressure readings are a lot better.  He will continue the lisinopril/hydrochlorothiazide and low-salt diet  2. Tobacco dependence Advised to quit.  I told him that we do not have Chantix currently available.  If he is interested in quitting I would encourage him to use the nicotine patches consistently.  Less than 5 minutes spent on counseling.  3. Erectile dysfunction, unspecified erectile dysfunction type - sildenafil (VIAGRA) 50 MG tablet; Take 1 tablet half hour to 1 hour before sexual intercourse. Limit to  1 tab/24 hrs  Dispense: 10 tablet; Refill: 6  4. Chronic insomnia Refill given on Ambien.  [place] controlled substance reporting system reviewed.  Patient denies any significant side effects from the medication and is benefiting from it.  Continue to encourage good sleep hygiene. - zolpidem (AMBIEN) 5 MG tablet; Take 5 mg at bedtime as needed for sleep  Dispense: 30 tablet; Refill: 0  5. Headache syndrome -  butalbital-acetaminophen-caffeine (FIORICET) 50-325-40 MG tablet; Take 2 tablets by mouth daily as needed for headache.  Dispense: 20 tablet; Refill: 0   Follow Up Instructions: 4 months.   I discussed the assessment and treatment plan with the patient. The patient was provided an opportunity to ask questions and all were answered. The patient agreed with the plan and demonstrated an understanding of the instructions.   The patient was advised to call back or seek an in-person evaluation if the symptoms worsen or if the condition fails to improve as anticipated.  I provided 9 minutes of non-face-to-face time during this encounter.   Karle Plumber, MD

## 2020-11-16 NOTE — Progress Notes (Signed)
Pt states his Ambien and Viagra goes to [facility] and [employer] goes the [facility]   Pt states he wanting to speak with pcp about a pill he can take to help him stop smoking

## 2020-11-17 ENCOUNTER — Other Ambulatory Visit: Payer: Self-pay | Admitting: Internal Medicine

## 2020-11-17 ENCOUNTER — Encounter: Payer: Self-pay | Admitting: Internal Medicine

## 2020-11-17 MED ORDER — SILDENAFIL CITRATE 50 MG PO TABS: ORAL_TABLET | ORAL | 6 refills | Status: DC

## 2020-11-17 MED ORDER — BUTALBITAL-APAP-CAFFEINE 50-325-40 MG PO TABS: 2.0000 | ORAL_TABLET | Freq: Every day | ORAL | 0 refills | Status: DC | PRN

## 2020-11-17 MED ORDER — ZOLPIDEM TARTRATE 5 MG PO TABS: ORAL_TABLET | ORAL | 0 refills | Status: DC

## 2020-11-17 MED FILL — BUTALB-ACETAMIN-CAFF 50-325: 50-325-40 | 10 days supply | Qty: 20 | Fill #0

## 2020-12-08 DIAGNOSIS — Z1152 Encounter for screening for COVID-19: Secondary | ICD-10-CM | POA: Diagnosis not present

## 2020-12-22 ENCOUNTER — Other Ambulatory Visit: Payer: Self-pay | Admitting: Internal Medicine

## 2020-12-22 DIAGNOSIS — N3943 Post-void dribbling: Secondary | ICD-10-CM

## 2020-12-22 MED FILL — TAMSULOSIN HCL 0.4 MG CAP: 0.4 | 90 days supply | Qty: 180 | Fill #0

## 2020-12-22 MED FILL — CARVEDILOL 6.25 MG TABLET: 6.25 | 90 days supply | Qty: 180 | Fill #1

## 2020-12-22 MED FILL — LISINOPRIL-HCTZ 20-12.5 MG: 20-12.5 | 90 days supply | Qty: 180 | Fill #1

## 2020-12-29 MED FILL — LISINOPRIL-HCTZ 20-12.5 MG: 20-12.5 | 90 days supply | Qty: 180 | Fill #1

## 2020-12-29 MED FILL — CARVEDILOL 6.25 MG TABLET: 6.25 | 90 days supply | Qty: 180 | Fill #1

## 2020-12-29 MED FILL — TAMSULOSIN HCL 0.4 MG CAP: 0.4 | 90 days supply | Qty: 180 | Fill #0

## 2021-02-19 MED FILL — CARVEDILOL 6.25 MG TABLET: 6.25 | 90 days supply | Qty: 180 | Fill #0

## 2021-02-19 MED FILL — TAMSULOSIN HCL 0.4 MG CAP: 0.4 | 90 days supply | Qty: 180 | Fill #0

## 2021-02-19 MED FILL — LISINOPRIL-HCTZ 20-12.5 MG: 20-12.5 | 90 days supply | Qty: 180 | Fill #0

## 2021-04-08 ENCOUNTER — Telehealth: Payer: Self-pay | Admitting: Internal Medicine

## 2021-04-08 DIAGNOSIS — N529 Male erectile dysfunction, unspecified: Secondary | ICD-10-CM

## 2021-04-08 DIAGNOSIS — I10 Essential (primary) hypertension: Secondary | ICD-10-CM

## 2021-04-08 NOTE — Telephone Encounter (Signed)
Copied from [place] [phone]. Topic: General - Other >> Apr 06, 2021  5:57 PM Erick Blinks wrote: Reason for CRM:   Hetty Blend, calling from [employer], is requesting a response to the fax submission of 5 meds sent on 03/30/2021. Please advise   Best contact: [phone]

## 2021-04-09 MED ORDER — SILDENAFIL CITRATE 50 MG PO TABS: ORAL_TABLET | ORAL | 0 refills | Status: DC

## 2021-04-09 MED ORDER — LISINOPRIL-HYDROCHLOROTHIAZIDE 20-12.5 MG PO TABS: 1.0000 | ORAL_TABLET | Freq: Two times a day (BID) | ORAL | 0 refills | Status: DC

## 2021-04-09 MED ORDER — CARVEDILOL 6.25 MG PO TABS: 6.2500 mg | ORAL_TABLET | Freq: Two times a day (BID) | ORAL | 0 refills | Status: DC

## 2021-04-09 MED ORDER — TAMSULOSIN HCL 0.4 MG PO CAPS
ORAL_CAPSULE | ORAL | 0 refills | Status: DC
Start: 2021-04-09 — End: 2021-09-01

## 2021-04-09 NOTE — Telephone Encounter (Signed)
Haven't received anything. Lurena Joiner have you received any fax in regards to pt

## 2021-04-09 NOTE — Telephone Encounter (Signed)
Rxs sent

## 2021-04-14 ENCOUNTER — Other Ambulatory Visit: Payer: Self-pay | Admitting: Internal Medicine

## 2021-04-14 DIAGNOSIS — F5104 Psychophysiologic insomnia: Secondary | ICD-10-CM

## 2021-04-14 NOTE — Telephone Encounter (Signed)
Requested medication (s) are due for refill today: yes  Requested medication (s) are on the active medication list: yes   Last refill: 11/17/20  #30   0 refills  Future visit scheduled no  Notes to clinic:not delegated  Requested Prescriptions  Pending Prescriptions Disp Refills   zolpidem (AMBIEN) 5 MG tablet [Pharmacy Med Name: ZOLPIDEM TARTRATE 5 MG TABLET] 30 tablet     Sig: TAKE ONE TABLET BY MOUTH EVERY NIGHT AT BEDTIME AS NEEDED FOR SLEEP      Not Delegated - Psychiatry:  Anxiolytics/Hypnotics Failed - 04/14/2021  1:00 PM      Failed - This refill cannot be delegated      Failed - Urine Drug Screen completed in last 360 days      Passed - Valid encounter within last 6 months    Recent Outpatient Visits           4 months ago Essential hypertension   [place], MD   9 months ago Need for influenza vaccination   [place], Jarome Matin, RPH-CPP   9 months ago Encounter for [employer] annual wellness exam   [place] Ladell Pier, MD   10 months ago Essential hypertension   [place], Deborah B, MD   1 year ago Headache syndrome   [facility] Ladell Pier, MD

## 2021-06-09 ENCOUNTER — Telehealth: Payer: Self-pay | Admitting: Internal Medicine

## 2021-06-09 NOTE — Telephone Encounter (Signed)
We received requests for 90-day supplies. However, this patient has not been seen since December. He will need an appointment scheduled before we can refill.

## 2021-06-09 NOTE — Telephone Encounter (Signed)
Copied from [place] [id number]. Topic: Quick Communication - Rx Refill/Question >> Jun 08, 2021  4:48 PM Pawlus, Brayton Layman A wrote: Pharmacy following up on a medication refill requests they sent over for:  carvedilol (COREG) 6.25 MG tablet lisinopril-hydrochlorothiazide (ZESTORETIC) 20-12.5 MG tablet tamsulosin (FLOMAX) 0.4 MG CAPS capsule.  Collier Salina, would you be able to assist with this?

## 2021-06-09 NOTE — Telephone Encounter (Signed)
Tried to call patient to schedule appointment. Line ringing busy.

## 2021-08-17 ENCOUNTER — Other Ambulatory Visit: Payer: Self-pay | Admitting: Internal Medicine

## 2021-08-17 DIAGNOSIS — I10 Essential (primary) hypertension: Secondary | ICD-10-CM

## 2021-08-17 NOTE — Telephone Encounter (Signed)
Requested medication (s) are due for refill today: yes  Requested medication (s) are on the active medication list: yes  Last refill:  04/09/21 #60 0 refills  Future visit scheduled: no  Notes to clinic:  no valid encounter within 6 months. Last seen 9 months ago last labs 05/10/2018. Called patient to schedule future appt. Busy signal. Unable to leave message. Do you want courtesy refill?     Requested Prescriptions  Pending Prescriptions Disp Refills   lisinopril-hydrochlorothiazide (ZESTORETIC) 20-12.5 MG tablet [Pharmacy Med Name: LISINOPRIL/HYDROCHLOROTHIAZIDE 20-12.5 MG Tablet] 60 tablet 0    Sig: TAKE 1 TABLET TWICE DAILY (NEED MD APPOINTMENT)     Cardiovascular:  ACEI + Diuretic Combos Failed - 08/17/2021  3:11 PM      Failed - Na in normal range and within 180 days    Sodium  Date Value Ref Range Status  05/10/2018 140 134 - 144 mmol/L Final          Failed - K in normal range and within 180 days    Potassium  Date Value Ref Range Status  05/10/2018 3.6 3.5 - 5.2 mmol/L Final          Failed - Cr in normal range and within 180 days    Creat  Date Value Ref Range Status  01/20/2017 0.89 0.70 - 1.25 mg/dL Final    Comment:      For patients > or = 69 years of age: The upper reference limit for Creatinine is approximately 13% higher for people identified as African-American.      Creatinine, Ser  Date Value Ref Range Status  05/10/2018 1.00 0.76 - 1.27 mg/dL Final          Failed - Ca in normal range and within 180 days    Calcium  Date Value Ref Range Status  05/10/2018 9.2 8.6 - 10.2 mg/dL Final          Failed - Valid encounter within last 6 months    Recent Outpatient Visits           9 months ago Essential hypertension   [place] Ladell Pier, MD   1 year ago Need for influenza vaccination   [place], Jarome Matin, RPH-CPP   1 year ago Encounter for [employer]  annual wellness exam   [place] Ladell Pier, MD   1 year ago Essential hypertension   [place] Ladell Pier, MD   1 year ago Headache syndrome   [place], MD              Passed - Patient is not pregnant      Passed - Last BP in normal range    BP Readings from Last 1 Encounters:  07/17/20 132/80

## 2021-09-01 ENCOUNTER — Other Ambulatory Visit: Payer: Self-pay | Admitting: Internal Medicine

## 2021-09-01 DIAGNOSIS — F5104 Psychophysiologic insomnia: Secondary | ICD-10-CM

## 2021-09-01 DIAGNOSIS — I10 Essential (primary) hypertension: Secondary | ICD-10-CM

## 2021-09-01 NOTE — Telephone Encounter (Signed)
Requested medications are due for refill today.  yes  Requested medications are on the active medications list.  yes  Last refill. Varies  Future visit scheduled.   Yes  Notes to clinic.  Pt is overdue for OV and labs. One medication is not delegated.

## 2021-09-01 NOTE — Telephone Encounter (Signed)
Medication Refill - Medication: Lisinopril, Carvedilol, flomax ([facility])// Ambien James Avila)  Has the patient contacted their pharmacy? Yes.   Pt called stating that he contacted pharmacy and they stated he needed an appt to get refills. PT scheduled appt next available on 09/16/21. Please advise.  (Agent: If no, request that the patient contact the pharmacy for the refill.) (Agent: If yes, when and what did the pharmacy advise?)  Preferred Pharmacy (with phone number or street name):  Has the patient been seen  [facility] [id number] - [place], [address]  [place] [address]  Phone: [phone] Fax: [phone]  Hours: Not open 24 hours   [place]  [place] [address]  Phone: [phone] Fax: [phone]  Hours: Not open 24 hours  for an appointment in the last year OR does the patient have an upcoming appointment? Yes.    Agent: Please be advised that RX refills may take up to 3 business days. We ask that you follow-up with your pharmacy.

## 2021-09-03 MED ORDER — CARVEDILOL 6.25 MG PO TABS: 6.2500 mg | ORAL_TABLET | Freq: Two times a day (BID) | ORAL | 0 refills | Status: DC

## 2021-09-03 MED ORDER — LISINOPRIL-HYDROCHLOROTHIAZIDE 20-12.5 MG PO TABS: 1.0000 | ORAL_TABLET | Freq: Two times a day (BID) | ORAL | 0 refills | Status: DC

## 2021-09-03 MED ORDER — TAMSULOSIN HCL 0.4 MG PO CAPS: ORAL_CAPSULE | ORAL | 0 refills | Status: DC

## 2021-09-16 ENCOUNTER — Ambulatory Visit: Payer: Medicare HMO | Admitting: Physician Assistant

## 2021-09-17 ENCOUNTER — Other Ambulatory Visit: Payer: Self-pay

## 2021-09-17 ENCOUNTER — Ambulatory Visit: Payer: Medicare HMO | Attending: Internal Medicine | Admitting: Internal Medicine

## 2021-09-17 ENCOUNTER — Encounter: Payer: Self-pay | Admitting: Internal Medicine

## 2021-09-17 VITALS — BP 132/82 | HR 70 | Resp 16 | Wt 176.2 lb

## 2021-09-17 DIAGNOSIS — Z5320 Procedure and treatment not carried out because of patient's decision for unspecified reasons: Secondary | ICD-10-CM

## 2021-09-17 DIAGNOSIS — F17210 Nicotine dependence, cigarettes, uncomplicated: Secondary | ICD-10-CM

## 2021-09-17 DIAGNOSIS — I10 Essential (primary) hypertension: Secondary | ICD-10-CM | POA: Diagnosis not present

## 2021-09-17 DIAGNOSIS — Z23 Encounter for immunization: Secondary | ICD-10-CM

## 2021-09-17 DIAGNOSIS — F5104 Psychophysiologic insomnia: Secondary | ICD-10-CM

## 2021-09-17 DIAGNOSIS — Z8619 Personal history of other infectious and parasitic diseases: Secondary | ICD-10-CM

## 2021-09-17 DIAGNOSIS — F17200 Nicotine dependence, unspecified, uncomplicated: Secondary | ICD-10-CM

## 2021-09-17 MED ORDER — ZOLPIDEM TARTRATE 5 MG PO TABS
ORAL_TABLET | ORAL | 0 refills | Status: DC
Start: 2021-09-17 — End: 2022-01-21
  Filled 2021-09-17: qty 30, fill #0

## 2021-09-17 NOTE — Progress Notes (Signed)
Patient ID: James Avila, male    DOB: Feb 15, 1951  MRN: [id number]  CC: Medication Refill and Hypertension   Subjective: James Avila is a 70 y.o. 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  Symptoms were not too bad.  HYPERTENSION Currently taking: see medication list.  Currently on lisinopril/HCTZ and carvedilol Med Adherence: [x]  Yes    []  No Medication side effects: []  Yes    [x]  No Adherence with salt restriction: [x]  Yes    []  No Home Monitoring?: []  Yes    [x]  No Monitoring Frequency: thinks BP elevated today  because he was taking some cold/flu rememdy OTC and drank 4 cups coffee this a.m Home BP results range: 140/100 1 mth ago SOB? []  Yes    [x]  No Chest Pain?: []  Yes    [x]  No Leg swelling?: []  Yes    [x]  No Headaches?: [x]  Yes occasionally    []  No Dizziness? [x]  Yes occasionally    []  No Comments:   Tob dep:  still smoking 10-15 cig/day.  Wants to quit but lacks will power.  Has patches but not using.   Hep C: History of hepatitis C with cirrhosis.  He has been cured.  Declines LFT and [place] to screen for [facility].  Requests refill on Ambien.  He does not take it every night.  He he denies any significant side effects from the medication including amnesia.  HM: Reports having had COVID shots x 4.  Due for flu and Pneumonia vaccines.  Agreeable to receiving those today.. Declines colon CA screen Last time he had baseline labs was in 2019.  He does not want to have any lab test done because he states his co-pay from [place] is around $95.  I have told him that it is very important that we at least check his kidney function with him having blood pressure and being on blood pressure medications.  He is agreeable to having just a BMP done today.    Patient Active  Problem List   Diagnosis Date Noted   Colon cancer screening declined 07/17/2020   Lung cancer screening declined by patient 07/17/2020   Headache syndrome 03/19/2020   Hepatitis C virus infection cured after antiviral drug therapy 02/05/2019   Panic disorder 02/05/2019   Nodule of shaft of penis 06/02/2017   Seborrheic keratoses 05/02/2017   Insomnia 04/06/2017   Plantar fasciitis, left 03/06/2017   Deltoid tendonitis, unspecified laterality 01/20/2017   Impotence 04/11/2016   Plantar wart of left foot 04/11/2016   Vitamin D deficiency 04/08/2016   Hepatic cirrhosis (Ramirez-Perez) 07/31/2014   BPH (benign prostatic hyperplasia) 10/10/2013   Anxiety state, unspecified 10/10/2013   Chronic back pain 10/10/2013   TOBACCO USE 09/07/2007   Hepatitis C virus infection without hepatic coma 05/10/2007   Essential hypertension 05/10/2007     Current Outpatient Medications on File Prior to Visit  Medication Sig Dispense Refill   aspirin 81 MG tablet Take 1 tablet (81 mg total) by mouth daily. 90 tablet 3   butalbital-acetaminophen-caffeine (FIORICET) 50-325-40 MG tablet TAKE 2 TABLETS BY MOUTH DAILY AS NEEDED FOR HEADACHE. 20 tablet 0   carvedilol (COREG) 6.25 MG tablet Take 1 tablet (6.25 mg total) by mouth 2 (two) times daily with a  meal. 60 tablet 0   Cholecalciferol (VITAMIN D3 PO) Take 5,000 mg by mouth daily.     lisinopril-hydrochlorothiazide (ZESTORETIC) 20-12.5 MG tablet Take 1 tablet by mouth 2 (two) times daily. You need to call and schedule a follow up appointment with Dr. Wynetta Emery prior to next refill request 60 tablet 0   sildenafil (VIAGRA) 50 MG tablet Take 1 tablet half hour to 1 hour before sexual intercourse. Limit to 1 tab/24 hrs 10 tablet 0   tamsulosin (FLOMAX) 0.4 MG CAPS capsule TAKE 2 CAPSULES BY MOUTH DAILY AFTER SUPPER. 60 capsule 0   butalbital-acetaminophen-caffeine (FIORICET) 50-325-40 MG tablet Take 2 tablets by mouth daily as needed for headache. 20 tablet 0   nicotine  (NICODERM CQ - DOSED IN MG/24 HOURS) 14 mg/24hr patch Place 1 patch (14 mg total) onto the skin daily. 28 patch 0   nicotine (NICODERM CQ - DOSED IN MG/24 HOURS) 21 mg/24hr patch Place 1 patch (21 mg total) onto the skin daily. 28 patch 1   No current facility-administered medications on file prior to visit.    Allergies  Allergen Reactions   Hydralazine     Causes numbness Increased blood pressure    Social History   Socioeconomic History   Marital status: Divorced    Spouse name: Not on file   Number of children: Not on file   Years of education: Not on file   Highest education level: Not on file  Occupational History   Not on file  Tobacco Use   Smoking status: Every Day    Packs/day: 0.50    Types: Cigarettes   Smokeless tobacco: Never   Tobacco comments:    Smoking 5-10 cigs/day  Vaping Use   Vaping Use: Never used  Substance and Sexual Activity   Alcohol use: Yes    Alcohol/week: 4.0 standard drinks    Types: 4 Cans of beer per week    Comment: weekly   Drug use: No   Sexual activity: Not on file  Other Topics Concern   Not on file  Social History Narrative   Not on file   Social Determinants of Health   Financial Resource Strain: Not on file  Food Insecurity: Not on file  Transportation Needs: Not on file  Physical Activity: Not on file  Stress: Not on file  Social Connections: Not on file  Intimate Partner Violence: Not on file    Family History  Problem Relation Age of Onset   Hypertension Mother    Hydrocephalus Mother    Stroke Mother    Diabetes Mother    Cancer Father 37       skin     Past Surgical History:  Procedure Laterality Date   TONSILLECTOMY      ROS: Review of Systems Negative except as stated above  PHYSICAL EXAM: BP 132/82   Pulse 70   Resp 16   Wt 176 lb 3.2 oz (79.9 kg)   SpO2 95%   BMI 25.28 kg/m   Wt Readings from Last 3 Encounters:  09/17/21 176 lb 3.2 oz (79.9 kg)  07/17/20 171 lb 3.2 oz (77.7 kg)   06/11/20 167 lb 6.4 oz (75.9 kg)    Physical Exam   General appearance - alert, well appearing, and in no distress Mental status - normal mood, behavior, speech, dress, motor activity, and thought processes Neck - supple, no significant adenopathy Chest - clear to auscultation, no wheezes, rales or rhonchi, symmetric air entry Heart - normal rate,  regular rhythm, normal S1, S2, no murmurs, rubs, clicks or gallops Extremities - peripheral pulses normal, no pedal edema, no clubbing or cyanosis  CMP Latest Ref Rng & Units 05/10/2018 01/20/2017 02/12/2015  Glucose 65 - 99 mg/dL 113(H) 98 99  BUN 8 - 27 mg/dL 17 16 14   Creatinine 0.76 - 1.27 mg/dL 1.00 0.89 0.89  Sodium 134 - 144 mmol/L 140 139 138  Potassium 3.5 - 5.2 mmol/L 3.6 3.7 3.8  Chloride 96 - 106 mmol/L 99 102 102  CO2 20 - 29 mmol/L 26 27 27   Calcium 8.6 - 10.2 mg/dL 9.2 9.2 8.9  Total Protein 6.0 - 8.5 g/dL 7.0 7.2 7.5  Total Bilirubin 0.0 - 1.2 mg/dL 0.6 0.8 0.7  Alkaline Phos 39 - 117 IU/L 60 50 48  AST 0 - 40 IU/L 13 15 18   ALT 0 - 44 IU/L 10 15 17    Lipid Panel     Component Value Date/Time   CHOL 179 10/08/2014 0912   TRIG 141 10/08/2014 0912   HDL 36 (L) 10/08/2014 0912   CHOLHDL 5.0 10/08/2014 0912   VLDL 28 10/08/2014 0912   LDLCALC 115 (H) 10/08/2014 0912    CBC    Component Value Date/Time   WBC 7.6 05/10/2018 1140   WBC 7.6 01/20/2017 1216   RBC 4.31 05/10/2018 1140   RBC 4.73 01/20/2017 1216   HGB 13.2 05/10/2018 1140   HCT 37.7 05/10/2018 1140   PLT 227 05/10/2018 1140   MCV 88 05/10/2018 1140   MCH 30.6 05/10/2018 1140   MCH 30.7 01/20/2017 1216   MCHC 35.0 05/10/2018 1140   MCHC 34.2 01/20/2017 1216   RDW 13.1 05/10/2018 1140   LYMPHSABS 3.1 05/10/2018 1140   MONOABS 0.6 04/30/2014 1500   EOSABS 0.4 05/10/2018 1140   BASOSABS 0.0 05/10/2018 1140    ASSESSMENT AND PLAN: 1. Essential hypertension Close to goal.  Continue current medications and low-salt diet. - Basic Metabolic  Panel  2. Tobacco dependence Advised to quit.  He is aware of the health risks associated with continued cigarette smoking.  He is not ready to give a trial of quitting.  3. Chronic insomnia Refill given on Ambien to take as needed.  He denies any significant side effects from the medication.  He does not have any aberrant behavior.  [place] controlled substance reporting system reviewed. - zolpidem (AMBIEN) 5 MG tablet; TAKE ONE TABLET BY MOUTH EVERY NIGHT AT BEDTIME AS NEEDED FOR SLEEP  Dispense: 30 tablet; Refill: 0  4. Need for vaccination against Streptococcus pneumoniae - Pneumococcal conjugate vaccine 20-valent (Prevnar 20)  5. Hepatitis C virus infection cured after antiviral drug therapy Patient had cirrhosis.  He declines liver ultrasound for [place] screening.  6. Colon cancer screening declined Strongly recommended.  Patient declined all forms of screening.  7. Need for immunization against influenza - Flu Vaccine QUAD 56mo+IM (Fluarix, Fluzone & Alfiuria Quad PF)    Patient was given the opportunity to ask questions.  Patient verbalized understanding of the plan and was able to repeat key elements of the plan.   Orders Placed This Encounter  Procedures   Flu Vaccine QUAD 49mo+IM (Fluarix, Fluzone & Alfiuria Quad PF)   Pneumococcal conjugate vaccine 20-valent (Prevnar 20)   Basic Metabolic Panel     Requested Prescriptions   Signed Prescriptions Disp Refills   zolpidem (AMBIEN) 5 MG tablet 30 tablet 0    Sig: TAKE ONE TABLET BY MOUTH EVERY NIGHT AT BEDTIME AS NEEDED  FOR SLEEP    Return in about 4 months (around 01/18/2022).  Karle Plumber, MD, FACP

## 2021-09-18 LAB — BASIC METABOLIC PANEL
BUN/Creatinine Ratio: 22 (ref 10–24)
BUN: 25 mg/dL (ref 8–27)
CO2: 25 mmol/L (ref 20–29)
Calcium: 9.3 mg/dL (ref 8.6–10.2)
Chloride: 101 mmol/L (ref 96–106)
Creatinine, Ser: 1.12 mg/dL (ref 0.76–1.27)
Glucose: 97 mg/dL (ref 70–99)
Potassium: 4.6 mmol/L (ref 3.5–5.2)
Sodium: 140 mmol/L (ref 134–144)
eGFR: 71 mL/min/{1.73_m2} (ref >59)

## 2021-09-18 NOTE — Progress Notes (Signed)
Let patient know that his kidney function is stable.  Sodium and potassium levels are normal.

## 2021-10-06 DIAGNOSIS — M79602 Pain in left arm: Secondary | ICD-10-CM | POA: Diagnosis not present

## 2021-10-06 DIAGNOSIS — S41119A Laceration without foreign body of unspecified upper arm, initial encounter: Secondary | ICD-10-CM | POA: Diagnosis not present

## 2021-11-05 ENCOUNTER — Other Ambulatory Visit: Payer: Self-pay | Admitting: Internal Medicine

## 2021-11-05 DIAGNOSIS — I10 Essential (primary) hypertension: Secondary | ICD-10-CM

## 2021-11-05 NOTE — Telephone Encounter (Signed)
Requested Prescriptions  Pending Prescriptions Disp Refills  . tamsulosin (FLOMAX) 0.4 MG CAPS capsule [Pharmacy Med Name: TAMSULOSIN HYDROCHLORIDE 0.4 MG Capsule] 180 capsule 0    Sig: TAKE 2 CAPSULES BY MOUTH DAILY AFTER SUPPER. (NEED MD APPOINTMENT)     Urology: Alpha-Adrenergic Blocker Passed - 11/05/2021  7:25 PM      Passed - Last BP in normal range    BP Readings from Last 1 Encounters:  09/17/21 132/82         Passed - Valid encounter within last 12 months    Recent Outpatient Visits          1 month ago Essential hypertension   [place], MD   11 months ago Essential hypertension   [place], Deborah B, MD   1 year ago Need for influenza vaccination   [place], RPH-CPP   1 year ago Encounter for [employer] annual wellness exam   [place], Deborah B, MD   1 year ago Essential hypertension   [place], MD      Future Appointments            In 2 months Wynetta Emery Dalbert Batman, MD [place]           . lisinopril-hydrochlorothiazide (ZESTORETIC) 20-12.5 MG tablet [Pharmacy Med Name: LISINOPRIL/HYDROCHLOROTHIAZIDE 20-12.5 MG Tablet] 180 tablet 0    Sig: TAKE 1 TABLET TWICE DAILY (NEED MD APPOINTMENT FOR REFILLS)     Cardiovascular:  ACEI + Diuretic Combos Passed - 11/05/2021  7:25 PM      Passed - Na in normal range and within 180 days    Sodium  Date Value Ref Range Status  09/17/2021 140 134 - 144 mmol/L Final         Passed - K in normal range and within 180 days    Potassium  Date Value Ref Range Status  09/17/2021 4.6 3.5 - 5.2 mmol/L Final         Passed - Cr in normal range and within 180 days    Creat  Date Value Ref Range Status  01/20/2017 0.89 0.70 - 1.25 mg/dL Final    Comment:       For patients > or = 69 years of age: The upper reference limit for Creatinine is approximately 13% higher for people identified as African-American.      Creatinine, Ser  Date Value Ref Range Status  09/17/2021 1.12 0.76 - 1.27 mg/dL Final         Passed - Ca in normal range and within 180 days    Calcium  Date Value Ref Range Status  09/17/2021 9.3 8.6 - 10.2 mg/dL Final         Passed - Patient is not pregnant      Passed - Last BP in normal range    BP Readings from Last 1 Encounters:  09/17/21 132/82         Passed - Valid encounter within last 6 months    Recent Outpatient Visits          1 month ago Essential hypertension   [place] Ladell Pier, MD   11 months ago Essential hypertension   [place] Ladell Pier, MD   1  year ago Need for influenza vaccination   [place], RPH-CPP   1 year ago Encounter for [employer] annual wellness exam   [place], Deborah B, MD   1 year ago Essential hypertension   [place], MD      Future Appointments            In 2 months Wynetta Emery Dalbert Batman, MD [place]           . carvedilol (COREG) 6.25 MG tablet [Pharmacy Med Name: CARVEDILOL 6.25 MG Tablet] 180 tablet 0    Sig: TAKE 1 TABLET TWICE DAILY WITH MEALS (NEED MD APPOINTMENT)     Cardiovascular:  Beta Blockers Passed - 11/05/2021  7:25 PM      Passed - Last BP in normal range    BP Readings from Last 1 Encounters:  09/17/21 132/82         Passed - Last Heart Rate in normal range    Pulse Readings from Last 1 Encounters:  09/17/21 70         Passed - Valid encounter within last 6 months    Recent Outpatient Visits          1 month ago Essential hypertension   [place], MD   11 months ago Essential hypertension   [place], MD   1 year ago Need for influenza vaccination   [place], Jarome Matin, RPH-CPP   1 year ago Encounter for [employer] annual wellness exam   [place], Deborah B, MD   1 year ago Essential hypertension   [place], MD      Future Appointments            In 2 months Wynetta Emery Dalbert Batman, MD [place]

## 2021-12-29 ENCOUNTER — Other Ambulatory Visit: Payer: Self-pay | Admitting: Internal Medicine

## 2021-12-29 DIAGNOSIS — F5104 Psychophysiologic insomnia: Secondary | ICD-10-CM

## 2021-12-29 DIAGNOSIS — N529 Male erectile dysfunction, unspecified: Secondary | ICD-10-CM

## 2021-12-30 NOTE — Telephone Encounter (Signed)
Requested medication (s) are due for refill today: no, has newer rx at different pharm  Requested medication (s) are on the active medication list: yes  Future visit scheduled: 01/21/22  Notes to clinic:  Has newer rx at different pharm. This medication can not be delegated, please assess.    Requested Prescriptions  Pending Prescriptions Disp Refills   zolpidem (AMBIEN) 5 MG tablet [Pharmacy Med Name: ZOLPIDEM TARTRATE 5 MG TABLET] 30 tablet     Sig: TAKE ONE TABLET BY MOUTH EVERY NIGHT AT BEDTIME AS NEEDED FOR SLEEP     Not Delegated - Psychiatry:  Anxiolytics/Hypnotics Failed - 12/29/2021  3:33 PM      Failed - This refill cannot be delegated      Failed - Urine Drug Screen completed in last 360 days      Passed - Valid encounter within last 6 months    Recent Outpatient Visits           3 months ago Essential hypertension   [place], Deborah B, MD   1 year ago Essential hypertension   [place], Deborah B, MD   1 year ago Need for influenza vaccination   [place], Jarome Matin, RPH-CPP   1 year ago Encounter for [employer] annual wellness exam   [place], Deborah B, MD   1 year ago Essential hypertension   [place], Deborah B, MD       Future Appointments             In 3 weeks Ladell Pier, MD [place]            Refused Prescriptions Disp Refills   sildenafil (VIAGRA) 50 MG tablet [Pharmacy Med Name: SILDENAFIL 50 MG TABLET] 10 tablet 0    Sig: TAKE 1 TABLET BY MOUTH 30 MINUTES TO 1 HOUR BEFORE SEXUAL INTERCOURSE. LIMIT TO 1 TABLET PER 24 HOURS     Urology: Erectile Dysfunction Agents Failed - 12/29/2021  3:33 PM      Failed - AST in normal range and within 360 days    AST  Date Value Ref Range Status  05/10/2018 13 0 - 40  IU/L Final          Failed - ALT in normal range and within 360 days    ALT  Date Value Ref Range Status  05/10/2018 10 0 - 44 IU/L Final          Passed - Last BP in normal range    BP Readings from Last 1 Encounters:  09/17/21 132/82          Passed - Valid encounter within last 12 months    Recent Outpatient Visits           3 months ago Essential hypertension   [place], Deborah B, MD   1 year ago Essential hypertension   [place], Deborah B, MD   1 year ago Need for influenza vaccination   [place], Jarome Matin, RPH-CPP   1 year ago Encounter for [employer] annual wellness exam   [place], Deborah B, MD   1 year ago Essential hypertension   [place] Karle Plumber  B, MD       Future Appointments             In 3 weeks Ladell Pier, MD [place]

## 2021-12-30 NOTE — Telephone Encounter (Signed)
Requested Prescriptions  Pending Prescriptions Disp Refills   zolpidem (AMBIEN) 5 MG tablet [Pharmacy Med Name: ZOLPIDEM TARTRATE 5 MG TABLET] 30 tablet     Sig: TAKE ONE TABLET BY MOUTH EVERY NIGHT AT BEDTIME AS NEEDED FOR SLEEP     Not Delegated - Psychiatry:  Anxiolytics/Hypnotics Failed - 12/29/2021  3:33 PM      Failed - This refill cannot be delegated      Failed - Urine Drug Screen completed in last 360 days      Passed - Valid encounter within last 6 months    Recent Outpatient Visits          3 months ago Essential hypertension   [place], Deborah B, MD   1 year ago Essential hypertension   [place], Deborah B, MD   1 year ago Need for influenza vaccination   [place], Jarome Matin, RPH-CPP   1 year ago Encounter for [employer] annual wellness exam   [place], Deborah B, MD   1 year ago Essential hypertension   [place], MD      Future Appointments            In 3 weeks Ladell Pier, MD [place]            sildenafil (VIAGRA) 50 MG tablet [Pharmacy Med Name: SILDENAFIL 50 MG TABLET] 10 tablet 0    Sig: TAKE 1 TABLET BY MOUTH 30 MINUTES TO 1 HOUR BEFORE SEXUAL INTERCOURSE. LIMIT TO 1 TABLET PER 24 HOURS     Urology: Erectile Dysfunction Agents Failed - 12/29/2021  3:33 PM      Failed - AST in normal range and within 360 days    AST  Date Value Ref Range Status  05/10/2018 13 0 - 40 IU/L Final         Failed - ALT in normal range and within 360 days    ALT  Date Value Ref Range Status  05/10/2018 10 0 - 44 IU/L Final         Passed - Last BP in normal range    BP Readings from Last 1 Encounters:  09/17/21 132/82         Passed - Valid encounter within last 12 months    Recent Outpatient Visits          3  months ago Essential hypertension   [place], Deborah B, MD   1 year ago Essential hypertension   [place], Deborah B, MD   1 year ago Need for influenza vaccination   [place], Jarome Matin, RPH-CPP   1 year ago Encounter for [employer] annual wellness exam   [place], Deborah B, MD   1 year ago Essential hypertension   [place], MD      Future Appointments            In 3 weeks Wynetta Emery Dalbert Batman, MD [place]

## 2022-01-20 NOTE — Telephone Encounter (Signed)
Will forward to provider  

## 2022-01-21 ENCOUNTER — Ambulatory Visit: Payer: Medicare HMO | Admitting: Internal Medicine

## 2022-01-26 ENCOUNTER — Ambulatory Visit: Payer: Medicare HMO | Attending: Internal Medicine | Admitting: Internal Medicine

## 2022-01-26 DIAGNOSIS — M25511 Pain in right shoulder: Secondary | ICD-10-CM

## 2022-01-26 DIAGNOSIS — G8929 Other chronic pain: Secondary | ICD-10-CM

## 2022-01-26 DIAGNOSIS — I10 Essential (primary) hypertension: Secondary | ICD-10-CM | POA: Diagnosis not present

## 2022-01-26 DIAGNOSIS — F5104 Psychophysiologic insomnia: Secondary | ICD-10-CM

## 2022-01-26 DIAGNOSIS — N529 Male erectile dysfunction, unspecified: Secondary | ICD-10-CM | POA: Diagnosis not present

## 2022-01-26 DIAGNOSIS — F17200 Nicotine dependence, unspecified, uncomplicated: Secondary | ICD-10-CM | POA: Diagnosis not present

## 2022-01-26 NOTE — Progress Notes (Signed)
Patient ID: James Avila, male   DOB: 29-Mar-1951, 71 y.o.   MRN: [id number] ?Virtual Visit via Telephone Note ? ?I connected with James Avila on 01/26/2022 at 3:21 p.m by telephone and verified that I am speaking with the correct person using two identifiers ? ?Location: ?Patient: home ?Provider: office ? ?Participants: ?Myself ?Patient ? ?  ?I discussed the limitations, risks, security and privacy concerns of performing an evaluation and management service by telephone and the availability of in person appointments. I also discussed with the patient that there may be a patient responsible charge related to this service. The patient expressed understanding and agreed to proceed. ? ? 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  Last eval 08/2021. Purpose of today's visit is chronic ds management ? ?Pt reports since last visit, he had minor injury at [employer] to one of his arms.  He reports he was seen at [facility] and every thing was take care of and is healed up ? ?HYPERTENSION ?Currently taking: see medication list ?Med Adherence: [x]  Yes   Lis/HCTZ and Coreg ?Medication side effects: []  Yes    [x]  No ?Adherence with salt restriction: []  Yes    [x]  No ?Home Monitoring?: [x]  Yes    []  No ?Monitoring Frequency: about every 2 wks ?Home BP results range: 120s/90 ?SOB? []  Yes    [x]  No ?Chest Pain?: []  Yes    [x]  No ?Leg swelling?: []  Yes    [x]  No ?Headaches?: []  Yes    [x]  No ?Dizziness? [x]  Yes occasionally  []  No ?Comments:  ? ?Tob dep:  reports he knows he needs to quit but not making any effort to do so.  Not ready to quit. ? ?Insomnia:  does well with Ambien which he takes sparely.  Denies feeling hang over when he wakes up in the mornings when he takes Ambien th enight before.   ? ?ED:  request RF on generic Viagra ? ?Has pain in RT shoulder sometimes when he is sleeping.  At  one time he had "lock shoulder" where he could not reach but it has gotten better over time.  Takes Aleve occasionally if he has pain.   ?Outpatient Encounter Medications as of 01/26/2022  ?Medication Sig  ? aspirin 81 MG tablet Take 1 tablet (81 mg total) by mouth daily.  ? butalbital-acetaminophen-caffeine (FIORICET) 50-325-40 MG tablet Take 2 tablets by mouth daily as needed for headache.  ? carvedilol (COREG) 6.25 MG tablet TAKE 1 TABLET TWICE DAILY WITH MEALS (NEED MD APPOINTMENT)  ? Cholecalciferol (VITAMIN D3 PO) Take 5,000 mg by mouth daily.  ? lisinopril-hydrochlorothiazide (ZESTORETIC) 20-12.5 MG tablet TAKE 1 TABLET TWICE DAILY (NEED MD APPOINTMENT FOR REFILLS)  ? sildenafil (VIAGRA) 50 MG tablet Take 1 tablet half hour to 1 hour before sexual intercourse. Limit to 1 tab/24 hrs  ? tamsulosin (FLOMAX) 0.4 MG CAPS capsule TAKE 2 CAPSULES BY MOUTH DAILY AFTER SUPPER. (NEED MD APPOINTMENT)  ? zolpidem (AMBIEN) 5 MG tablet TAKE ONE TABLET BY MOUTH EVERY NIGHT AT BEDTIME AS NEEDED FOR SLEEP  ? [DISCONTINUED] nicotine (NICODERM CQ - DOSED IN MG/24 HOURS) 14 mg/24hr patch Place 1 patch (14 mg total) onto the skin daily.  ? [DISCONTINUED] nicotine (NICODERM CQ - DOSED IN MG/24 HOURS) 21 mg/24hr patch Place 1 patch (21 mg total) onto the skin daily.  ? ?No  facility-administered encounter medications on file as of 01/26/2022.  ? ? ?  ?Observations/Objective: ?No direct observation done as this was a telephone encounter. ? ?Assessment and Plan: ?1. Essential hypertension ?Diastolic blood pressure reported is not at goal. ?Encouraged him to limit salt in the foods.  Encouraged him to check blood pressure at least once a week with goal being 130/80 or lower. ? ?2. Tobacco dependence ?Advised to quit.  He is aware of health risks associated with smoking.  He is not ready to give a trial of quitting. ? ?3. Chronic insomnia ?Patient will continue the Ambien as needed.  Recent refill sent.  Good sleep hygiene encouraged. ? ?4.  Erectile dysfunction, unspecified erectile dysfunction type ?Refill sent on Viagra.  I reminded him of possible side effects of the medication.  Advised to be seen in the ER if he has an erection lasting longer than 2 to 3 hours, has sudden hearing loss or sudden vision changes. ? ?5. Chronic right shoulder pain ?Patient states this is not a major issue for him.  He declines x-ray or referral to orthopedics. ? ? ?Follow Up Instructions: ?4 mths ?  ?I discussed the assessment and treatment plan with the patient. The patient was provided an opportunity to ask questions and all were answered. The patient agreed with the plan and demonstrated an understanding of the instructions. ?  ?The patient was advised to call back or seek an in-person evaluation if the symptoms worsen or if the condition fails to improve as anticipated. ? ?I  Spent 10 minutes on this telephone encounter ? ?This note has been created with Surveyor, quantity. Any transcriptional errors are unintentional. ? ?Karle Plumber, MD ? ?

## 2022-03-16 ENCOUNTER — Telehealth: Payer: Self-pay

## 2022-03-16 NOTE — Telephone Encounter (Signed)
Contacted pt to go over lab results pt is aware and doesn't have any questions or concerns 

## 2022-04-04 ENCOUNTER — Other Ambulatory Visit: Payer: Self-pay | Admitting: Internal Medicine

## 2022-04-04 DIAGNOSIS — I10 Essential (primary) hypertension: Secondary | ICD-10-CM

## 2022-04-05 NOTE — Telephone Encounter (Signed)
Requested medications are due for refill today.  yes ? ?Requested medications are on the active medications list.  yes ? ?Last refill. Zestoric 11/05/2021 #180 0 refills, Flomax 11/05/2021 #180 0 refills, coreg 11/05/2021 #180 0 refills ? ?Future visit scheduled.   yes ? ?Notes to clinic.  Medication refills failed protocol d/t abnormal - expired labs. ? ? ? ?Requested Prescriptions  ?Pending Prescriptions Disp Refills  ? lisinopril-hydrochlorothiazide (ZESTORETIC) 20-12.5 MG tablet [Pharmacy Med Name: LISINOPRIL/HYDROCHLOROTHIAZIDE 20-12.5 MG Tablet] 180 tablet 0  ?  Sig: TAKE 1 TABLET TWICE DAILY (NEED MD APPOINTMENT FOR REFILLS)  ?  ? Cardiovascular:  ACEI + Diuretic Combos Failed - 04/04/2022  3:15 AM  ?  ?  Failed - Na in normal range and within 180 days  ?  Sodium  ?Date Value Ref Range Status  ?09/17/2021 140 134 - 144 mmol/L Final  ?  ?  ?  ?  Failed - K in normal range and within 180 days  ?  Potassium  ?Date Value Ref Range Status  ?09/17/2021 4.6 3.5 - 5.2 mmol/L Final  ?  ?  ?  ?  Failed - Cr in normal range and within 180 days  ?  Creat  ?Date Value Ref Range Status  ?01/20/2017 0.89 0.70 - 1.25 mg/dL Final  ?  Comment:  ?    ?For patients > or = 70 years of age: The upper reference limit for ?Creatinine is approximately 13% higher for people identified as ?African-American. ?  ?  ? ?Creatinine, Ser  ?Date Value Ref Range Status  ?09/17/2021 1.12 0.76 - 1.27 mg/dL Final  ?  ?  ?  ?  Failed - eGFR is 30 or above and within 180 days  ?  GFR, Est African American  ?Date Value Ref Range Status  ?01/20/2017 >89 >=60 mL/min Final  ? ?GFR calc Af Wyvonnia Lora  ?Date Value Ref Range Status  ?05/10/2018 91 >59 mL/min/1.73 Final  ? ?GFR, Est Non African American  ?Date Value Ref Range Status  ?01/20/2017 >89 >=60 mL/min Final  ? ?GFR calc non Af Amer  ?Date Value Ref Range Status  ?05/10/2018 79 >59 mL/min/1.73 Final  ? ?eGFR  ?Date Value Ref Range Status  ?09/17/2021 71 >59 mL/min/1.73 Final  ?  ?  ?  ?  Failed - Valid  encounter within last 6 months  ?  Recent Outpatient Visits   ? ?      ? 2 months ago Essential hypertension  ? [place] Ladell Pier, MD  ? 6 months ago Essential hypertension  ? [place] Ladell Pier, MD  ? 1 year ago Essential hypertension  ? [place] Ladell Pier, MD  ? 1 year ago Need for influenza vaccination  ? [place], RPH-CPP  ? 1 year ago Encounter for [employer] annual wellness exam  ? [place] Ladell Pier, MD  ? ?  ?  ?Future Appointments   ? ?        ? In 1 month Ladell Pier, MD [place]  ? ?  ? ? ?  ?  ?  Passed - Patient is not pregnant  ?  ?  Passed - Last BP in normal range  ?  BP Readings from Last 1 Encounters:  ?09/17/21 132/82  ?  ?  ?  ?  ?  tamsulosin (FLOMAX) 0.4 MG CAPS capsule [Pharmacy Med Name: TAMSULOSIN HYDROCHLORIDE 0.4 MG Capsule] 180 capsule 0  ?  Sig: TAKE 2 CAPSULES BY MOUTH DAILY AFTER SUPPER. (NEED MD APPOINTMENT)  ?  ? Urology: Alpha-Adrenergic Blocker Failed - 04/04/2022  3:15 AM  ?  ?  Failed - PSA in normal range and within 360 days  ?  PSA  ?Date Value Ref Range Status  ?01/20/2017 0.6 <=4.0 ng/mL Final  ?  Comment:  ?    ?The total PSA value from this assay system is standardized against the ?WHO standard. The test result will be approximately 20% lower when ?compared to the equimolar-standardized total PSA (Beckman Coulter). ?Comparison of serial PSA results should be interpreted with this fact ?in mind. ?  ?This test was performed using the Siemens chemiluminescent method. ?Values obtained from different assay methods cannot be used ?interchangeably. PSA levels, regardless of value, should not be ?interpreted as absolute evidence of the presence or absence of ?disease. ?  ?  ?  ?  ?  ?  Passed - Last BP in normal range  ?  BP  Readings from Last 1 Encounters:  ?09/17/21 132/82  ?  ?  ?  ?  Passed - Valid encounter within last 12 months  ?  Recent Outpatient Visits   ? ?      ? 2 months ago Essential hypertension  ? [place] Ladell Pier, MD  ? 6 months ago Essential hypertension  ? [place] Ladell Pier, MD  ? 1 year ago Essential hypertension  ? [place] Ladell Pier, MD  ? 1 year ago Need for influenza vaccination  ? [place], RPH-CPP  ? 1 year ago Encounter for [employer] annual wellness exam  ? [place] Ladell Pier, MD  ? ?  ?  ?Future Appointments   ? ?        ? In 1 month Ladell Pier, MD [place]  ? ?  ? ? ?  ?  ?  ? carvedilol (COREG) 6.25 MG tablet [Pharmacy Med Name: CARVEDILOL 6.25 MG Tablet] 180 tablet 0  ?  Sig: TAKE 1 TABLET TWICE DAILY WITH MEALS (NEED MD APPOINTMENT)  ?  ? Cardiovascular: Beta Blockers 3 Failed - 04/04/2022  3:15 AM  ?  ?  Failed - AST in normal range and within 360 days  ?  AST  ?Date Value Ref Range Status  ?05/10/2018 13 0 - 40 IU/L Final  ?  ?  ?  ?  Failed - ALT in normal range and within 360 days  ?  ALT  ?Date Value Ref Range Status  ?05/10/2018 10 0 - 44 IU/L Final  ?  ?  ?  ?  Failed - Valid encounter within last 6 months  ?  Recent Outpatient Visits   ? ?      ? 2 months ago Essential hypertension  ? [place] Ladell Pier, MD  ? 6 months ago Essential hypertension  ? [place] Ladell Pier, MD  ? 1 year ago Essential hypertension  ? [place] Ladell Pier, MD  ? 1 year ago Need for influenza vaccination  ? [place],  Jarome [place], RPH-CPP  ? 1 year ago Encounter for [employer] annual wellness exam  ?  [place] Ladell Pier, MD  ? ?  ?  ?Future Appointments   ? ?        ? In 1 month Ladell Pier, MD [place]  ? ?  ? ? ?  ?  ?  Passed - Cr in normal range and within 360 days  ?  Creat  ?Date Value Ref Range Status  ?01/20/2017 0.89 0.70 - 1.25 mg/dL Final  ?  Comment:  ?    ?For patients > or = 70 years of age: The upper reference limit for ?Creatinine is approximately 13% higher for people identified as ?African-American. ?  ?  ? ?Creatinine, Ser  ?Date Value Ref Range Status  ?09/17/2021 1.12 0.76 - 1.27 mg/dL Final  ?  ?  ?  ?  Passed - Last BP in normal range  ?  BP Readings from Last 1 Encounters:  ?09/17/21 132/82  ?  ?  ?  ?  Passed - Last Heart Rate in normal range  ?  Pulse Readings from Last 1 Encounters:  ?09/17/21 70  ?  ?  ?  ?  ?  ?

## 2022-06-02 ENCOUNTER — Encounter: Payer: Self-pay | Admitting: Internal Medicine

## 2022-06-02 ENCOUNTER — Ambulatory Visit: Payer: Medicare HMO | Attending: Internal Medicine | Admitting: Internal Medicine

## 2022-06-02 VITALS — BP 90/60 | HR 77 | Temp 98.0°F | Ht 70.0 in | Wt 171.8 lb

## 2022-06-02 DIAGNOSIS — F17200 Nicotine dependence, unspecified, uncomplicated: Secondary | ICD-10-CM

## 2022-06-02 DIAGNOSIS — R252 Cramp and spasm: Secondary | ICD-10-CM

## 2022-06-02 DIAGNOSIS — K7460 Unspecified cirrhosis of liver: Secondary | ICD-10-CM | POA: Diagnosis not present

## 2022-06-02 DIAGNOSIS — I952 Hypotension due to drugs: Secondary | ICD-10-CM | POA: Diagnosis not present

## 2022-06-02 DIAGNOSIS — M545 Low back pain, unspecified: Secondary | ICD-10-CM | POA: Diagnosis not present

## 2022-06-02 DIAGNOSIS — F5104 Psychophysiologic insomnia: Secondary | ICD-10-CM

## 2022-06-02 DIAGNOSIS — G8929 Other chronic pain: Secondary | ICD-10-CM | POA: Diagnosis not present

## 2022-06-02 MED ORDER — ZOLPIDEM TARTRATE 5 MG PO TABS: ORAL_TABLET | ORAL | 2 refills | Status: DC

## 2022-06-02 NOTE — Patient Instructions (Signed)
Hold off on taking your blood pressure medications this evening. Try to check your blood pressure daily for the next several days.  If blood pressure remains low please let me know so that I can tell you how to adjust your medications.  Avoid taking extra blood pressure medications.  Make sure that you are drinking several glasses of water daily to stay hydrated.  Please stop downstairs at the radiology department with [facility] imaging to get the x-ray done of your back.  I will send an updated prescription to your pharmacy for the Ambien 5 mg 1 tablet at bedtime as needed.

## 2022-06-02 NOTE — Progress Notes (Signed)
Patient ID: James Avila, male    DOB: 08/30/1951  MRN: [id number]  CC: chronic ds management  Subjective: James Avila is a 71 y.o. 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  HTN: Patient is on lisinopril/HCTZ 20/12.5 mg twice a day, carvedilol 6.25 mg twice a day.  Blood pressure noted to be low today on this office visit.  He took his usual dose of medications this morning.  He then took half a tablet of lisinopril/HCTZ again later in the morning because he felt that his carotid pulse was "hot." -Denies any dizziness or lightheadedness, chest pains, shortness of breath. -He has not been checking blood pressure. -Pulse ox also noted to be in the low 90s when he came in today.  Denies any shortness of breath, swelling in the legs or recent long distance travel.  Complains of pain across the lower back intermittently since the age of 33.  Worse over the past 3 months.  Got a new mattress but pain is no better. No radiation down the legs, no numbness or tingling.  No problems controlling bowel or bladder. Pain worse in the mornings.  Has to lay on a heating pad.  Pain lingers for 2 to 3 hours and gets better as the day progresses.  Wears a back brace throughout the day.  Some nights he does not sleep well due to pain.  Did not sleep much last night.  Continues to have issues with chronic insomnia.  He is on Ambien 5 mg and in the past with take just a half a tablet as needed.  Now he takes a whole tablet 3 to 4 days a week.  On other nights he would take melatonin or valerian root instead.  These help some.  Tobacco dependence: Continues to smoke.  Smokes three fourths of a pack a day.  Not ready to quit.  Complains of nocturnal cramps which improves by eating a little bit of mustard.  Patient Active  Problem List   Diagnosis Date Noted   Colon cancer screening declined 07/17/2020   Lung cancer screening declined by patient 07/17/2020   Headache syndrome 03/19/2020   Hepatitis C virus infection cured after antiviral drug therapy 02/05/2019   Panic disorder 02/05/2019   Nodule of shaft of penis 06/02/2017   Seborrheic keratoses 05/02/2017   Insomnia 04/06/2017   Plantar fasciitis, left 03/06/2017   Deltoid tendonitis, unspecified laterality 01/20/2017   Impotence 04/11/2016   Plantar wart of left foot 04/11/2016   Vitamin D deficiency 04/08/2016   Hepatic cirrhosis ([place]) 07/31/2014   BPH (benign prostatic hyperplasia) 10/10/2013   Anxiety state, unspecified 10/10/2013   Chronic back pain 10/10/2013   TOBACCO USE 09/07/2007   Hepatitis C virus infection without hepatic coma 05/10/2007   Essential hypertension 05/10/2007     Current Outpatient Medications on File Prior to Visit  Medication Sig Dispense Refill   aspirin 81 MG tablet Take 1 tablet (81 mg total) by mouth daily. 90 tablet 3   butalbital-acetaminophen-caffeine (FIORICET) 50-325-40 MG tablet Take 2 tablets by mouth daily as needed for headache. 20 tablet 0   carvedilol (COREG) 6.25 MG tablet TAKE 1 TABLET TWICE DAILY WITH MEALS (NEED MD APPOINTMENT) 180 tablet 0   Cholecalciferol (VITAMIN D3 PO) Take 5,000 mg by mouth daily.  lisinopril-hydrochlorothiazide (ZESTORETIC) 20-12.5 MG tablet TAKE 1 TABLET TWICE DAILY (NEED MD APPOINTMENT FOR REFILLS) 180 tablet 0   sildenafil (VIAGRA) 50 MG tablet Take 1 tablet half hour to 1 hour before sexual intercourse. Limit to 1 tab/24 hrs 10 tablet 0   tamsulosin (FLOMAX) 0.4 MG CAPS capsule TAKE 2 CAPSULES BY MOUTH DAILY AFTER SUPPER. (NEED MD APPOINTMENT) 180 capsule 0   zolpidem (AMBIEN) 5 MG tablet TAKE ONE TABLET BY MOUTH EVERY NIGHT AT BEDTIME AS NEEDED FOR SLEEP 30 tablet 0   No current facility-administered medications on file prior to visit.    Allergies  Allergen  Reactions   Hydralazine     Causes numbness Increased blood pressure    Social History   Socioeconomic History   Marital status: Divorced    Spouse name: Not on file   Number of children: Not on file   Years of education: Not on file   Highest education level: Not on file  Occupational History   Not on file  Tobacco Use   Smoking status: Every Day    Packs/day: 0.50    Types: Cigarettes   Smokeless tobacco: Never   Tobacco comments:    Smoking 5-10 cigs/day  Vaping Use   Vaping Use: Never used  Substance and Sexual Activity   Alcohol use: Yes    Alcohol/week: 4.0 standard drinks of alcohol    Types: 4 Cans of beer per week    Comment: weekly   Drug use: No   Sexual activity: Not on file  Other Topics Concern   Not on file  Social History Narrative   Not on file   Social Determinants of Health   Financial Resource Strain: Not on file  Food Insecurity: Not on file  Transportation Needs: Not on file  Physical Activity: Not on file  Stress: Not on file  Social Connections: Not on file  Intimate Partner Violence: Not on file    Family History  Problem Relation Age of Onset   Hypertension Mother    Hydrocephalus Mother    Stroke Mother    Diabetes Mother    Cancer Father 62       skin     Past Surgical History:  Procedure Laterality Date   TONSILLECTOMY      ROS: Review of Systems Negative except as stated above  PHYSICAL EXAM: BP 90/60   Pulse 77   Temp 98 F (36.7 C) (Oral)   Ht '5\' 10"'$  (1.778 m)   Wt 171 lb 12.8 oz (77.9 kg)   SpO2 94%   BMI 24.65 kg/m   Physical Exam Repeat pulse ox 94 to 96% room air. Sitting: BP 89/64, pulse 71, pulse ox 96% Standing: BP 105/60, pulse of 72. General appearance -CMA reported that patient initially looked a little clammy.  By the time I saw him he looked his usual self. Mental status - normal mood, behavior, speech, dress, motor activity, and thought processes Mouth -slightly dry Neck - supple, no  significant adenopathy Chest - clear to auscultation, no wheezes, rales or rhonchi, symmetric air entry Heart - normal rate, regular rhythm, normal S1, S2, no murmurs, rubs, clicks or gallops Extremities - peripheral pulses normal, no pedal edema, no clubbing or cyanosis     Latest Ref Rng & Units 09/17/2021    3:22 PM 05/10/2018   11:40 AM 01/20/2017   12:16 PM  CMP  Glucose 70 - 99 mg/dL 97  113  98   BUN 8 - 27  mg/dL '25  17  16   '$ Creatinine 0.76 - 1.27 mg/dL 1.12  1.00  0.89   Sodium 134 - 144 mmol/L 140  140  139   Potassium 3.5 - 5.2 mmol/L 4.6  3.6  3.7   Chloride 96 - 106 mmol/L 101  99  102   CO2 20 - 29 mmol/L '25  26  27   '$ Calcium 8.6 - 10.2 mg/dL 9.3  9.2  9.2   Total Protein 6.0 - 8.5 g/dL  7.0  7.2   Total Bilirubin 0.0 - 1.2 mg/dL  0.6  0.8   Alkaline Phos 39 - 117 IU/L  60  50   AST 0 - 40 IU/L  13  15   ALT 0 - 44 IU/L  10  15    Lipid Panel     Component Value Date/Time   CHOL 179 10/08/2014 0912   TRIG 141 10/08/2014 0912   HDL 36 (L) 10/08/2014 0912   CHOLHDL 5.0 10/08/2014 0912   VLDL 28 10/08/2014 0912   LDLCALC 115 (H) 10/08/2014 0912    CBC    Component Value Date/Time   WBC 7.6 05/10/2018 1140   WBC 7.6 01/20/2017 1216   RBC 4.31 05/10/2018 1140   RBC 4.73 01/20/2017 1216   HGB 13.2 05/10/2018 1140   HCT 37.7 05/10/2018 1140   PLT 227 05/10/2018 1140   MCV 88 05/10/2018 1140   MCH 30.6 05/10/2018 1140   MCH 30.7 01/20/2017 1216   MCHC 35.0 05/10/2018 1140   MCHC 34.2 01/20/2017 1216   RDW 13.1 05/10/2018 1140   LYMPHSABS 3.1 05/10/2018 1140   MONOABS 0.6 04/30/2014 1500   EOSABS 0.4 05/10/2018 1140   BASOSABS 0.0 05/10/2018 1140    ASSESSMENT AND PLAN: 1. Hypotension due to medication Likely due to the fact that he took an extra dose of lisinopril hydrochlorothiazide today.  Advised to hold off on taking evening dose of lisinopril/hydrochlorothiazide and carvedilol.  Check blood pressure in the morning before taking  lisinopril/HCTZ. -Advised against taking extra doses of his blood pressure medications without talking to me first. -Advised to drink several glasses of water during the day to stay hydrated. -Patient declines blood test today including CBC, chemistry. -Given that his pulse ox initially was in the low 90s, I wanted to check D-dimer even though my suspicion for PE is low.  Patient declines blood test. 2. Chronic insomnia Good sleep hygiene always encouraged.  Patient is wanting to increase the dose of Ambien.  I advised against doing that and his insurance does not pay for the 6.25 mg of extended release Ambien.  Instead I will write the prescription for current dose of Ambien 5 mg to take 1 nightly as needed with 30 tabs per month.  [place] controlled substance reporting system reviewed.  3. Chronic bilateral low back pain without sciatica This is concerning in this elderly patient.  Recommend imaging study of the lumbar spine.  We will start with plain x-ray first.  Recommend PSA draw but patient declines stating that if he has any underlying cancer he would not want any treatment.  He even declined going to have the x-ray done today downstairs at [place] before he leaves.  He may come back to have it done at a later date. - DG Lumbar Spine Complete; Future  4. Tobacco dependence Advised to quit.  Patient not ready to give a trial of quitting. Discussed lung cancer screening with low-dose CT scan.  Patient declines stating that even if he has any underlying cancer he will never agree to any treatment.  5. Nocturnal foot cramps Declines blood test to assess electrolytes.   Patient was given the opportunity to ask questions.  Patient verbalized understanding of the plan and was able to repeat key elements of the plan.   This documentation was completed using Radio producer.  Any transcriptional errors are unintentional.  Orders Placed This Encounter   Procedures   DG Lumbar Spine Complete     Requested Prescriptions    No prescriptions requested or ordered in this encounter    Return in about 4 months (around 10/03/2022).  Karle Plumber, MD, FACP

## 2022-06-09 ENCOUNTER — Other Ambulatory Visit: Payer: Self-pay | Admitting: Internal Medicine

## 2022-06-09 DIAGNOSIS — N529 Male erectile dysfunction, unspecified: Secondary | ICD-10-CM

## 2022-06-09 NOTE — Telephone Encounter (Signed)
Requested medications are due for refill today.  yes  Requested medications are on the active medications list.  yes  Last refill. 04/09/2021 #10 0 refills  Future visit scheduled.   no  Notes to clinic.  Labs are expired.    Requested Prescriptions  Pending Prescriptions Disp Refills   sildenafil (VIAGRA) 50 MG tablet [Pharmacy Med Name: SILDENAFIL 50 MG TABLET] 10 tablet 0    Sig: TAKE 1 TABLET BY MOUTH 30 MINUTES TO 1 HOUR BEFORE SEXUAL INTERCOURSE. LIMIT TO 1 TABLET PER 24 HOURS     Urology: Erectile Dysfunction Agents Failed - 06/09/2022  4:00 PM      Failed - AST in normal range and within 360 days    AST  Date Value Ref Range Status  05/10/2018 13 0 - 40 IU/L Final         Failed - ALT in normal range and within 360 days    ALT  Date Value Ref Range Status  05/10/2018 10 0 - 44 IU/L Final         Passed - Last BP in normal range    BP Readings from Last 1 Encounters:  06/02/22 90/60         Passed - Valid encounter within last 12 months    Recent Outpatient Visits           1 week ago Hypotension due to medication   [place], Deborah B, MD   4 months ago Essential hypertension   [place], MD   8 months ago Essential hypertension   [place], Deborah B, MD   1 year ago Essential hypertension   [place], Deborah B, MD   1 year ago Need for influenza vaccination   [place], RPH-CPP

## 2022-08-17 ENCOUNTER — Ambulatory Visit
Admission: RE | Admit: 2022-08-17 | Discharge: 2022-08-17 | Disposition: A | Discharge status: Home / Self Care | Payer: Medicare HMO | Source: Ambulatory Visit | Attending: Internal Medicine | Admitting: Internal Medicine

## 2022-08-17 DIAGNOSIS — G8929 Other chronic pain: Secondary | ICD-10-CM

## 2022-08-17 DIAGNOSIS — M545 Low back pain, unspecified: Secondary | ICD-10-CM | POA: Diagnosis not present

## 2022-08-31 ENCOUNTER — Other Ambulatory Visit: Payer: Self-pay | Admitting: Internal Medicine

## 2022-08-31 DIAGNOSIS — I10 Essential (primary) hypertension: Secondary | ICD-10-CM

## 2022-08-31 NOTE — Telephone Encounter (Signed)
Requested medication (s) are due for refill today: yes  Requested medication (s) are on the active medication list: yes  Last refill:  04/05/22 #180/0  Future visit scheduled: no  Notes to clinic:  Unable to refill per protocol due to failed labs, no updated results.    Requested Prescriptions  Pending Prescriptions Disp Refills   carvedilol (COREG) 6.25 MG tablet [Pharmacy Med Name: CARVEDILOL 6.25 MG Tablet] 180 tablet 0    Sig: TAKE 1 TABLET TWICE DAILY WITH MEALS (NEED MD APPOINTMENT)     Cardiovascular: Beta Blockers 3 Failed - 08/31/2022 12:13 PM      Failed - AST in normal range and within 360 days    AST  Date Value Ref Range Status  05/10/2018 13 0 - 40 IU/L Final         Failed - ALT in normal range and within 360 days    ALT  Date Value Ref Range Status  05/10/2018 10 0 - 44 IU/L Final         Passed - Cr in normal range and within 360 days    Creat  Date Value Ref Range Status  01/20/2017 0.89 0.70 - 1.25 mg/dL Final    Comment:      For patients > or = 70 years of age: The upper reference limit for Creatinine is approximately 13% higher for people identified as African-American.      Creatinine, Ser  Date Value Ref Range Status  09/17/2021 1.12 0.76 - 1.27 mg/dL Final         Passed - Last BP in normal range    BP Readings from Last 1 Encounters:  06/02/22 90/60         Passed - Last Heart Rate in normal range    Pulse Readings from Last 1 Encounters:  06/02/22 77         Passed - Valid encounter within last 6 months    Recent Outpatient Visits           3 months ago Hypotension due to medication   [place], Deborah B, MD   7 months ago Essential hypertension   [place], MD   11 months ago Essential hypertension   [place], MD   1 year ago Essential hypertension   [place], MD   2 years ago Need for influenza vaccination   [place], Annie Main L, RPH-CPP               tamsulosin (FLOMAX) 0.4 MG CAPS capsule [Pharmacy Med Name: TAMSULOSIN HYDROCHLORIDE 0.4 MG Capsule] 180 capsule 0    Sig: TAKE 2 CAPSULES BY MOUTH DAILY AFTER SUPPER. (NEED MD APPOINTMENT)     Urology: Alpha-Adrenergic Blocker Failed - 08/31/2022 12:13 PM      Failed - PSA in normal range and within 360 days    PSA  Date Value Ref Range Status  01/20/2017 0.6 <=4.0 ng/mL Final    Comment:      The total PSA value from this assay system is standardized against the WHO standard. The test result will be approximately 20% lower when compared to the equimolar-standardized total PSA (Beckman Coulter). Comparison of serial PSA results should be interpreted with this fact in mind.   This test was performed using the Siemens chemiluminescent method.  Values obtained from different assay methods cannot be used interchangeably. PSA levels, regardless of value, should not be interpreted as absolute evidence of the presence or absence of disease.            Passed - Last BP in normal range    BP Readings from Last 1 Encounters:  06/02/22 90/60         Passed - Valid encounter within last 12 months    Recent Outpatient Visits           3 months ago Hypotension due to medication   [place], Deborah B, MD   7 months ago Essential hypertension   [place], Deborah B, MD   11 months ago Essential hypertension   [place], Deborah B, MD   1 year ago Essential hypertension   [place] Ladell Pier, MD   2 years ago Need for influenza vaccination   [place], Jarome Matin, RPH-CPP                lisinopril-hydrochlorothiazide (ZESTORETIC) 20-12.5 MG tablet [Pharmacy Med Name: LISINOPRIL/HYDROCHLOROTHIAZIDE 20-12.5 MG Tablet] 180 tablet 0    Sig: TAKE 1 TABLET TWICE DAILY (NEED MD APPOINTMENT FOR REFILLS)     Cardiovascular:  ACEI + Diuretic Combos Failed - 08/31/2022 12:13 PM      Failed - Na in normal range and within 180 days    Sodium  Date Value Ref Range Status  09/17/2021 140 134 - 144 mmol/L Final         Failed - K in normal range and within 180 days    Potassium  Date Value Ref Range Status  09/17/2021 4.6 3.5 - 5.2 mmol/L Final         Failed - Cr in normal range and within 180 days    Creat  Date Value Ref Range Status  01/20/2017 0.89 0.70 - 1.25 mg/dL Final    Comment:      For patients > or = 70 years of age: The upper reference limit for Creatinine is approximately 13% higher for people identified as African-American.      Creatinine, Ser  Date Value Ref Range Status  09/17/2021 1.12 0.76 - 1.27 mg/dL Final         Failed - eGFR is 30 or above and within 180 days    GFR, Est African American  Date Value Ref Range Status  01/20/2017 >89 >=60 mL/min Final   GFR calc Af Amer  Date Value Ref Range Status  05/10/2018 91 >59 mL/min/1.73 Final   GFR, Est Non African American  Date Value Ref Range Status  01/20/2017 >89 >=60 mL/min Final   GFR calc non Af Amer  Date Value Ref Range Status  05/10/2018 79 >59 mL/min/1.73 Final   eGFR  Date Value Ref Range Status  09/17/2021 71 >59 mL/min/1.73 Final         Passed - Patient is not pregnant      Passed - Last BP in normal range    BP Readings from Last 1 Encounters:  06/02/22 90/60         Passed - Valid encounter within last 6 months    Recent Outpatient Visits           3 months ago Hypotension due to medication   [facility] Ladell Pier, MD   7 months ago Essential hypertension   [place], MD    11 months ago Essential hypertension   [place], MD   1 year ago Essential hypertension   [place], MD   2 years ago Need for influenza vaccination   [place], RPH-CPP

## 2022-09-09 ENCOUNTER — Other Ambulatory Visit: Payer: Self-pay

## 2022-09-09 DIAGNOSIS — F5104 Psychophysiologic insomnia: Secondary | ICD-10-CM

## 2022-09-09 NOTE — Telephone Encounter (Signed)
Pt requesting 90 day refill of Ambien 5 mg tablets Requested Prescriptions  Pending Prescriptions Disp Refills   zolpidem (AMBIEN) 5 MG tablet 30 tablet 2    Sig: TAKE ONE TABLET BY MOUTH EVERY NIGHT AT BEDTIME AS NEEDED FOR SLEEP Strength: 5 mg     There is no refill protocol information for this order

## 2022-09-09 NOTE — Telephone Encounter (Signed)
Requested medication (s) are due for refill today: yes  Requested medication (s) are on the active medication list: yes  Last refill:  06/02/22 #30 2 RF  Future visit scheduled: no  Notes to clinic:  med not delegated to NT to reorder   Requested Prescriptions  Pending Prescriptions Disp Refills   zolpidem (AMBIEN) 5 MG tablet 30 tablet 2    Sig: TAKE ONE TABLET BY MOUTH EVERY NIGHT AT BEDTIME AS NEEDED FOR SLEEP Strength: 5 mg     Not Delegated - Psychiatry:  Anxiolytics/Hypnotics Failed - 09/09/2022  2:49 PM      Failed - This refill cannot be delegated      Failed - Urine Drug Screen completed in last 360 days      Passed - Valid encounter within last 6 months    Recent Outpatient Visits           3 months ago Hypotension due to medication   [place], Deborah B, MD   7 months ago Essential hypertension   [place], Deborah B, MD   11 months ago Essential hypertension   [place], Deborah B, MD   1 year ago Essential hypertension   [place], Deborah B, MD   2 years ago Need for influenza vaccination   [place], RPH-CPP

## 2022-09-10 MED ORDER — ZOLPIDEM TARTRATE 5 MG PO TABS
5.0000 mg | ORAL_TABLET | Freq: Every evening | ORAL | 2 refills | Status: DC | PRN
  Filled 2022-09-10: qty 30, 30d supply, fill #0

## 2022-09-12 ENCOUNTER — Other Ambulatory Visit: Payer: Self-pay | Admitting: Internal Medicine

## 2022-09-12 ENCOUNTER — Other Ambulatory Visit: Payer: Self-pay

## 2022-09-12 DIAGNOSIS — M545 Low back pain, unspecified: Secondary | ICD-10-CM

## 2022-09-19 ENCOUNTER — Other Ambulatory Visit: Payer: Self-pay

## 2022-09-22 ENCOUNTER — Telehealth: Payer: Self-pay | Admitting: Internal Medicine

## 2022-09-22 DIAGNOSIS — F5104 Psychophysiologic insomnia: Secondary | ICD-10-CM

## 2022-09-22 NOTE — Telephone Encounter (Signed)
Pt states he doesn't use our [employer] and would like to request his zolpidem (AMBIEN) 5 MG tablet  Be sent to:  [facility] [id number] - [address], [place]

## 2022-09-23 MED ORDER — ZOLPIDEM TARTRATE 5 MG PO TABS: 5.0000 mg | ORAL_TABLET | Freq: Every evening | ORAL | 2 refills | Status: DC | PRN

## 2022-09-23 NOTE — Telephone Encounter (Signed)
Dr. Wynetta Emery,   Can we send this to the [facility]?

## 2022-09-23 NOTE — Telephone Encounter (Signed)
Prescription sent to [facility] per patient's request.

## 2022-11-24 ENCOUNTER — Other Ambulatory Visit: Payer: Self-pay | Admitting: Internal Medicine

## 2022-11-24 DIAGNOSIS — N529 Male erectile dysfunction, unspecified: Secondary | ICD-10-CM

## 2022-12-29 ENCOUNTER — Ambulatory Visit: Payer: Medicare HMO | Admitting: Internal Medicine

## 2023-01-12 ENCOUNTER — Telehealth: Payer: Self-pay | Admitting: Internal Medicine

## 2023-01-12 NOTE — Telephone Encounter (Signed)
Left message for patient to call back and schedule Medicare Annual Wellness Visit (AWV) either virtually or phone   Left  my jabber number [phone]  No answer   Last AWV please schedule  07/17/20

## 2023-01-25 ENCOUNTER — Other Ambulatory Visit: Payer: Self-pay | Admitting: Internal Medicine

## 2023-01-25 DIAGNOSIS — I10 Essential (primary) hypertension: Secondary | ICD-10-CM

## 2023-01-25 DIAGNOSIS — N400 Enlarged prostate without lower urinary tract symptoms: Secondary | ICD-10-CM

## 2023-01-25 NOTE — Telephone Encounter (Signed)
Requested medication (s) are due for refill today: yes  Requested medication (s) are on the active medication list: yes  Last refill:  08/31/22  Future visit scheduled: no  Notes to clinic:  Unable to refill per protocol, courtesy refill already given, routing for provider approval.      Requested Prescriptions  Pending Prescriptions Disp Refills   lisinopril-hydrochlorothiazide (ZESTORETIC) 20-12.5 MG tablet [Pharmacy Med Name: LISINOPRIL/HYDROCHLOROTHIAZIDE 20-12.5 MG Tablet] 180 tablet 3    Sig: TAKE 1 TABLET TWICE DAILY (NEED MD APPOINTMENT)     Cardiovascular:  ACEI + Diuretic Combos Failed - 01/25/2023  2:39 AM      Failed - Na in normal range and within 180 days    Sodium  Date Value Ref Range Status  09/17/2021 140 134 - 144 mmol/L Final         Failed - K in normal range and within 180 days    Potassium  Date Value Ref Range Status  09/17/2021 4.6 3.5 - 5.2 mmol/L Final         Failed - Cr in normal range and within 180 days    Creat  Date Value Ref Range Status  01/20/2017 0.89 0.70 - 1.25 mg/dL Final    Comment:      For patients > or = 71 years of age: The upper reference limit for Creatinine is approximately 13% higher for people identified as African-American.      Creatinine, Ser  Date Value Ref Range Status  09/17/2021 1.12 0.76 - 1.27 mg/dL Final         Failed - eGFR is 30 or above and within 180 days    GFR, Est African American  Date Value Ref Range Status  01/20/2017 >89 >=60 mL/min Final   GFR calc Af Amer  Date Value Ref Range Status  05/10/2018 91 >59 mL/min/1.73 Final   GFR, Est Non African American  Date Value Ref Range Status  01/20/2017 >89 >=60 mL/min Final   GFR calc non Af Amer  Date Value Ref Range Status  05/10/2018 79 >59 mL/min/1.73 Final   eGFR  Date Value Ref Range Status  09/17/2021 71 >59 mL/min/1.73 Final         Failed - Valid encounter within last 6 months    Recent Outpatient Visits           7 months  ago Hypotension due to medication   [place], MD   12 months ago Essential hypertension   [place], MD   1 year ago Essential hypertension   [place], MD   2 years ago Essential hypertension   [place] Ladell Pier, MD   2 years ago Need for influenza vaccination   [place], RPH-CPP              Passed - Patient is not pregnant      Passed - Last BP in normal range    BP Readings from Last 1 Encounters:  06/02/22 90/60          tamsulosin (FLOMAX) 0.4 MG CAPS capsule [Pharmacy Med Name: TAMSULOSIN HYDROCHLORIDE 0.4 MG Capsule] 180 capsule 3    Sig: TAKE 2 CAPSULES DAILY AFTER SUPPER. (NEED MD APPOINTMENT)     Urology: Alpha-Adrenergic Blocker Failed -  01/25/2023  2:39 AM      Failed - PSA in normal range and within 360 days    PSA  Date Value Ref Range Status  01/20/2017 0.6 <=4.0 ng/mL Final    Comment:      The total PSA value from this assay system is standardized against the WHO standard. The test result will be approximately 20% lower when compared to the equimolar-standardized total PSA (Beckman Coulter). Comparison of serial PSA results should be interpreted with this fact in mind.   This test was performed using the Siemens chemiluminescent method. Values obtained from different assay methods cannot be used interchangeably. PSA levels, regardless of value, should not be interpreted as absolute evidence of the presence or absence of disease.            Passed - Last BP in normal range    BP Readings from Last 1 Encounters:  06/02/22 90/60         Passed - Valid encounter within last 12 months    Recent Outpatient Visits           7 months ago Hypotension due to medication   [place], MD   12 months ago Essential hypertension   [place], MD   1 year ago Essential hypertension   [place], Deborah B, MD   2 years ago Essential hypertension   [place] Ladell Pier, MD   2 years ago Need for influenza vaccination   [place], Stephen L, RPH-CPP               carvedilol (COREG) 6.25 MG tablet [Pharmacy Med Name: CARVEDILOL 6.25 MG Tablet] 180 tablet 3    Sig: TAKE 1 TABLET TWICE DAILY WITH MEALS (NEED MD APPOINTMENT)     Cardiovascular: Beta Blockers 3 Failed - 01/25/2023  2:39 AM      Failed - Cr in normal range and within 360 days    Creat  Date Value Ref Range Status  01/20/2017 0.89 0.70 - 1.25 mg/dL Final    Comment:      For patients > or = 71 years of age: The upper reference limit for Creatinine is approximately 13% higher for people identified as African-American.      Creatinine, Ser  Date Value Ref Range Status  09/17/2021 1.12 0.76 - 1.27 mg/dL Final         Failed - AST in normal range and within 360 days    AST  Date Value Ref Range Status  05/10/2018 13 0 - 40 IU/L Final         Failed - ALT in normal range and within 360 days    ALT  Date Value Ref Range Status  05/10/2018 10 0 - 44 IU/L Final         Failed - Valid encounter within last 6 months    Recent Outpatient Visits           7 months ago Hypotension due to medication   [place], MD   12 months ago Essential hypertension   [place], MD   1 year ago Essential hypertension   [place], Neoma Laming  B, MD   2 years ago Essential hypertension   [place] Ladell Pier, MD   2 years ago Need for influenza vaccination   [place] BP in normal range    BP Readings from Last 1 Encounters:  06/02/22 90/60         Passed - Last Heart Rate in normal range    Pulse Readings from Last 1 Encounters:  06/02/22 77

## 2023-01-26 ENCOUNTER — Telehealth: Payer: Self-pay | Admitting: Internal Medicine

## 2023-01-30 NOTE — Telephone Encounter (Signed)
Call placed to patient unable to reach message left on VM. If patient call back please see Dr.Johnson note. Patient must be seen before any more refills

## 2023-02-02 ENCOUNTER — Telehealth: Payer: Self-pay | Admitting: Emergency Medicine

## 2023-02-02 NOTE — Telephone Encounter (Signed)
Copied from [place] [id number]. Topic: General - Other >> Feb 01, 2023  4:21 PM Ja-Kwan M wrote: Reason for CRM: Pt returned call and stated he is about to be homeless. Pt  stated he can not afford his apartment and he has to get that taken care of before he can even think about scheduling an appt

## 2023-02-16 ENCOUNTER — Other Ambulatory Visit: Payer: Self-pay | Admitting: Internal Medicine

## 2023-02-16 DIAGNOSIS — N400 Enlarged prostate without lower urinary tract symptoms: Secondary | ICD-10-CM

## 2023-02-16 DIAGNOSIS — I10 Essential (primary) hypertension: Secondary | ICD-10-CM

## 2023-03-23 ENCOUNTER — Other Ambulatory Visit: Payer: Self-pay | Admitting: Internal Medicine

## 2023-03-23 DIAGNOSIS — I10 Essential (primary) hypertension: Secondary | ICD-10-CM

## 2023-03-23 DIAGNOSIS — N400 Enlarged prostate without lower urinary tract symptoms: Secondary | ICD-10-CM

## 2023-03-23 MED ORDER — CARVEDILOL 6.25 MG PO TABS: 6.2500 mg | ORAL_TABLET | Freq: Two times a day (BID) | ORAL | 1 refills | Status: DC

## 2023-03-23 MED ORDER — LISINOPRIL-HYDROCHLOROTHIAZIDE 20-12.5 MG PO TABS: 1.0000 | ORAL_TABLET | Freq: Two times a day (BID) | ORAL | 1 refills | Status: DC

## 2023-03-23 MED ORDER — TAMSULOSIN HCL 0.4 MG PO CAPS: 0.8000 mg | ORAL_CAPSULE | Freq: Every day | ORAL | 1 refills | Status: DC

## 2023-03-29 ENCOUNTER — Other Ambulatory Visit: Payer: Self-pay | Admitting: Internal Medicine

## 2023-03-29 DIAGNOSIS — F5104 Psychophysiologic insomnia: Secondary | ICD-10-CM

## 2023-03-29 NOTE — Telephone Encounter (Signed)
Requested medication (s) are due for refill today -yes  Requested medication (s) are on the active medication list -yes  Future visit scheduled -yes  Last refill: 09/23/22 #30 2RF  Notes to clinic: non delegated Rx  Requested Prescriptions  Pending Prescriptions Disp Refills   zolpidem (AMBIEN) 5 MG tablet [Pharmacy Med Name: ZOLPIDEM TARTRATE 5 MG TABLET] 30 tablet     Sig: TAKE ONE TABLET BY MOUTH AT BEDTIME AS NEEDED     Not Delegated - Psychiatry:  Anxiolytics/Hypnotics Failed - 03/29/2023 12:12 PM      Failed - This refill cannot be delegated      Failed - Urine Drug Screen completed in last 360 days      Failed - Valid encounter within last 6 months    Recent Outpatient Visits           10 months ago Hypotension due to medication   [place] [facility] Marcine Matar, MD   1 year ago Essential hypertension   [facility] Marcine Matar, MD   1 year ago Essential hypertension   [place] [facility] Marcine Matar, MD   2 years ago Essential hypertension   [facility] [place] [facility] Marcine Matar, MD   2 years ago Need for influenza vaccination   [facility] [place]illa [place]et, RPH-CPP       Future Appointments             In 1 month Laural Benes, Binnie Rail, MD [facility]               Requested Prescriptions  Pending Prescriptions Disp Refills   zolpidem (AMBIEN) 5 MG tablet [Pharmacy Med Name: ZOLPIDEM TARTRATE 5 MG TABLET] 30 tablet     Sig: TAKE ONE TABLET BY MOUTH AT BEDTIME AS NEEDED     Not Delegated - Psychiatry:  Anxiolytics/Hypnotics Failed - 03/29/2023 12:12 PM      Failed - This refill cannot be delegated      Failed - Urine Drug Screen completed in last 360 days      Failed - Valid encounter within last 6 months    Recent Outpatient Visits            10 months ago Hypotension due to medication   [facility] Marcine Matar, MD   1 year ago Essential hypertension   [place] [facility] Marcine Matar, MD   1 year ago Essential hypertension   [facility] Marcine Matar, MD   2 years ago Essential hypertension   [place] [facility] Marcine Matar, MD   2 years ago Need for influenza vaccination   [facility] Drucilla Chalet, RPH-CPP       Future Appointments             In 1 month Laural Benes, Binnie Rail, MD [facility]

## 2023-04-07 ENCOUNTER — Telehealth: Payer: Self-pay | Admitting: Internal Medicine

## 2023-04-07 NOTE — Telephone Encounter (Signed)
Copied from CRM [id number]. Topic: Medicare AWV >> Apr 07, 2023  3:16 PM Rushie Goltz wrote: Reason for CRM: Called patient to schedule Medicare Annual Wellness Visit (AWV). Left message for patient to call back and schedule Medicare Annual Wellness Visit (AWV).  Last date of AWV: 07/17/2020  Please schedule an AWVS appointment at any time with [facility] VISIT.  If any questions, please contact me at [phone].    Thank you,  [facility] Support [facility] Direct dial  [phone]

## 2023-04-19 ENCOUNTER — Other Ambulatory Visit: Payer: Self-pay | Admitting: Internal Medicine

## 2023-04-19 DIAGNOSIS — F5104 Psychophysiologic insomnia: Secondary | ICD-10-CM

## 2023-05-04 ENCOUNTER — Ambulatory Visit: Payer: Medicare HMO | Attending: Internal Medicine | Admitting: Internal Medicine

## 2023-05-04 ENCOUNTER — Encounter: Payer: Self-pay | Admitting: Internal Medicine

## 2023-05-04 VITALS — BP 157/82 | HR 80 | Temp 98.3°F | Ht 70.0 in | Wt 183.0 lb

## 2023-05-04 DIAGNOSIS — K7460 Unspecified cirrhosis of liver: Secondary | ICD-10-CM

## 2023-05-04 DIAGNOSIS — F17200 Nicotine dependence, unspecified, uncomplicated: Secondary | ICD-10-CM

## 2023-05-04 DIAGNOSIS — Z5320 Procedure and treatment not carried out because of patient's decision for unspecified reasons: Secondary | ICD-10-CM | POA: Diagnosis not present

## 2023-05-04 DIAGNOSIS — Z1331 Encounter for screening for depression: Secondary | ICD-10-CM | POA: Diagnosis not present

## 2023-05-04 DIAGNOSIS — D229 Melanocytic nevi, unspecified: Secondary | ICD-10-CM | POA: Diagnosis not present

## 2023-05-04 DIAGNOSIS — L918 Other hypertrophic disorders of the skin: Secondary | ICD-10-CM | POA: Diagnosis not present

## 2023-05-04 DIAGNOSIS — F5104 Psychophysiologic insomnia: Secondary | ICD-10-CM

## 2023-05-04 DIAGNOSIS — M545 Low back pain, unspecified: Secondary | ICD-10-CM | POA: Diagnosis not present

## 2023-05-04 DIAGNOSIS — I10 Essential (primary) hypertension: Secondary | ICD-10-CM

## 2023-05-04 DIAGNOSIS — G8929 Other chronic pain: Secondary | ICD-10-CM

## 2023-05-04 MED ORDER — ZOLPIDEM TARTRATE 5 MG PO TABS: 5.0000 mg | ORAL_TABLET | Freq: Every evening | ORAL | 5 refills | Status: DC | PRN

## 2023-05-04 MED ORDER — LISINOPRIL-HYDROCHLOROTHIAZIDE 20-12.5 MG PO TABS: 1.0000 | ORAL_TABLET | Freq: Two times a day (BID) | ORAL | 1 refills | Status: DC

## 2023-05-04 MED ORDER — CARVEDILOL 6.25 MG PO TABS: 6.2500 mg | ORAL_TABLET | Freq: Two times a day (BID) | ORAL | 1 refills | Status: DC

## 2023-05-04 NOTE — Patient Instructions (Signed)
Try using Salonpos or Voltaren Gel over the counter on your lower back

## 2023-05-04 NOTE — Progress Notes (Signed)
Patient ID: [id number] ZARN, male    DOB: 08-Jul-1951  MRN: [id number]   CC: Hypertension (HTN. Med refill - requesting rxing for a longer time. /Intermittent pain on L arm due to previous injury X1 yr/Skin tag on scalp./No to colonoscopy. No to shingles vax.)     Subjective: James Avila is a 72 y.o. 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    HM: Patient declines shingles vaccine and colon cancer screening.   HTN: Patient is on lisinopril/HCTZ 20/12.5 mg twice a day, carvedilol 6.25 mg twice a day.  Compliant with meds Checks BP 2 x a wk.  Last 24 hrs readings:  139/89, 129/75, 117/78. Reports readings have been much better Not limiting salt in foods.  "I want to enjoy my life as much as I can." No CP/SOB   Tob dep:  smoking 3/4 pk a day.  Would like to quit but too hard.  Reports he wants to enjoy his life.  Declines screening for lung CA.    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  One on scalp present 1.5 yr and changes in size   Insomnia:  takes Ambien every night.  Requests refill.  Doing well with the Ambien.  No significant side effects.   Still bothered with lower back pain with standing, sitting, laying down.  Stiff when he bends over.  Uses heating pad which helps.  Worked Holiday representative for 14 yrs Lumbar X-ray done last year showed: IMPRESSION: 1. No fracture or acute finding.  No spondylolisthesis. 2. Mild dextroscoliosis of the lower lumbar spine. 3. Disc degenerative changes L4-L5 and L5-S1 and mildly at L2-L3.  Referred for P.T but did not go.  Reports he can look up on line.  Thinks it is more so the muscles  Patient with positive depression screen.  He states that this is not a major issue for him.  States  that he has days where he feels depressed but does not persist.  Had issues with depression off and on since the age of 22.  Denies any suicidal ideation.     Patient Active Problem List    Diagnosis Date Noted   Colon cancer screening declined 07/17/2020   Lung cancer screening declined by patient 07/17/2020   Headache syndrome 03/19/2020   Hepatitis C virus infection cured after antiviral drug therapy 02/05/2019   Panic disorder 02/05/2019   Nodule of shaft of penis 06/02/2017   Seborrheic keratoses 05/02/2017   Insomnia 04/06/2017   Plantar fasciitis, left 03/06/2017   Deltoid tendonitis, unspecified laterality 01/20/2017   Impotence 04/11/2016   Plantar wart of left foot 04/11/2016   Vitamin D deficiency 04/08/2016   Hepatic cirrhosis (HCC) 07/31/2014   BPH (benign prostatic hyperplasia) 10/10/2013   Anxiety state, unspecified 10/10/2013   Chronic back pain 10/10/2013   TOBACCO USE 09/07/2007   Hepatitis C virus infection without hepatic coma 05/10/2007   Essential hypertension 05/10/2007            Current Outpatient Medications on File Prior to Visit  Medication Sig Dispense Refill   aspirin 81 MG tablet Take 1 tablet (81 mg total) by mouth daily. 90 tablet 3   carvedilol (COREG) 6.25 MG tablet Take  1 tablet (6.25 mg total) by mouth 2 (two) times daily with a meal. 60 tablet 1   Cholecalciferol (VITAMIN D3 PO) Take 5,000 mg by mouth daily.       lisinopril-hydrochlorothiazide (ZESTORETIC) 20-12.5 MG tablet Take 1 tablet by mouth in the morning and at bedtime. 60 tablet 1   sildenafil (VIAGRA) 50 MG tablet TAKE 1 TABLET BY MOUTH 30 MINUTES TO 1 HOUR BEFORE SEXUAL INTERCOURSE.  LIMIT TO 1 TABLET PER 24 HOURS 10 tablet 0   tamsulosin (FLOMAX) 0.4 MG CAPS capsule Take 2 capsules (0.8 mg total) by mouth daily after supper. 60 capsule 1   zolpidem (AMBIEN) 5 MG tablet TAKE ONE TABLET BY MOUTH AT BEDTIME AS NEEDED 15 tablet 0   butalbital-acetaminophen-caffeine (FIORICET) 50-325-40  MG tablet Take 2 tablets by mouth daily as needed for headache. 20 tablet 0    No current facility-administered medications on file prior to visit.           Allergies  Allergen Reactions   Hydralazine        Causes numbness Increased blood pressure      Social History         Socioeconomic History   Marital status: Divorced      Spouse name: Not on file   Number of children: Not on file   Years of education: Not on file   Highest education level: 12th grade  Occupational History   Not on file  Tobacco Use   Smoking status: Every Day      Packs/day: .5      Types: Cigarettes   Smokeless tobacco: Never   Tobacco comments:      Smoking 5-10 cigs/day  Vaping Use   Vaping Use: Never used  Substance and Sexual Activity   Alcohol use: Yes      Alcohol/week: 4.0 standard drinks of alcohol      Types: 4 Cans of beer per week      Comment: weekly   Drug use: No   Sexual activity: Not on file  Other Topics Concern   Not on file  Social History Narrative   Not on file    Social Determinants of Health        Financial Resource Strain: Medium Risk (04/30/2023)    Overall Financial Resource Strain (CARDIA)     Difficulty of Paying Living Expenses: Somewhat hard  Food Insecurity: Food Insecurity Present (04/30/2023)    Hunger Vital Sign     Worried About Running Out of Food in the Last Year: Sometimes true     Ran Out of Food in the Last Year: Sometimes true  Transportation Needs: No Transportation Needs (04/30/2023)    PRAPARE - Therapist, art (Medical): No     Lack of Transportation (Non-Medical): No  Physical Activity: Sufficiently Active (04/30/2023)    Exercise Vital Sign     Days of Exercise per Week: 2 days     Minutes of Exercise per Session: 90 min  Stress: Stress Concern Present (04/30/2023)    [employer] of Occupational Health - Occupational Stress Questionnaire     Feeling of Stress : Rather much  Social Connections: Socially  Isolated (04/30/2023)    Social Connection and Isolation Panel [NHANES]     Frequency of Communication with Friends and Family: Once a week     Frequency of Social Gatherings with Friends and Family: Once a week     Attends Religious Services: Never  Active Member of Clubs or Organizations: No     Attends Engineer, structural: Not on file     Marital Status: Divorced  Intimate Partner Violence: Not on file           Family History  Problem Relation Age of Onset   Hypertension Mother     Hydrocephalus Mother     Stroke Mother     Diabetes Mother     Cancer Father 29        skin            Past Surgical History:  Procedure Laterality Date   TONSILLECTOMY          ROS: Review of Systems Negative except as stated above   PHYSICAL EXAM: BP (!) 157/82 (BP Location: Left Arm, Patient Position: Sitting, Cuff Size: Normal)   Pulse 80   Temp 98.3 F (36.8 C) (Oral)   Ht 5\' 10"  (1.778 m)   Wt 183 lb (83 kg)   SpO2 95%   BMI 26.26 kg/m   Physical Exam    General appearance - alert, well appearing, older Caucasian male and in no distress Mental status - normal mood, behavior, speech, dress, motor activity, and thought processes Chest - clear to auscultation, no wheezes, rales or rhonchi, symmetric air entry Heart - normal rate, regular rhythm, normal S1, S2, no murmurs, rubs, clicks or gallops Musculoskeletal -patient is wearing a back brace Extremities - peripheral pulses normal, no pedal edema, no clubbing or cyanosis Skin: Patient has male pattern baldness.  He has freckles on the scalp and forehead.  Lesion on the scalp that he is concerned about is a white hornlike lesion about 0.3 cm in size. Several seborrheic keratosis lesion on the posterior and anterior thorax.  He has a large skin tag that is about half a centimeter in size in the lower right axilla and lateral to the right breast.  Has a mole that is about 0.5 cm below the left collarbone.     05/04/2023     2:04 PM 09/17/2021    2:50 PM 07/17/2020    3:04 PM  Depression screen PHQ 2/9  Decreased Interest 1 2 1   Down, Depressed, Hopeless 1 2 1   PHQ - 2 Score 2 4 2   Altered sleeping 2 3 2   Tired, decreased energy 2 2 2   Change in appetite 0 1 0  Feeling bad or failure about yourself  2 2 1   Trouble concentrating 2 1 2   Moving slowly or fidgety/restless 0 0 0  Suicidal thoughts 0 0 1  PHQ-9 Score 10 13 10         Latest Ref Rng & Units 09/17/2021    3:22 PM 05/10/2018   11:40 AM 01/20/2017   12:16 PM  CMP  Glucose 70 - 99 mg/dL 97  161  98   BUN 8 - 27 mg/dL 25  17  16    Creatinine 0.76 - 1.27 mg/dL 0.96  0.45  4.09   Sodium 134 - 144 mmol/L 140  140  139   Potassium 3.5 - 5.2 mmol/L 4.6  3.6  3.7   Chloride 96 - 106 mmol/L 101  99  102   CO2 20 - 29 mmol/L 25  26  27    Calcium 8.6 - 10.2 mg/dL 9.3  9.2  9.2   Total Protein 6.0 - 8.5 g/dL   7.0  7.2   Total Bilirubin 0.0 - 1.2 mg/dL   0.6  0.8  Alkaline Phos 39 - 117 IU/L   60  50   AST 0 - 40 IU/L   13  15   ALT 0 - 44 IU/L   10  15     Lipid Panel  Labs (Brief)          Component Value Date/Time    CHOL 179 10/08/2014 0912    TRIG 141 10/08/2014 0912    HDL 36 (L) 10/08/2014 0912    CHOLHDL 5.0 10/08/2014 0912    VLDL 28 10/08/2014 0912    LDLCALC 115 (H) 10/08/2014 0912        CBC Labs (Brief)          Component Value Date/Time    WBC 7.6 05/10/2018 1140    WBC 7.6 01/20/2017 1216    RBC 4.31 05/10/2018 1140    RBC 4.73 01/20/2017 1216    HGB 13.2 05/10/2018 1140    HCT 37.7 05/10/2018 1140    PLT 227 05/10/2018 1140    MCV 88 05/10/2018 1140    MCH 30.6 05/10/2018 1140    MCH 30.7 01/20/2017 1216    MCHC 35.0 05/10/2018 1140    MCHC 34.2 01/20/2017 1216    RDW 13.1 05/10/2018 1140    LYMPHSABS 3.1 05/10/2018 1140    MONOABS 0.6 04/30/2014 1500    EOSABS 0.4 05/10/2018 1140    BASOSABS 0.0 05/10/2018 1140        ASSESSMENT AND PLAN:  1. Essential hypertension Not at goal but patient's home  blood pressure readings have been good.  We will have him continue his current medications.  Strongly encouraged DASH diet. - carvedilol (COREG) 6.25 MG tablet; Take 1 tablet (6.25 mg total) by mouth 2 (two) times daily with a meal.  Dispense: 180 tablet; Refill: 1 - lisinopril-hydrochlorothiazide (ZESTORETIC) 20-12.5 MG tablet; Take 1 tablet by mouth in the morning and at bedtime.  Dispense: 180 tablet; Refill: 1 - CBC - Comprehensive metabolic panel - Lipid panel  2. Chronic insomnia Refill sent on Ambien.  He is tolerating the medication without significant side effects.  [place] controlled substance reporting system reviewed. - zolpidem (AMBIEN) 5 MG tablet; Take 1 tablet (5 mg total) by mouth at bedtime as needed.  Dispense: 30 tablet; Refill: 5  3. Cirrhosis of liver without ascites, unspecified hepatic cirrhosis type (HCC) Associated with hepatitis C for which she obtained acute or.  Patient is not interested in surveillance ultrasound for [facility].  4. Tobacco dependence Strongly advised to quit.  Patient not ready to give a trial of quitting.  He declines lung cancer screening with low-dose CT scan.  5. Chronic bilateral low back pain without sciatica We discussed the option of doing more advanced imaging but patient declines.  Declines referral to physical therapy stating that he will look online to find some exercises that may help.  I recommend trial of over-the-counter Salonpas or Voltaren gel.  6. Positive depression screening Patient reports this is not a major issue for him.  Does not feel that he needs to be on any medications or counseling at this time.  7. Skin tag 8. Atypical mole Recommended referral to dermatology.  Patient declines  9. Colon cancer screening declined Strongly recommended.  Patient declines colonoscopy and noninvasive testing.         Patient was given the opportunity to ask questions.  Patient verbalized understanding of the plan and was  able to repeat key elements of the plan.    This  documentation was completed using Paediatric nurse.  Any transcriptional errors are unintentional.   No orders of the defined types were placed in this encounter.       Requested Prescriptions          Pending Prescriptions Disp Refills   zolpidem (AMBIEN) 5 MG tablet 15 tablet 0      Sig: Take 1 tablet (5 mg total) by mouth at bedtime as needed.      No follow-ups on file.   Jonah Blue, MD, FACP

## 2023-05-05 LAB — COMPREHENSIVE METABOLIC PANEL
ALT: 18 IU/L (ref 0–44)
AST: 15 IU/L (ref 0–40)
Albumin/Globulin Ratio: 1.8 (ref 1.2–2.2)
Albumin: 4.6 g/dL (ref 3.9–4.9)
Alkaline Phosphatase: 59 IU/L (ref 44–121)
BUN/Creatinine Ratio: 18 (ref 10–24)
BUN: 19 mg/dL (ref 8–27)
Bilirubin Total: 0.8 mg/dL (ref 0.0–1.2)
CO2: 27 mmol/L (ref 20–29)
Calcium: 9.2 mg/dL (ref 8.6–10.2)
Chloride: 101 mmol/L (ref 96–106)
Creatinine, Ser: 1.05 mg/dL (ref 0.76–1.27)
Globulin, Total: 2.5 g/dL (ref 1.5–4.5)
Glucose: 89 mg/dL (ref 70–99)
Potassium: 4 mmol/L (ref 3.5–5.2)
Sodium: 141 mmol/L (ref 134–144)
Total Protein: 7.1 g/dL (ref 6.0–8.5)
eGFR: 76 mL/min/{1.73_m2} (ref >59)

## 2023-05-05 LAB — LIPID PANEL
Chol/HDL Ratio: 4.3 ratio (ref 0.0–5.0)
Cholesterol, Total: 172 mg/dL (ref 100–199)
HDL: 40 mg/dL (ref >39)
LDL Chol Calc (NIH): 87 mg/dL (ref 0–99)
Triglycerides: 273 mg/dL — ABNORMAL HIGH (ref 0–149)
VLDL Cholesterol Cal: 45 mg/dL — ABNORMAL HIGH (ref 5–40)

## 2023-05-05 LAB — CBC
Hematocrit: 43.5 % (ref 37.5–51.0)
Hemoglobin: 15.3 g/dL (ref 13.0–17.7)
MCH: 31.7 pg (ref 26.6–33.0)
MCHC: 35.2 g/dL (ref 31.5–35.7)
MCV: 90 fL (ref 79–97)
Platelets: 219 10*3/uL (ref 150–450)
RBC: 4.83 x10E6/uL (ref 4.14–5.80)
RDW: 12.6 % (ref 11.6–15.4)
WBC: 8.7 10*3/uL (ref 3.4–10.8)

## 2023-05-10 ENCOUNTER — Other Ambulatory Visit: Payer: Self-pay | Admitting: Internal Medicine

## 2023-05-10 DIAGNOSIS — N400 Enlarged prostate without lower urinary tract symptoms: Secondary | ICD-10-CM

## 2023-05-10 DIAGNOSIS — I10 Essential (primary) hypertension: Secondary | ICD-10-CM

## 2023-06-07 ENCOUNTER — Other Ambulatory Visit (HOSPITAL_BASED_OUTPATIENT_CLINIC_OR_DEPARTMENT_OTHER): Payer: Medicare HMO | Admitting: Pharmacist

## 2023-06-07 DIAGNOSIS — Z79899 Other long term (current) drug therapy: Secondary | ICD-10-CM

## 2023-06-07 NOTE — Progress Notes (Signed)
Pharmacy Quality Measure Review  This patient is appearing on a report for being at risk of failing the adherence measure for hypertension (ACEi/ARB) medications this calendar year.   Medication: lisinopril-hydrochlorothiazide 20-12.5mg  BID Last fill date: 05/12/2023 for 90 day supply. Fills look current and are for 90-day supplies. No further action needed at this time.   Butch Penny, PharmD, P[place]y [place], CPP Clinical Pharmacist [facility] [phone]

## 2023-06-13 ENCOUNTER — Ambulatory Visit: Payer: Medicare HMO | Attending: Internal Medicine

## 2023-06-13 VITALS — Ht 70.0 in | Wt 183.0 lb

## 2023-06-13 DIAGNOSIS — Z0000 Encounter for general adult medical examination without abnormal findings: Secondary | ICD-10-CM | POA: Diagnosis not present

## 2023-06-13 NOTE — Progress Notes (Signed)
Subjective:   James Avila is a 72 y.o. male who presents for Medicare Annual/Subsequent preventive examination.  Visit Complete: Virtual  I connected with  [name] on 06/13/23 by a audio enabled telemedicine application and verified that I am speaking with the correct person using two identifiers.  Patient Location: Home  Provider Location: Home Office  I discussed the limitations of evaluation and management by telemedicine. The patient expressed understanding and agreed to proceed.  Review of Systems     Cardiac Risk Factors include: advanced age (>73men, >18 women);male gender;hypertension  Per patient no change in vitals since last visit, unable to obtain new vitals due to telehealth visit     Objective:    Today's Vitals   06/13/23 1845  Weight: 183 lb (83 kg)  Height: 5\' 10"  (1.778 m)   Body mass index is 26.26 kg/m.     06/13/2023    6:59 PM 07/17/2020    3:09 PM 07/03/2017   11:24 AM 06/02/2017    2:40 PM 05/02/2017    1:39 PM 04/04/2017    2:35 PM 03/06/2017   11:37 AM  Advanced Directives  Does Patient Have a Medical Advance Directive? No No No No No No No  Would patient like information on creating a medical advance directive? Yes (MAU/Ambulatory/Procedural Areas - Information given) Yes (Inpatient - patient defers creating a medical advance directive at this time - Information given)         Current Medications (verified) Outpatient Encounter Medications as of 06/13/2023  Medication Sig   aspirin 81 MG tablet Take 1 tablet (81 mg total) by mouth daily.   carvedilol (COREG) 6.25 MG tablet TAKE 1 TABLET TWICE DAILY WITH MEALS   Cholecalciferol (VITAMIN D3 PO) Take 5,000 mg by mouth daily.   lisinopril-hydrochlorothiazide (ZESTORETIC) 20-12.5 MG tablet TAKE 1 TABLET IN THE MORNING AND AT BEDTIME   sildenafil (VIAGRA) 50 MG tablet TAKE 1 TABLET BY MOUTH 30 MINUTES TO 1 HOUR BEFORE SEXUAL INTERCOURSE.  LIMIT TO 1 TABLET PER 24 HOURS   tamsulosin (FLOMAX) 0.4  MG CAPS capsule TAKE 2 CAPSULES EVERY DAY AFTER SUPPER   zolpidem (AMBIEN) 5 MG tablet Take 1 tablet (5 mg total) by mouth at bedtime as needed.   No facility-administered encounter medications on file as of 06/13/2023.    Allergies (verified) Hydralazine   History: Past Medical History:  Diagnosis Date   Anxiety state, unspecified 10/10/2013   BPH (benign prostatic hyperplasia) 10/10/2013   HEPATITIS C 05/10/2007   Qualifier: Diagnosis of  By: Cato Mulligan MD, Bruce     Hypertension    TOBACCO USE 09/07/2007   Qualifier: Diagnosis of  By: Cato Mulligan MD, Bruce     Past Surgical History:  Procedure Laterality Date   TONSILLECTOMY     Family History  Problem Relation Age of Onset   Hypertension Mother    Hydrocephalus Mother    Stroke Mother    Diabetes Mother    Cancer Father 66       skin    Social History   Socioeconomic History   Marital status: Divorced    Spouse name: Not on file   Number of children: Not on file   Years of education: Not on file   Highest education level: 12th grade  Occupational History   Not on file  Tobacco Use   Smoking status: Every Day    Current packs/day: 0.50    Types: Cigarettes   Smokeless tobacco: Never   Tobacco  comments:    Smoking 5-10 cigs/day  Vaping Use   Vaping status: Never Used  Substance and Sexual Activity   Alcohol use: Yes    Alcohol/week: 4.0 standard drinks of alcohol    Types: 4 Cans of beer per week    Comment: weekly   Drug use: No   Sexual activity: Not on file  Other Topics Concern   Not on file  Social History Narrative   Not on file   Social Determinants of Health   Financial Resource Strain: Low Risk  (06/13/2023)   Overall Financial Resource Strain (CARDIA)    Difficulty of Paying Living Expenses: Not very hard  Recent Concern: Financial Resource Strain - Medium Risk (04/30/2023)   Overall Financial Resource Strain (CARDIA)    Difficulty of Paying Living Expenses: Somewhat hard  Food Insecurity: Food  Insecurity Present (06/13/2023)   Hunger Vital Sign    Worried About Running Out of Food in the Last Year: Sometimes true    Ran Out of Food in the Last Year: Sometimes true  Transportation Needs: No Transportation Needs (06/13/2023)   PRAPARE - Administrator, Civil Service (Medical): No    Lack of Transportation (Non-Medical): No  Physical Activity: Insufficiently Active (06/13/2023)   Exercise Vital Sign    Days of Exercise per Week: 3 days    Minutes of Exercise per Session: 30 min  Stress: No Stress Concern Present (06/13/2023)   [employer] of Occupational Health - Occupational Stress Questionnaire    Feeling of Stress : Not at all  Recent Concern: Stress - Stress Concern Present (04/30/2023)   [employer] of Occupational Health - Occupational Stress Questionnaire    Feeling of Stress : Rather much  Social Connections: Socially Isolated (06/13/2023)   Social Connection and Isolation Panel [NHANES]    Frequency of Communication with Friends and Family: Once a week    Frequency of Social Gatherings with Friends and Family: Once a week    Attends Religious Services: Never    Database administrator or Organizations: No    Attends Engineer, structural: Never    Marital Status: Divorced    Tobacco Counseling Ready to quit: Not Answered Counseling given: Not Answered Tobacco comments: Smoking 5-10 cigs/day   Clinical Intake:  Pre-visit preparation completed: Yes  Pain : No/denies pain     Diabetes: No  How often do you need to have someone help you when you read instructions, pamphlets, or other written materials from your doctor or pharmacy?: 1 - Never  Interpreter Needed?: No  Information entered by :: Kandis Fantasia LPN   Activities of Daily Living    06/13/2023    6:46 PM  In your present state of health, do you have any difficulty performing the following activities:  Hearing? 0  Vision? 0  Difficulty concentrating or making  decisions? 0  Walking or climbing stairs? 0  Dressing or bathing? 0  Doing errands, shopping? 0  Preparing Food and eating ? N  Using the Toilet? N  In the past six months, have you accidently leaked urine? N  Do you have problems with loss of bowel control? N  Managing your Medications? N  Managing your Finances? N  Housekeeping or managing your Housekeeping? N    Patient Care Team: Marcine Matar, MD as PCP - General (Internal Medicine)  Indicate any recent Medical Services you may have received from other than [facility] providers in the past year (date may be  approximate).     Assessment:   This is a routine wellness examination for Justino.  Hearing/Vision screen Hearing Screening - Comments:: Denies hearing difficulties   Vision Screening - Comments:: Wears rx glasses - up to date with routine eye exams    Dietary issues and exercise activities discussed:     Goals Addressed             This Visit's Progress    Remain active and independent         Depression Screen    06/13/2023    7:02 PM 05/04/2023    2:04 PM 09/17/2021    2:50 PM 07/17/2020    3:04 PM 06/11/2020    2:51 PM 08/28/2019   12:00 PM 02/05/2019   11:02 AM  PHQ 2/9 Scores  PHQ - 2 Score 2 2 4 2 3  0 4  PHQ- 9 Score 10 10 13 10 10  0 11    Fall Risk    06/13/2023    7:04 PM 05/04/2023    2:01 PM 01/26/2022    2:37 PM 09/17/2021    2:50 PM 11/16/2020    3:39 PM  Fall Risk   Falls in the past year? 0 0 0 0 0  Number falls in past yr: 0 0 0 0 0  Injury with Fall? 0 0 0 0 0  Risk for fall due to : No Fall Risks No Fall Risks No Fall Risks No Fall Risks   Follow up Falls prevention discussed;Education provided;Falls evaluation completed        MEDICARE RISK AT HOME:  Medicare Risk at Home - 06/13/23 1905     Any stairs in or around the home? No    If so, are there any without handrails? No    Home free of loose throw rugs in walkways, pet beds, electrical cords, etc? Yes    Adequate lighting in  your home to reduce risk of falls? Yes    Life alert? No    Use of a cane, walker or w/c? No    Grab bars in the bathroom? Yes    Shower chair or bench in shower? No    Elevated toilet seat or a handicapped toilet? No             TIMED UP AND GO:  Was the test performed?  No    Cognitive Function:    07/17/2020    3:05 PM  MMSE - Mini Mental State Exam  Orientation to time 5  Orientation to Place 5  Registration 3  Attention/ Calculation 5  Recall 3  Language- name 2 objects 2  Language- repeat 1  Language- follow 3 step command 3  Language- read & follow direction 1  Write a sentence 1  Copy design 1  Total score 30        06/13/2023    7:05 PM  6CIT Screen  What Year? 0 points  What month? 0 points  What time? 0 points  Count back from 20 0 points  Months in reverse 0 points  Repeat phrase 0 points  Total Score 0 points    Immunizations Immunization History  Administered Date(s) Administered   Hepatitis A, Adult 07/31/2014   Influenza,inj,Quad PF,6+ Mos 11/12/2019, 07/17/2020, 09/17/2021   PFIZER(Purple Top)SARS-COV-2 Vaccination 01/30/2020, 02/20/2020   PNEUMOCOCCAL CONJUGATE-20 09/17/2021   Pneumococcal Conjugate-13 11/12/2019   Pneumococcal Polysaccharide-23 07/31/2014   Tdap 08/05/2015    TDAP status: Up to date  Pneumococcal vaccine status:  Up to date  Covid-19 vaccine status: Information provided on how to obtain vaccines.   Qualifies for Shingles Vaccine? Yes   Zostavax completed No   Shingrix Completed?: No.    Education has been provided regarding the importance of this vaccine. Patient has been advised to call insurance company to determine out of pocket expense if they have not yet received this vaccine. Advised may also receive vaccine at local pharmacy or Health Dept. Verbalized acceptance and understanding.  Screening Tests Health Maintenance  Topic Date Due   COVID-19 Vaccine (3 - Pfizer risk series) 03/19/2020   INFLUENZA  VACCINE  06/29/2023   Medicare Annual Wellness (AWV)  06/12/2024   DTaP/Tdap/Td (2 - Td or Tdap) 08/04/2025   Pneumonia Vaccine 84+ Years old  Completed   Hepatitis C Screening  Completed   HPV VACCINES  Aged Out   Colonoscopy  Discontinued   Zoster Vaccines- Shingrix  Discontinued    Health Maintenance  Health Maintenance Due  Topic Date Due   COVID-19 Vaccine (3 - Pfizer risk series) 03/19/2020    Colorectal cancer screening: Patient declines   Lung Cancer Screening: (Low Dose CT Chest recommended if Age 42-80 years, 20 pack-year currently smoking OR have quit w/in 15years.) does not qualify.   Lung Cancer Screening Referral: n/a  Additional Screening:  Hepatitis C Screening: does qualify; Completed 04/08/16  Vision Screening: Recommended annual ophthalmology exams for early detection of glaucoma and other disorders of the eye. Is the patient up to date with their annual eye exam?  Yes  Who is the provider or what is the name of the office in which the patient attends annual eye exams? Unable to provide name  If pt is not established with a provider, would they like to be referred to a provider to establish care? No .   Dental Screening: Recommended annual dental exams for proper oral hygiene  Community Resource Referral / Chronic Care Management: CRR required this visit?  No   CCM required this visit?  No     Plan:     I have personally reviewed and noted the following in the patient's chart:   Medical and social history Use of alcohol, tobacco or illicit drugs  Current medications and supplements including opioid prescriptions. Patient is not currently taking opioid prescriptions. Functional ability and status Nutritional status Physical activity Advanced directives List of other physicians Hospitalizations, surgeries, and ER visits in previous 12 months Vitals Screenings to include cognitive, depression, and falls Referrals and appointments  In addition,  I have reviewed and discussed with patient certain preventive protocols, quality metrics, and best practice recommendations. A written personalized care plan for preventive services as well as general preventive health recommendations were provided to patient.     Kandis Fantasia [place], [place]   [id number]/[id number]   After Visit Summary: (MyChart) Due to this being a telephonic visit, the after visit summary with patients personalized plan was offered to patient via MyChart   Nurse Notes: No concerns

## 2023-06-13 NOTE — Patient Instructions (Addendum)
James Avila , Thank you for taking time to come for your Medicare Wellness Visit. I appreciate your ongoing commitment to your health goals. Please review the following plan we discussed and let me know if I can assist you in the future.   These are the goals we discussed:  Goals      Remain active and independent        This is a list of the screening recommended for you and due dates:  Health Maintenance  Topic Date Due   COVID-19 Vaccine (3 - Pfizer risk series) 03/19/2020   Flu Shot  06/29/2023   Medicare Annual Wellness Visit  06/12/2024   DTaP/Tdap/Td vaccine (2 - Td or Tdap) 08/04/2025   Pneumonia Vaccine  Completed   Hepatitis C Screening  Completed   HPV Vaccine  Aged Out   Colon Cancer Screening  Discontinued   Zoster (Shingles) Vaccine  Discontinued    Advanced directives: Information on Advanced Care Planning can be found at [facility] of [facility] Advance Health Care Directives Advance Health Care Directives ([email or web address] Please bring a copy of your health care power of attorney and living will to the office to be added to your chart at your convenience.  Conditions/risks identified: Aim for 30 minutes of exercise or brisk walking, 6-8 glasses of water, and 5 servings of fruits and vegetables each day.  Next appointment: Follow up in one year for your annual wellness visit.   Preventive Care 61 Years and Older, Male  Preventive care refers to lifestyle choices and visits with your health care provider that can promote health and wellness. What does preventive care include? A yearly physical exam. This is also called an annual well check. Dental exams once or twice a year. Routine eye exams. Ask your health care provider how often you should have your eyes checked. Personal lifestyle choices, including: Daily care of your teeth and gums. Regular physical activity. Eating a healthy diet. Avoiding tobacco and drug use. Limiting alcohol use. Practicing safe  sex. Taking low doses of aspirin every day. Taking vitamin and mineral supplements as recommended by your health care provider. What happens during an annual well check? The services and screenings done by your health care provider during your annual well check will depend on your age, overall health, lifestyle risk factors, and family history of disease. Counseling  Your health care provider may ask you questions about your: Alcohol use. Tobacco use. Drug use. Emotional well-being. Home and relationship well-being. Sexual activity. Eating habits. History of falls. Memory and ability to understand (cognition). Work and work Astronomer. Screening  You may have the following tests or measurements: Height, weight, and BMI. Blood pressure. Lipid and cholesterol levels. These may be checked every 5 years, or more frequently if you are over 27 years old. Skin check. Lung cancer screening. You may have this screening every year starting at age 78 if you have a 30-pack-year history of smoking and currently smoke or have quit within the past 15 years. Fecal occult blood test (FOBT) of the stool. You may have this test every year starting at age 70. Flexible sigmoidoscopy or colonoscopy. You may have a sigmoidoscopy every 5 years or a colonoscopy every 10 years starting at age 29. Prostate cancer screening. Recommendations will vary depending on your family history and other risks. Hepatitis C blood test. Hepatitis B blood test. Sexually transmitted disease (STD) testing. Diabetes screening. This is done by checking your blood sugar (glucose) after you have not  eaten for a while (fasting). You may have this done every 1-3 years. Abdominal aortic aneurysm (AAA) screening. You may need this if you are a current or former smoker. Osteoporosis. You may be screened starting at age 55 if you are at high risk. Talk with your health care provider about your test results, treatment options, and if  necessary, the need for more tests. Vaccines  Your health care provider may recommend certain vaccines, such as: Influenza vaccine. This is recommended every year. Tetanus, diphtheria, and acellular pertussis (Tdap, Td) vaccine. You may need a Td booster every 10 years. Zoster vaccine. You may need this after age 87. Pneumococcal 13-valent conjugate (PCV13) vaccine. One dose is recommended after age 27. Pneumococcal polysaccharide (PPSV23) vaccine. One dose is recommended after age 72. Talk to your health care provider about which screenings and vaccines you need and how often you need them. This information is not intended to replace advice given to you by your health care provider. Make sure you discuss any questions you have with your health care provider. Document Released: 12/11/2015 Document Revised: 08/03/2016 Document Reviewed: 09/15/2015 Elsevier Interactive Patient Education  2017 [employer].  Fall Prevention in the Home Falls can cause injuries. They can happen to people of all ages. There are many things you can do to make your home safe and to help prevent falls. What can I do on the outside of my home? Regularly fix the edges of walkways and driveways and fix any cracks. Remove anything that might make you trip as you walk through a door, such as a raised step or threshold. Trim any bushes or trees on the path to your home. Use bright outdoor lighting. Clear any walking paths of anything that might make someone trip, such as rocks or tools. Regularly check to see if handrails are loose or broken. Make sure that both sides of any steps have handrails. Any raised decks and porches should have guardrails on the edges. Have any leaves, snow, or ice cleared regularly. Use sand or salt on walking paths during winter. Clean up any spills in your garage right away. This includes oil or grease spills. What can I do in the bathroom? Use night lights. Install grab bars by the toilet  and in the tub and shower. Do not use towel bars as grab bars. Use non-skid mats or decals in the tub or shower. If you need to sit down in the shower, use a plastic, non-slip stool. Keep the floor dry. Clean up any water that spills on the floor as soon as it happens. Remove soap buildup in the tub or shower regularly. Attach bath mats securely with double-sided non-slip rug tape. Do not have throw rugs and other things on the floor that can make you trip. What can I do in the bedroom? Use night lights. Make sure that you have a light by your bed that is easy to reach. Do not use any sheets or blankets that are too big for your bed. They should not hang down onto the floor. Have a firm chair that has side arms. You can use this for support while you get dressed. Do not have throw rugs and other things on the floor that can make you trip. What can I do in the kitchen? Clean up any spills right away. Avoid walking on wet floors. Keep items that you use a lot in easy-to-reach places. If you need to reach something above you, use a strong step stool that  has a grab bar. Keep electrical cords out of the way. Do not use floor polish or wax that makes floors slippery. If you must use wax, use non-skid floor wax. Do not have throw rugs and other things on the floor that can make you trip. What can I do with my stairs? Do not leave any items on the stairs. Make sure that there are handrails on both sides of the stairs and use them. Fix handrails that are broken or loose. Make sure that handrails are as long as the stairways. Check any carpeting to make sure that it is firmly attached to the stairs. Fix any carpet that is loose or worn. Avoid having throw rugs at the top or bottom of the stairs. If you do have throw rugs, attach them to the floor with carpet tape. Make sure that you have a light switch at the top of the stairs and the bottom of the stairs. If you do not have them, ask someone to add  them for you. What else can I do to help prevent falls? Wear shoes that: Do not have high heels. Have rubber bottoms. Are comfortable and fit you well. Are closed at the toe. Do not wear sandals. If you use a stepladder: Make sure that it is fully opened. Do not climb a closed stepladder. Make sure that both sides of the stepladder are locked into place. Ask someone to hold it for you, if possible. Clearly mark and make sure that you can see: Any grab bars or handrails. First and last steps. Where the edge of each step is. Use tools that help you move around (mobility aids) if they are needed. These include: Canes. Walkers. Scooters. Crutches. Turn on the lights when you go into a dark area. Replace any light bulbs as soon as they burn out. Set up your furniture so you have a clear path. Avoid moving your furniture around. If any of your floors are uneven, fix them. If there are any pets around you, be aware of where they are. Review your medicines with your doctor. Some medicines can make you feel dizzy. This can increase your chance of falling. Ask your doctor what other things that you can do to help prevent falls. This information is not intended to replace advice given to you by your health care provider. Make sure you discuss any questions you have with your health care provider. Document Released: 09/10/2009 Document Revised: 04/21/2016 Document Reviewed: 12/19/2014 Elsevier Interactive Patient Education  2017 [employer].

## 2023-09-19 ENCOUNTER — Other Ambulatory Visit: Payer: Self-pay | Admitting: Internal Medicine

## 2023-09-19 DIAGNOSIS — N529 Male erectile dysfunction, unspecified: Secondary | ICD-10-CM

## 2023-09-21 NOTE — Telephone Encounter (Signed)
Requested Prescriptions  Pending Prescriptions Disp Refills   sildenafil (VIAGRA) 50 MG tablet [Pharmacy Med Name: SILDENAFIL 50 MG TABLET] 10 tablet 0    Sig: TAKE 1 TABLET BY MOUTH 30 MINUTES TO 1 HOUR BEFORE SEXUAL INTERCOURSE; LIMIT TO 1 TABLET PER 24 HOURS     Urology: Erectile Dysfunction Agents Failed - 09/19/2023  2:46 PM      Failed - Last BP in normal range    BP Readings from Last 1 Encounters:  05/04/23 (!) 157/82         Passed - AST in normal range and within 360 days    AST  Date Value Ref Range Status  05/04/2023 15 0 - 40 IU/L Final         Passed - ALT in normal range and within 360 days    ALT  Date Value Ref Range Status  05/04/2023 18 0 - 44 IU/L Final         Passed - Valid encounter within last 12 months    Recent Outpatient Visits           4 months ago Essential hypertension   [place] [facility] Marcine Matar, MD   1 year ago Hypotension due to medication   [facility] Marcine Matar, MD   1 year ago Essential hypertension   [place] [facility] Marcine Matar, MD   2 years ago Essential hypertension   [place] [facility] Marcine Matar, MD   2 years ago Essential hypertension   [place] [facility] Marcine Matar, MD       Future Appointments             In 1 month Laural Benes, Binnie Rail, MD [place] [facility]

## 2023-10-05 ENCOUNTER — Other Ambulatory Visit: Payer: Self-pay | Admitting: Internal Medicine

## 2023-10-05 DIAGNOSIS — N400 Enlarged prostate without lower urinary tract symptoms: Secondary | ICD-10-CM

## 2023-10-05 DIAGNOSIS — I10 Essential (primary) hypertension: Secondary | ICD-10-CM

## 2023-11-07 ENCOUNTER — Ambulatory Visit: Payer: Medicare HMO | Attending: Internal Medicine | Admitting: Internal Medicine

## 2023-11-07 ENCOUNTER — Encounter: Payer: Self-pay | Admitting: Internal Medicine

## 2023-11-07 VITALS — BP 150/77 | HR 68 | Temp 97.9°F | Ht 70.0 in | Wt 182.0 lb

## 2023-11-07 DIAGNOSIS — F5104 Psychophysiologic insomnia: Secondary | ICD-10-CM | POA: Diagnosis not present

## 2023-11-07 DIAGNOSIS — Z23 Encounter for immunization: Secondary | ICD-10-CM

## 2023-11-07 DIAGNOSIS — E559 Vitamin D deficiency, unspecified: Secondary | ICD-10-CM | POA: Diagnosis not present

## 2023-11-07 DIAGNOSIS — M1389 Other specified arthritis, multiple sites: Secondary | ICD-10-CM

## 2023-11-07 DIAGNOSIS — M65341 Trigger finger, right ring finger: Secondary | ICD-10-CM | POA: Diagnosis not present

## 2023-11-07 DIAGNOSIS — M545 Low back pain, unspecified: Secondary | ICD-10-CM

## 2023-11-07 DIAGNOSIS — M2550 Pain in unspecified joint: Secondary | ICD-10-CM | POA: Diagnosis not present

## 2023-11-07 DIAGNOSIS — M65311 Trigger thumb, right thumb: Secondary | ICD-10-CM

## 2023-11-07 DIAGNOSIS — Z125 Encounter for screening for malignant neoplasm of prostate: Secondary | ICD-10-CM

## 2023-11-07 DIAGNOSIS — N400 Enlarged prostate without lower urinary tract symptoms: Secondary | ICD-10-CM | POA: Diagnosis not present

## 2023-11-07 DIAGNOSIS — I10 Essential (primary) hypertension: Secondary | ICD-10-CM

## 2023-11-07 DIAGNOSIS — G8929 Other chronic pain: Secondary | ICD-10-CM

## 2023-11-07 DIAGNOSIS — M65321 Trigger finger, right index finger: Secondary | ICD-10-CM

## 2023-11-07 DIAGNOSIS — M65331 Trigger finger, right middle finger: Secondary | ICD-10-CM

## 2023-11-07 MED ORDER — ZOLPIDEM TARTRATE 5 MG PO TABS: 5.0000 mg | ORAL_TABLET | Freq: Every evening | ORAL | 5 refills | Status: DC | PRN

## 2023-11-07 MED ORDER — ACETAMINOPHEN 500 MG PO TABS: 500.0000 mg | ORAL_TABLET | Freq: Two times a day (BID) | ORAL | 1 refills | Status: AC | PRN

## 2023-11-07 MED ORDER — METHOCARBAMOL 500 MG PO TABS: 500.0000 mg | ORAL_TABLET | Freq: Every day | ORAL | 1 refills | Status: DC | PRN

## 2023-11-07 NOTE — Progress Notes (Unsigned)
Patient ID: James Avila, male    DOB: 1951-04-22  MRN: [id number]  CC: Hypertension (HTN f/u. Med refill /Trouble sleeping getting about 4 - 5 hrs of sleep per night, lack of energy, joint pain X4-5 mo/Flu vax administered on 11/07/2023 - C.A)   Subjective: James Avila is a 72 y.o. male who presents for chronic ds management. 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   Discussed the use of AI scribe software for clinical note transcription with the patient, who gave verbal consent to proceed.  History of Present Illness   The patient, with a history of hypertension and benign prostatic hyperplasia (BPH), presents with multiple complaints.   HTN:The patient's primary concern is elevated blood pressure, despite being on Lisinopril hydrochlorothiazide 20/12.5 mg twice a day and Carvedilol 6.25 mg twice a day.  Confirms taking medications consistently and took morning doses already.  The patient admits to not regularly monitoring his blood pressure at home and not limiting salt intake.  Insomnia/lower back pain: The patient also reports worsening insomnia, for which he is taking Ambien more frequently than before, about five nights a week compared to twice a week previously. The patient attributes the insomnia to persistent back pain and nocturia. The back pain is localized to the lower back, particularly on the right side, and is associated with muscle spasms. No radiation down leg or numbness. The patient has been using Methocarbamol and Diclofenac gel for relief.  X-ray of the lumbar spine done 1 year ago revealed mild dextroscoliosis of the lower lumbar spine and disc degenerative changes from L2-S1.  I had recommended referral to physical therapy on his last visit but patient declined.  He is still not interested in doing that. The patient also  reports nocturia, needing to urinate twice a night, which is significantly less before being on Flomax for BPH.    Polyarthralgia:  Recently, the patient has been experiencing joint pain in the hands,feet and ankles, which he suspects might be due to arthritis.  Symptoms present for the past 4 to 5 months.  Pain and stiffness goes and comes.  Can last all day when it comes on.  No swelling in the hands or wrists.  The patient also reports having a trigger finger, with the middle finger on the left hand and the middle and ring fingers on the right hand being affected.  -He has history of severe vitamin D deficiency.  He has been taking vitamin D supplement over-the-counter daily.      Patient Active Problem List   Diagnosis Date Noted   Colon cancer screening declined 07/17/2020   Lung cancer screening declined by patient 07/17/2020   Headache syndrome 03/19/2020   Hepatitis C virus infection cured after antiviral drug therapy 02/05/2019   Panic disorder 02/05/2019   Nodule of shaft of penis 06/02/2017   Seborrheic keratoses 05/02/2017   Insomnia 04/06/2017   Plantar fasciitis, left 03/06/2017   Deltoid tendonitis, unspecified laterality 01/20/2017   Impotence 04/11/2016   Plantar wart of left foot 04/11/2016   Vitamin D deficiency 04/08/2016   Hepatic cirrhosis (HCC) 07/31/2014   BPH (benign prostatic hyperplasia) 10/10/2013   Anxiety state 10/10/2013   Chronic back pain 10/10/2013   TOBACCO USE 09/07/2007   Hepatitis C virus infection without hepatic coma 05/10/2007   Essential hypertension 05/10/2007  Current Outpatient Medications on File Prior to Visit  Medication Sig Dispense Refill   aspirin 81 MG tablet Take 1 tablet (81 mg total) by mouth daily. 90 tablet 3   carvedilol (COREG) 6.25 MG tablet TAKE 1 TABLET TWICE DAILY WITH MEALS 180 tablet 0   Cholecalciferol (VITAMIN D3 PO) Take 5,000 mg by mouth daily.     lisinopril-hydrochlorothiazide (ZESTORETIC) 20-12.5 MG tablet  TAKE 1 TABLET IN THE MORNING AND AT BEDTIME 180 tablet 0   sildenafil (VIAGRA) 50 MG tablet TAKE 1 TABLET BY MOUTH 30 MINUTES TO 1 HOUR BEFORE SEXUAL INTERCOURSE; LIMIT TO 1 TABLET PER 24 HOURS 10 tablet 0   tamsulosin (FLOMAX) 0.4 MG CAPS capsule TAKE 2 CAPSULES EVERY DAY AFTER SUPPER 180 capsule 0   No current facility-administered medications on file prior to visit.    Allergies  Allergen Reactions   Hydralazine     Causes numbness Increased blood pressure    Social History   Socioeconomic History   Marital status: Divorced    Spouse name: Not on file   Number of children: Not on file   Years of education: Not on file   Highest education level: 12th grade  Occupational History   Not on file  Tobacco Use   Smoking status: Every Day    Current packs/day: 0.50    Types: Cigarettes   Smokeless tobacco: Never   Tobacco comments:    Smoking 5-10 cigs/day  Vaping Use   Vaping status: Never Used  Substance and Sexual Activity   Alcohol use: Yes    Alcohol/week: 4.0 standard drinks of alcohol    Types: 4 Cans of beer per week    Comment: weekly   Drug use: No   Sexual activity: Not on file  Other Topics Concern   Not on file  Social History Narrative   Not on file   Social Determinants of Health   Financial Resource Strain: Medium Risk (11/07/2023)   Overall Financial Resource Strain (CARDIA)    Difficulty of Paying Living Expenses: Somewhat hard  Food Insecurity: Food Insecurity Present (11/07/2023)   Hunger Vital Sign    Worried About Running Out of Food in the Last Year: Sometimes true    Ran Out of Food in the Last Year: Never true  Transportation Needs: No Transportation Needs (11/07/2023)   PRAPARE - Administrator, Civil Service (Medical): No    Lack of Transportation (Non-Medical): No  Physical Activity: Insufficiently Active (11/07/2023)   Exercise Vital Sign    Days of Exercise per Week: 1 day    Minutes of Exercise per Session: 10 min   Stress: Stress Concern Present (11/07/2023)   [employer] of Occupational Health - Occupational Stress Questionnaire    Feeling of Stress : To some extent  Social Connections: Socially Isolated (11/07/2023)   Social Connection and Isolation Panel [NHANES]    Frequency of Communication with Friends and Family: Three times a week    Frequency of Social Gatherings with Friends and Family: Twice a week    Attends Religious Services: Never    Database administrator or Organizations: No    Attends Banker Meetings: Never    Marital Status: Divorced  Catering manager Violence: Not At Risk (11/07/2023)   Humiliation, Afraid, Rape, and Kick questionnaire    Fear of Current or Ex-Partner: No    Emotionally Abused: No    Physically Abused: No    Sexually Abused: No    Family  History  Problem Relation Age of Onset   Hypertension Mother    Hydrocephalus Mother    Stroke Mother    Diabetes Mother    Cancer Father 13       skin     Past Surgical History:  Procedure Laterality Date   TONSILLECTOMY      ROS: Review of Systems Negative except as stated above  PHYSICAL EXAM: BP (!) 150/77   Pulse 68   Temp 97.9 F (36.6 C) (Oral)   Ht 5\' 10"  (1.778 m)   Wt 182 lb (82.6 kg)   SpO2 95%   BMI 26.11 kg/m   Physical Exam   General appearance - alert, well appearing, and in no distress Mental status - normal mood, behavior, speech, dress, motor activity, and thought processes Neck - supple, no significant adenopathy Chest - clear to auscultation, no wheezes, rales or rhonchi, symmetric air entry Heart - normal rate, regular rhythm, normal S1, S2, no murmurs, rubs, clicks or gallops Musculoskeletal -no signs of active tenosynovitis in the wrists, hands, ankles and feet.  Mild enlargement of the PIP joints of both hands.  Good range of motion of the wrist, fingers, ankles and toes.  Patient ambulates unassisted. Extremities - peripheral pulses normal, no pedal  edema, no clubbing or cyanosis     Latest Ref Rng & Units 05/04/2023    2:58 PM 09/17/2021    3:22 PM 05/10/2018   11:40 AM  CMP  Glucose 70 - 99 mg/dL 89  97  161   BUN 8 - 27 mg/dL 19  25  17    Creatinine 0.76 - 1.27 mg/dL 0.96  0.45  4.09   Sodium 134 - 144 mmol/L 141  140  140   Potassium 3.5 - 5.2 mmol/L 4.0  4.6  3.6   Chloride 96 - 106 mmol/L 101  101  99   CO2 20 - 29 mmol/L 27  25  26    Calcium 8.6 - 10.2 mg/dL 9.2  9.3  9.2   Total Protein 6.0 - 8.5 g/dL 7.1   7.0   Total Bilirubin 0.0 - 1.2 mg/dL 0.8   0.6   Alkaline Phos 44 - 121 IU/L 59   60   AST 0 - 40 IU/L 15   13   ALT 0 - 44 IU/L 18   10    Lipid Panel     Component Value Date/Time   CHOL 172 05/04/2023 1458   TRIG 273 (H) 05/04/2023 1458   HDL 40 05/04/2023 1458   CHOLHDL 4.3 05/04/2023 1458   CHOLHDL 5.0 10/08/2014 0912   VLDL 28 10/08/2014 0912   LDLCALC 87 05/04/2023 1458    CBC    Component Value Date/Time   WBC 8.7 05/04/2023 1458   WBC 7.6 01/20/2017 1216   RBC 4.83 05/04/2023 1458   RBC 4.73 01/20/2017 1216   HGB 15.3 05/04/2023 1458   HCT 43.5 05/04/2023 1458   PLT 219 05/04/2023 1458   MCV 90 05/04/2023 1458   MCH 31.7 05/04/2023 1458   MCH 30.7 01/20/2017 1216   MCHC 35.2 05/04/2023 1458   MCHC 34.2 01/20/2017 1216   RDW 12.6 05/04/2023 1458   LYMPHSABS 3.1 05/10/2018 1140   MONOABS 0.6 04/30/2014 1500   EOSABS 0.4 05/10/2018 1140   BASOSABS 0.0 05/10/2018 1140    ASSESSMENT AND PLAN: 1. Essential hypertension Not at goal. Discussed adding another blood pressure medication to what he is currently taking.  He inquired about increasing  the carvedilol but pulse rate is already in the 60s so I advised against doing that. -He declines having another blood pressure medication added.  He will continue carvedilol 6.25 mg twice a day and lisinopril/HCTZ 20/12.5 mg twice a day. -Recommended that he check blood pressure at least once a week and record the readings and follow-up with our  clinical pharmacist in 1 month.  Patient declined returning in 1 month stating that he will check his blood pressure and send them to me via MyChart.  2. Chronic insomnia Chronic pain playing a role.  Refill given on Robaxin.  Recommend taking Tylenol 500 mg twice a day as needed. Continue Ambien 5 mg nightly as needed.  [place] controlled substance reporting system reviewed. - zolpidem (AMBIEN) 5 MG tablet; Take 1 tablet (5 mg total) by mouth at bedtime as needed.  Dispense: 30 tablet; Refill: 5  3. Chronic right-sided low back pain without sciatica With known degenerative disc and mild scoliosis of the lumbar spine.  Patient declines referral to physical therapy.  Refill given on Robaxin.  Advised to take Tylenol as needed.  We will check PSA as well. - methocarbamol (ROBAXIN) 500 MG tablet; Take 1 tablet (500 mg total) by mouth daily as needed for muscle spasms. Take 1 tablet by mouth every 8 hours as needed for muscle spasms.  Dispense: 30 tablet; Refill: 1 - acetaminophen (TYLENOL) 500 MG tablet; Take 1 tablet (500 mg total) by mouth 2 (two) times daily as needed.  Dispense: 60 tablet; Refill: 1  4. Benign prostatic hyperplasia, unspecified whether lower urinary tract symptoms present Continue Flomax - PSA  5. Trigger finger of all digits of both hands We can refer to orthopedics but patient declines at this time.  Robaxin may help with this.  6. Polyarthralgia Describes aching and stiffness in the small joints of the hands and feet.  Exam today negative for signs of active tenosynovitis.  We will check some inflammatory markers and rheumatoid arthritis markers. - Sedimentation Rate - C-reactive protein - Rheumatoid factor - CYCLIC CITRUL PEPTIDE ANTIBODY, IGG/IGA  7. Vitamin D deficiency - VITAMIN D 25 Hydroxy (Vit-D Deficiency, Fractures)  8. Encounter for immunization - Flu Vaccine Trivalent High Dose (Fluad)  9. Prostate cancer screening - PSA   Patient was given  the opportunity to ask questions.  Patient verbalized understanding of the plan and was able to repeat key elements of the plan.   This documentation was completed using Paediatric nurse.  Any transcriptional errors are unintentional.  Orders Placed This Encounter  Procedures   Flu Vaccine Trivalent High Dose (Fluad)   VITAMIN D 25 Hydroxy (Vit-D Deficiency, Fractures)   PSA   Sedimentation Rate   C-reactive protein   Rheumatoid factor   CYCLIC CITRUL PEPTIDE ANTIBODY, IGG/IGA     Requested Prescriptions   Signed Prescriptions Disp Refills   methocarbamol (ROBAXIN) 500 MG tablet 30 tablet 1    Sig: Take 1 tablet (500 mg total) by mouth daily as needed for muscle spasms. Take 1 tablet by mouth every 8 hours as needed for muscle spasms.   zolpidem (AMBIEN) 5 MG tablet 30 tablet 5    Sig: Take 1 tablet (5 mg total) by mouth at bedtime as needed.   acetaminophen (TYLENOL) 500 MG tablet 60 tablet 1    Sig: Take 1 tablet (500 mg total) by mouth 2 (two) times daily as needed.    Return in about 4 months (around 03/07/2024).  Jonah Blue,  MD, [facility]

## 2023-11-08 ENCOUNTER — Encounter: Payer: Self-pay | Admitting: Internal Medicine

## 2023-11-09 LAB — PSA: Prostate Specific Ag, Serum: 1.3 ng/mL (ref 0.0–4.0)

## 2023-11-09 LAB — VITAMIN D 25 HYDROXY (VIT D DEFICIENCY, FRACTURES): Vit D, 25-Hydroxy: 33.1 ng/mL (ref 30.0–100.0)

## 2023-11-09 LAB — SEDIMENTATION RATE: Sed Rate: 5 mm/h (ref 0–30)

## 2023-11-09 LAB — C-REACTIVE PROTEIN: CRP: 3 mg/L (ref 0–10)

## 2023-11-09 LAB — CYCLIC CITRUL PEPTIDE ANTIBODY, IGG/IGA: Cyclic Citrullin Peptide Ab: 8 U (ref 0–19)

## 2023-11-09 LAB — RHEUMATOID FACTOR: Rheumatoid fact SerPl-aCnc: <10 [IU]/mL (ref <14.0)

## 2023-11-14 ENCOUNTER — Other Ambulatory Visit: Payer: Self-pay | Admitting: Pharmacist

## 2023-11-14 ENCOUNTER — Telehealth: Payer: Self-pay | Admitting: Internal Medicine

## 2023-11-14 ENCOUNTER — Ambulatory Visit: Payer: Self-pay

## 2023-11-14 DIAGNOSIS — M545 Low back pain, unspecified: Secondary | ICD-10-CM

## 2023-11-14 MED ORDER — METHOCARBAMOL 500 MG PO TABS: ORAL_TABLET | ORAL | 1 refills | Status: DC

## 2023-11-14 NOTE — Telephone Encounter (Addendum)
Message from Bath F sent at 11/14/2023  3:49 PM EST  Summary: Medication clarification   Pt is calling in because he says [facility] needs clarification on the medication methocarbamol (ROBAXIN) 500 MG tablet [[id number]] before they can fill it.          Chief Complaint: Medication clarification  Disposition: [] ED /[] Urgent Care (no appt availability in office) / [] Appointment(In office/virtual)/ []  [facility]/ [] Home Care/ [] Refused Recommended Disposition /[] [facility]/ [x]  Follow-up with PCP Additional Notes: routing to office Called pt and LM on VM. Advised will forward to PCP to clarify directions. Ordered as:  Take 1 tablet (500 mg total) by mouth daily as needed for muscle spasms. Take 1 tablet by mouth every 8 hours as needed for muscle spasms. Dispense: 30 tablet, Refills: 1 ordered  Reason for Disposition . [1] Caller has URGENT medicine question about med that PCP or specialist prescribed AND [2] triager unable to answer question  Answer Assessment - Initial Assessment Questions 1. NAME of MEDICINE: "What medicine(s) are you calling about?"     Methocarbamol 2. QUESTION: "What is your question?" (e.g., double dose of medicine, side effect)     Is pt to take daily or every 8 hours 3. PRESCRIBER: "Who prescribed the medicine?" Reason: if prescribed by specialist, call should be referred to that group.     PCP  Protocols used: Medication Question Call-A-AH

## 2023-11-14 NOTE — Telephone Encounter (Signed)
error 

## 2023-12-17 ENCOUNTER — Other Ambulatory Visit: Payer: Self-pay | Admitting: Internal Medicine

## 2023-12-17 DIAGNOSIS — I10 Essential (primary) hypertension: Secondary | ICD-10-CM

## 2023-12-17 DIAGNOSIS — N400 Enlarged prostate without lower urinary tract symptoms: Secondary | ICD-10-CM

## 2024-03-07 ENCOUNTER — Ambulatory Visit: Payer: Medicare HMO | Admitting: Internal Medicine

## 2024-05-09 ENCOUNTER — Other Ambulatory Visit: Payer: Self-pay | Admitting: Internal Medicine

## 2024-05-09 DIAGNOSIS — F5104 Psychophysiologic insomnia: Secondary | ICD-10-CM

## 2024-05-10 ENCOUNTER — Telehealth: Payer: Self-pay | Admitting: Internal Medicine

## 2024-05-10 NOTE — Telephone Encounter (Signed)
 Called the patient to schedule a follow up appointment, patient did not answer; left a detailed VM for patient to call back.

## 2024-05-10 NOTE — Telephone Encounter (Addendum)
-----   Message from Concetta Dee sent at 05/09/2024  6:31 PM EDT ----- Regarding: Appt Overdue for follow-up visit for chronic disease management.  Please call him and schedule.

## 2024-06-18 ENCOUNTER — Ambulatory Visit: Payer: Medicare HMO

## 2024-08-05 ENCOUNTER — Telehealth: Payer: Self-pay | Admitting: Internal Medicine

## 2024-08-05 NOTE — Telephone Encounter (Signed)
 Confirmed appt for 9/9

## 2024-08-06 ENCOUNTER — Ambulatory Visit: Attending: Internal Medicine | Admitting: Internal Medicine

## 2024-08-06 ENCOUNTER — Encounter: Payer: Self-pay | Admitting: Internal Medicine

## 2024-08-06 VITALS — BP 145/82 | HR 70 | Ht 70.0 in | Wt 183.0 lb

## 2024-08-06 DIAGNOSIS — F17200 Nicotine dependence, unspecified, uncomplicated: Secondary | ICD-10-CM

## 2024-08-06 DIAGNOSIS — M2550 Pain in unspecified joint: Secondary | ICD-10-CM

## 2024-08-06 DIAGNOSIS — M65322 Trigger finger, left index finger: Secondary | ICD-10-CM

## 2024-08-06 DIAGNOSIS — M65331 Trigger finger, right middle finger: Secondary | ICD-10-CM

## 2024-08-06 DIAGNOSIS — I10 Essential (primary) hypertension: Secondary | ICD-10-CM | POA: Diagnosis not present

## 2024-08-06 DIAGNOSIS — F17210 Nicotine dependence, cigarettes, uncomplicated: Secondary | ICD-10-CM | POA: Diagnosis not present

## 2024-08-06 DIAGNOSIS — Z2821 Immunization not carried out because of patient refusal: Secondary | ICD-10-CM

## 2024-08-06 DIAGNOSIS — K7460 Unspecified cirrhosis of liver: Secondary | ICD-10-CM

## 2024-08-06 DIAGNOSIS — M65351 Trigger finger, right little finger: Secondary | ICD-10-CM

## 2024-08-06 DIAGNOSIS — M65312 Trigger thumb, left thumb: Secondary | ICD-10-CM

## 2024-08-06 DIAGNOSIS — F5104 Psychophysiologic insomnia: Secondary | ICD-10-CM

## 2024-08-06 DIAGNOSIS — M25532 Pain in left wrist: Secondary | ICD-10-CM | POA: Diagnosis not present

## 2024-08-06 DIAGNOSIS — N400 Enlarged prostate without lower urinary tract symptoms: Secondary | ICD-10-CM

## 2024-08-06 DIAGNOSIS — M65342 Trigger finger, left ring finger: Secondary | ICD-10-CM

## 2024-08-06 DIAGNOSIS — M545 Low back pain, unspecified: Secondary | ICD-10-CM

## 2024-08-06 DIAGNOSIS — G8929 Other chronic pain: Secondary | ICD-10-CM | POA: Diagnosis not present

## 2024-08-06 DIAGNOSIS — F320 Major depressive disorder, single episode, mild: Secondary | ICD-10-CM

## 2024-08-06 MED ORDER — LISINOPRIL-HYDROCHLOROTHIAZIDE 20-12.5 MG PO TABS: 1.0000 | ORAL_TABLET | Freq: Two times a day (BID) | ORAL | 3 refills | Status: AC

## 2024-08-06 MED ORDER — METHOCARBAMOL 500 MG PO TABS: ORAL_TABLET | ORAL | 1 refills | Status: DC

## 2024-08-06 MED ORDER — CELECOXIB 200 MG PO CAPS: 200.0000 mg | ORAL_CAPSULE | Freq: Every day | ORAL | 3 refills | Status: DC

## 2024-08-06 MED ORDER — CARVEDILOL 6.25 MG PO TABS: 6.2500 mg | ORAL_TABLET | Freq: Two times a day (BID) | ORAL | 3 refills | Status: AC

## 2024-08-06 MED ORDER — AMLODIPINE BESYLATE 5 MG PO TABS: 5.0000 mg | ORAL_TABLET | Freq: Every day | ORAL | 1 refills | Status: DC

## 2024-08-06 NOTE — Progress Notes (Addendum)
 Patient ID: James Avila, male    DOB: November 21, 1951  MRN: [id number]  CC: Hypertension (HTN f/u. Layvonne dosage increase for Ambien  /Scrapped rusty bar on L arm - 2 weeks ago Janiece toTdap vax. Discuss covid vax )   Subjective: James Avila is a 73 y.o. male who presents for chronic ds management. 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   Discussed the use of AI scribe software for clinical note transcription with the patient, who gave verbal consent to proceed.  History of Present Illness James Avila is a 73 year old male with hypertension, insomnia, and chronic back pain who presents for follow-up of his chronic medical conditions.  He has a recent scrape on his left arm from yard work about two and a half weeks ago. Wants to know if he needs tetanus vaccine. His last tetanus shot was in 2016, and he is due for a booster next year.  HTN: His blood pressure was elevated at 170/95 during the visit. He monitors his blood pressure at home using a wrist device, with readings typically around 147/87 before medication and lower after taking his medication. He takes lisinopril /hydrochlorothiazide  20/12.5 mg twice daily and carvedilol  6.25 mg twice daily. He is not strictly limiting salt intake but has made some improvements. No chest pain or shortness of breath, but he occasionally experiences dizziness when standing.  Insomnia: He experiences chronic insomnia, taking Ambien  5 mg more frequently and now requires a full pill to achieve four to five hours of sleep. Use to be able to get away with just 1/2 tab. No drug hangover is reported, but some sleep issues are attributed to chronic back pain.  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 He experiences trigger finger  symptoms, with pain and locking in his fingers, which has worsened over the past six months.  He reported the same on last visit but declined referral to orthopedics.  We had checked sed rate, rheumatoid factor and CCP antibody all of which were normal.  He takes methocarbamol  for muscle relaxation and uses ibuprofen and Aleve  for pain management. Also reports chronic pain of the RT forearm  that started several years ago after injury to it.  He thinks he may have fx it at the time but was not seen for it.  He states that he does not want anything done for it and does not want any x-rays at this time but just wanted me to make note of it.  He has a history of prostate enlargement and takes tamsulosin , reporting no significant urinary issues recently. He occasionally experiences throbbing pain in the prostate but not in the past six months.  He has a history of cirrhosis secondary to hepatitis C and has opted not to continue liver ultrasounds.  Last LFTs were normal.  He reports a history of depression, which he describes as mild and variable in intensity, and does not feel the need for medication or counseling at this time.  Tob dep: He smokes about three-quarters of a pack of cigarettes per day, purchasing two cartons a month. He acknowledges the habit is harmful but is not ready to quit.  He is considering applying for Medicaid to help with medical expenses, as he is currently on  a tight budget and has not yet applied.  HM: He declines being scheduled for a Medicare wellness visit.    Patient Active Problem List   Diagnosis Date Noted   Colon cancer screening declined 07/17/2020   Lung cancer screening declined by patient 07/17/2020   Headache syndrome 03/19/2020   Hepatitis C virus infection cured after antiviral drug therapy 02/05/2019   Panic disorder 02/05/2019   Nodule of shaft of penis 06/02/2017   Seborrheic keratoses 05/02/2017   Insomnia 04/06/2017   Plantar fasciitis, left  03/06/2017   Deltoid tendonitis, unspecified laterality 01/20/2017   Impotence 04/11/2016   Plantar wart of left foot 04/11/2016   Vitamin D  deficiency 04/08/2016   Hepatic cirrhosis (HCC) 07/31/2014   BPH (benign prostatic hyperplasia) 10/10/2013   Anxiety state 10/10/2013   Chronic back pain 10/10/2013   TOBACCO USE 09/07/2007   Hepatitis C virus infection without hepatic coma 05/10/2007   Essential hypertension 05/10/2007     Current Outpatient Medications on File Prior to Visit  Medication Sig Dispense Refill   acetaminophen  (TYLENOL ) 500 MG tablet Take 1 tablet (500 mg total) by mouth 2 (two) times daily as needed. 60 tablet 1   aspirin  81 MG tablet Take 1 tablet (81 mg total) by mouth daily. 90 tablet 3   Cholecalciferol (VITAMIN D3 PO) Take 5,000 mg by mouth daily.     sildenafil  (VIAGRA ) 50 MG tablet TAKE 1 TABLET BY MOUTH 30 MINUTES TO 1 HOUR BEFORE SEXUAL INTERCOURSE; LIMIT TO 1 TABLET PER 24 HOURS 10 tablet 0   tamsulosin  (FLOMAX ) 0.4 MG CAPS capsule TAKE 2 CAPSULES EVERY DAY AFTER SUPPER 180 capsule 3   zolpidem  (AMBIEN ) 5 MG tablet TAKE ONE TABLET BY MOUTH AT BEDTIME AS NEEDED 30 tablet 1   No current facility-administered medications on file prior to visit.    Allergies  Allergen Reactions   Hydralazine      Causes numbness Increased blood pressure    Social History   Socioeconomic History   Marital status: Divorced    Spouse name: Not on file   Number of children: Not on file   Years of education: Not on file   Highest education level: Associate degree: occupational, scientist, product/process development, or vocational program  Occupational History   Not on file  Tobacco Use   Smoking status: Every Day    Current packs/day: 0.50    Types: Cigarettes   Smokeless tobacco: Never   Tobacco comments:    Smoking 5-10 cigs/day  Vaping Use   Vaping status: Never Used  Substance and Sexual Activity   Alcohol use: Yes    Alcohol/week: 4.0 standard drinks of alcohol    Types: 4 Cans of  beer per week    Comment: weekly   Drug use: No   Sexual activity: Not on file  Other Topics Concern   Not on file  Social History Narrative   Not on file   Social Drivers of Health   Financial Resource Strain: High Risk (06/16/2024)   Overall Financial Resource Strain (CARDIA)    Difficulty of Paying Living Expenses: Hard  Food Insecurity: Food Insecurity Present (06/16/2024)   Hunger Vital Sign    Worried About Running Out of Food in the Last Year: Sometimes true    Ran Out of Food in the Last Year: Sometimes true  Transportation Needs: No Transportation Needs (06/16/2024)   PRAPARE - Administrator, Civil Service (Medical): No    Lack of Transportation (Non-Medical): No  Physical Activity:  Insufficiently Active (06/16/2024)   Exercise Vital Sign    Days of Exercise per Week: 1 day    Minutes of Exercise per Session: 90 min  Stress: Stress Concern Present (06/16/2024)   [employer] of Occupational Health - Occupational Stress Questionnaire    Feeling of Stress: Rather much  Social Connections: Socially Isolated (06/16/2024)   Social Connection and Isolation Panel    Frequency of Communication with Friends and Family: Once a week    Frequency of Social Gatherings with Friends and Family: Twice a week    Attends Religious Services: Never    Database Administrator or Organizations: No    Attends Engineer, Structural: Not on file    Marital Status: Divorced  Intimate Partner Violence: Not At Risk (11/07/2023)   Humiliation, Afraid, Rape, and Kick questionnaire    Fear of Current or Ex-Partner: No    Emotionally Abused: No    Physically Abused: No    Sexually Abused: No    Family History  Problem Relation Age of Onset   Hypertension Mother    Hydrocephalus Mother    Stroke Mother    Diabetes Mother    Cancer Father 57       skin     Past Surgical History:  Procedure Laterality Date   TONSILLECTOMY      ROS: Review of Systems Negative  except as stated above  PHYSICAL EXAM: BP (!) 145/82   Pulse 70   Ht 5' 10 (1.778 m)   Wt 183 lb (83 kg)   SpO2 96%   BMI 26.26 kg/m   Physical Exam   General appearance - alert, well appearing, and in no distress Mental status - normal mood, behavior, speech, dress, motor activity, and thought processes Neck - supple, no significant adenopathy Chest - clear to auscultation, no wheezes, rales or rhonchi, symmetric air entry Heart - normal rate, regular rhythm, normal S1, S2, no murmurs, rubs, clicks or gallops Abdomen: Soft and nontender.  No organomegaly Musculoskeletal -patient is wearing a support band around the waist. Hands: At the PIP joints right greater than left. Extremities -no lower extremity edema.     Latest Ref Rng & Units 05/04/2023    2:58 PM 09/17/2021    3:22 PM 05/10/2018   11:40 AM  CMP  Glucose 70 - 99 mg/dL 89  97  886   BUN 8 - 27 mg/dL 19  25  17    Creatinine 0.76 - 1.27 mg/dL 8.94  8.87  8.99   Sodium 134 - 144 mmol/L 141  140  140   Potassium 3.5 - 5.2 mmol/L 4.0  4.6  3.6   Chloride 96 - 106 mmol/L 101  101  99   CO2 20 - 29 mmol/L 27  25  26    Calcium 8.6 - 10.2 mg/dL 9.2  9.3  9.2   Total Protein 6.0 - 8.5 g/dL 7.1   7.0   Total Bilirubin 0.0 - 1.2 mg/dL 0.8   0.6   Alkaline Phos 44 - 121 IU/L 59   60   AST 0 - 40 IU/L 15   13   ALT 0 - 44 IU/L 18   10    Lipid Panel     Component Value Date/Time   CHOL 172 05/04/2023 1458   TRIG 273 (H) 05/04/2023 1458   HDL 40 05/04/2023 1458   CHOLHDL 4.3 05/04/2023 1458   CHOLHDL 5.0 10/08/2014 0912   VLDL 28 10/08/2014 0912  LDLCALC 87 05/04/2023 1458    CBC    Component Value Date/Time   WBC 8.7 05/04/2023 1458   WBC 7.6 01/20/2017 1216   RBC 4.83 05/04/2023 1458   RBC 4.73 01/20/2017 1216   HGB 15.3 05/04/2023 1458   HCT 43.5 05/04/2023 1458   PLT 219 05/04/2023 1458   MCV 90 05/04/2023 1458   MCH 31.7 05/04/2023 1458   MCH 30.7 01/20/2017 1216   MCHC 35.2 05/04/2023 1458   MCHC  34.2 01/20/2017 1216   RDW 12.6 05/04/2023 1458   LYMPHSABS 3.1 05/10/2018 1140   MONOABS 0.6 04/30/2014 1500   EOSABS 0.4 05/10/2018 1140   BASOSABS 0.0 05/10/2018 1140    ASSESSMENT AND PLAN: 1. Essential hypertension (Primary) Not at goal. Continue carvedilol  and lisinopril /HCTZ.  Recommend adding Norvasc  5 mg daily.  Patient agreeable to receiving the prescription but states he will continue to check his blood pressure at home and if it remains high then he will consider taking the amlodipine .  Encourage DASH diet - carvedilol  (COREG ) 6.25 MG tablet; Take 1 tablet (6.25 mg total) by mouth 2 (two) times daily with a meal.  Dispense: 180 tablet; Refill: 3 - lisinopril -hydrochlorothiazide  (ZESTORETIC ) 20-12.5 MG tablet; Take 1 tablet by mouth 2 (two) times daily.  Dispense: 180 tablet; Refill: 3 - amLODipine  (NORVASC ) 5 MG tablet; Take 1 tablet (5 mg total) by mouth daily.  Dispense: 90 tablet; Refill: 1 - CBC - Comprehensive metabolic panel with GFR - Lipid panel  2. Chronic insomnia Advised patient that over time persistent long-term use of sleep aid caused tolerance to the medication.  At this time he should hold off on increasing dose of Ambien  5 mg daily given increase age  101. Tobacco dependence Strongly advised to quit.  Patient not ready to give a trial of quitting.  4. Benign prostatic hyperplasia without lower urinary tract symptoms Flomax   5. Chronic right-sided low back pain without sciatica Patient continues to report chronic back pain.  He declines for physical therapy or to orthopedics.  PSA checked on last visit was within normal range - methocarbamol  (ROBAXIN ) 500 MG tablet; Take 1 tablet by mouth every 8 hours as needed for muscle spasms.  Dispense: 30 tablet; Refill: 1  6. Cirrhosis of liver without ascites, unspecified hepatic cirrhosis type [facility]) Patient declines ultrasound for screening of HCC  7. Major depressive disorder, single episode, mild (HCC) Patient  reports this is mild and has had it all of his life.  Does not he needs medication or counseling at this time  8. Polyarthralgia I recommend that he discontinue ibuprofen.  Will try with Celebrex  instead - celecoxib  (CELEBREX ) 200 MG capsule; Take 1 capsule (200 mg total) by mouth daily.  Dispense: 30 capsule; Refill: 3  9. Forearm joint pain, left This is chronic patient declines x-ray - celecoxib  (CELEBREX ) 200 MG capsule; Take 1 capsule (200 mg total) by mouth daily.  Dispense: 30 capsule; Refill: 3  10. Trigger finger of all digits of both hands Continue Robaxin  as needed.  Declines referral to orthopedics  11. Influenza vaccination declined   Patient was given the opportunity to ask questions.  Patient verbalized understanding of the plan and was able to repeat key elements of the plan.   Addendum 09/25/2024: I received telephone message from pt today stating that he now has insurance and would like to move forward with getting advance imaging of his lower back. He had x-ray of lumbar spine done 08/17/22 that showed mild dextroscoliosis  of lumbar spine and DDD involving L2-S1. Will order MRI.  This documentation was completed using Paediatric nurse.  Any transcriptional errors are unintentional.  Orders Placed This Encounter  Procedures   CBC   Comprehensive metabolic panel with GFR   Lipid panel     Requested Prescriptions   Signed Prescriptions Disp Refills   methocarbamol  (ROBAXIN ) 500 MG tablet 30 tablet 1    Sig: Take 1 tablet by mouth every 8 hours as needed for muscle spasms.   carvedilol  (COREG ) 6.25 MG tablet 180 tablet 3    Sig: Take 1 tablet (6.25 mg total) by mouth 2 (two) times daily with a meal.   lisinopril -hydrochlorothiazide  (ZESTORETIC ) 20-12.5 MG tablet 180 tablet 3    Sig: Take 1 tablet by mouth 2 (two) times daily.   amLODipine  (NORVASC ) 5 MG tablet 90 tablet 1    Sig: Take 1 tablet (5 mg total) by mouth daily.   celecoxib   (CELEBREX ) 200 MG capsule 30 capsule 3    Sig: Take 1 capsule (200 mg total) by mouth daily.    Return in about 4 months (around 12/06/2024).  Barnie Louder, MD, FACP

## 2024-08-06 NOTE — Patient Instructions (Signed)
 VISIT SUMMARY:  Today, we reviewed your chronic medical conditions, including hypertension, insomnia, chronic back pain, and other health concerns. We discussed your recent scrape, blood pressure management, sleep issues, chronic pain, smoking habits, and general health maintenance.  YOUR PLAN:  -ESSENTIAL HYPERTENSION: Your blood pressure is still high despite your current medication. High blood pressure can lead to serious health problems if not managed well. We will add amlodipine  5 mg daily to your treatment and encourage you to reduce your salt intake.  -CHRONIC INSOMNIA: You are experiencing difficulty sleeping, which may be worsened by your back pain. Insomnia can affect your overall health and quality of life. Continue taking Ambien  5 mg as needed, and consider applying for Medicaid to help cover the cost of physical therapy.  -CHRONIC LOW BACK PAIN AND OSTEOARTHRITIS OF HANDS, HIPS, AND WRISTS: You have ongoing pain due to arthritis and muscle issues. This can limit your daily activities and comfort. We recommend applying for Medicaid to access physical therapy and imaging. We will prescribe Celebrex  for pain management and advise against using ibuprofen and Aleve .  -NICOTINE  DEPENDENCE: You continue to smoke, which is harmful to your health. Nicotine  dependence can lead to various health issues. We offer support for smoking cessation whenever you feel ready to quit.  -DEPRESSION, MILD AND RECURRENT: You have mild depression that varies in intensity. Depression can affect your mood and daily life. Currently, you do not feel the need for medication or counseling, which is acceptable.  -BENIGN PROSTATIC HYPERPLASIA: You have an enlarged prostate but no significant urinary issues at this time. This condition can cause urinary problems. Continue taking tamsulosin  as prescribed.  -CIRRHOSIS OF LIVER SECONDARY TO PRIOR HEPATITIS C: You have liver cirrhosis due to past hepatitis C infection.  Cirrhosis can affect liver function. You have chosen not to continue with liver ultrasounds, and we respect your decision.  -GENERAL HEALTH MAINTENANCE: You are due for a tetanus booster in September 2026. You declined the flu shot and Medicare wellness visit but are interested in the COVID-19 vaccine. We advise you to get the COVID-19 vaccine from an outside pharmacy.  INSTRUCTIONS:  Please follow up with annual blood tests for kidney and liver function, as well as cholesterol levels. We encourage you to apply for Medicaid to help with healthcare costs.

## 2024-08-07 ENCOUNTER — Ambulatory Visit: Payer: Self-pay | Admitting: Internal Medicine

## 2024-08-07 LAB — CBC
Hematocrit: 44 % (ref 37.5–51.0)
Hemoglobin: 15.1 g/dL (ref 13.0–17.7)
MCH: 31.9 pg (ref 26.6–33.0)
MCHC: 34.3 g/dL (ref 31.5–35.7)
MCV: 93 fL (ref 79–97)
Platelets: 198 x10E3/uL (ref 150–450)
RBC: 4.73 x10E6/uL (ref 4.14–5.80)
RDW: 12.7 % (ref 11.6–15.4)
WBC: 6.5 x10E3/uL (ref 3.4–10.8)

## 2024-08-07 LAB — COMPREHENSIVE METABOLIC PANEL WITH GFR
ALT: 13 IU/L (ref 0–44)
AST: 15 IU/L (ref 0–40)
Albumin: 4.4 g/dL (ref 3.8–4.8)
Alkaline Phosphatase: 65 IU/L (ref 44–121)
BUN/Creatinine Ratio: 25 — ABNORMAL HIGH (ref 10–24)
BUN: 29 mg/dL — ABNORMAL HIGH (ref 8–27)
Bilirubin Total: 0.7 mg/dL (ref 0.0–1.2)
CO2: 24 mmol/L (ref 20–29)
Calcium: 9.3 mg/dL (ref 8.6–10.2)
Chloride: 103 mmol/L (ref 96–106)
Creatinine, Ser: 1.17 mg/dL (ref 0.76–1.27)
Globulin, Total: 2.7 g/dL (ref 1.5–4.5)
Glucose: 88 mg/dL (ref 70–99)
Potassium: 3.7 mmol/L (ref 3.5–5.2)
Sodium: 140 mmol/L (ref 134–144)
Total Protein: 7.1 g/dL (ref 6.0–8.5)
eGFR: 66 mL/min/1.73 (ref >59)

## 2024-08-07 LAB — LIPID PANEL
Chol/HDL Ratio: 4 ratio (ref 0.0–5.0)
Cholesterol, Total: 174 mg/dL (ref 100–199)
HDL: 44 mg/dL (ref >39)
LDL Chol Calc (NIH): 98 mg/dL (ref 0–99)
Triglycerides: 183 mg/dL — ABNORMAL HIGH (ref 0–149)
VLDL Cholesterol Cal: 32 mg/dL (ref 5–40)

## 2024-08-20 ENCOUNTER — Telehealth: Payer: Self-pay | Admitting: Internal Medicine

## 2024-08-20 NOTE — Telephone Encounter (Signed)
 Pt came into the office this afternoon requesting to see if he can take 2 tablets every 12 hours  (Celebrex ) he's currently taking 1 tab

## 2024-08-21 NOTE — Telephone Encounter (Signed)
 Called but no answer. LVM to call back.

## 2024-08-21 NOTE — Telephone Encounter (Signed)
 Copied from CRM #[id number]. Topic: General - Other >> Aug 21, 2024  2:13 PM Zebedee SAUNDERS wrote:  Reason for CRM: Pt returning Alarcon, Clarisa N, CMA call regarding message which was provided. Pt did not have any questions.

## 2024-08-21 NOTE — Telephone Encounter (Signed)
 Noted! Thank you

## 2024-08-21 NOTE — Telephone Encounter (Signed)
 Higher dose frequency of Celebrex  carries the risk of adversely affecting the kidneys in older patients with long term use. Would advise taking Extra Strength Tylenol  2-3 times a day and continue the Celebrex  once a day.

## 2024-08-21 NOTE — Telephone Encounter (Signed)
 FYI

## 2024-08-29 ENCOUNTER — Other Ambulatory Visit: Payer: Self-pay | Admitting: Internal Medicine

## 2024-08-29 DIAGNOSIS — F5104 Psychophysiologic insomnia: Secondary | ICD-10-CM

## 2024-09-24 ENCOUNTER — Telehealth: Payer: Self-pay | Admitting: Internal Medicine

## 2024-09-24 ENCOUNTER — Ambulatory Visit: Payer: Self-pay

## 2024-09-24 NOTE — Telephone Encounter (Signed)
 Copied from CRM #[id number]. Topic: Clinical - Request for Lab/Test Order >> Sep 24, 2024 11:20 AM Wess RAMAN wrote: Reason for CRM: Patient state he now has healthcare coverage and would like Dr. Vicci to put in an order for a cat scan.  Callback #: [id number]

## 2024-09-24 NOTE — Telephone Encounter (Signed)
 FYI Only or Action Required?: Action required by provider: request for appointment, referral request, update on patient condition, and request for documentation or forms.  Patient was last seen in primary care on 08/06/2024 by Vicci Barnie NOVAK, MD.  Called Nurse Triage reporting No chief complaint on file..  Symptoms began several weeks ago.  Interventions attempted: Rest, hydration, or home remedies and Ice/heat application.  Symptoms are: unchanged.  Triage Disposition: See PCP When Office is Open (Within 3 Days)  Patient/caregiver understands and will follow disposition?: Yes  Copied from CRM #[id number]. Topic: Clinical - Red Word Triage >> Sep 24, 2024 11:22 AM Wess RAMAN wrote: Red Word that prompted transfer to Nurse Triage: Patient states his back has been out for 6 weeks. 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  Submitted order request for cat scan to office Reason for Disposition  [1] MODERATE back pain (e.g., interferes with normal activities) AND [2] present > 3 days  Answer Assessment - Initial Assessment Questions 1. ONSET: When did the pain begin? (e.g., minutes, hours, days)     6 Weeks  2. LOCATION: Where does it hurt? (upper, mid or lower back)     Generalized Back pain  3. SEVERITY: How bad is the pain?  (e.g., Scale 1-10; mild, moderate, or severe)     Moderate  4. PATTERN: Is the pain constant? (e.g., yes, no; constant, intermittent)      Constant  5. RADIATION: Does the pain shoot into your legs or somewhere else?     No  6. CAUSE:  What do you think is causing the back pain?      Unsure  7. BACK OVERUSE:  Any recent lifting of heavy objects, strenuous work or exercise?     No  8. MEDICINES: What have you taken so far for the pain? (e.g., nothing, acetaminophen , NSAIDS)     No  9. NEUROLOGIC SYMPTOMS: Do you have any weakness, numbness, or problems with bowel/bladder control?     No  10. OTHER SYMPTOMS: Do you have any other  symptoms? (e.g., fever, abdomen pain, burning with urination, blood in urine)       No  Protocols used: Back Pain-A-AH

## 2024-09-25 NOTE — Telephone Encounter (Signed)
 Order placed for MRI which would should better details than CT. No guarantee that insurance will agree to pay for it.

## 2024-09-25 NOTE — Addendum Note (Signed)
 Addended by: VICCI SOBER B on: 09/25/2024 01:15 PM   Modules accepted: Orders

## 2024-09-25 NOTE — Telephone Encounter (Signed)
 Called & spoke to the patient. Verified name & DOB. Inquired where the CAT scan for lower back pain. He stated that he experiencing back pain when standing, walking, and mainly when sitting. He stated that it is around the L-4 - L-5 area and would like a scan to see if there are any disc or musculoskeletal issues. Patient previously was unable to afford due to no insurance but has now been approved for Medicaid. Please advise.

## 2024-09-25 NOTE — Telephone Encounter (Signed)
 What is the CAT scan for?

## 2024-10-02 NOTE — Telephone Encounter (Signed)
 [facility] reached out to the patient and scheduled the MRI for 10/17/2024 for a MRI appointment.

## 2024-10-06 ENCOUNTER — Other Ambulatory Visit: Payer: Self-pay | Admitting: Internal Medicine

## 2024-10-06 DIAGNOSIS — N400 Enlarged prostate without lower urinary tract symptoms: Secondary | ICD-10-CM

## 2024-10-08 NOTE — Telephone Encounter (Signed)
 Requested Prescriptions  Refused Prescriptions Disp Refills   tamsulosin  (FLOMAX ) 0.4 MG CAPS capsule [Pharmacy Med Name: TAMSULOSIN  HYDROCHLORIDE 0.4 MG Oral Capsule] 180 capsule 3    Sig: TAKE 2 CAPSULES EVERY DAY AFTER SUPPER     Urology: Alpha-Adrenergic Blocker Failed - 10/08/2024 12:04 PM      Failed - Last BP in normal range    BP Readings from Last 1 Encounters:  08/06/24 (!) 145/82         Passed - PSA in normal range and within 360 days    PSA  Date Value Ref Range Status  01/20/2017 0.6 <=4.0 ng/mL Final    Comment:      The total PSA value from this assay system is standardized against the WHO standard. The test result will be approximately 20% lower when compared to the equimolar-standardized total PSA (Beck[employer] Coulter). Comparison of serial PSA results should be interpreted with this fact in mind.   This test was performed using the Siemens chemiluminescent method. Values obtained from different assay methods cannot be used interchangeably. PSA levels, regardless of value, should not be interpreted as absolute evidence of the presence or absence of disease.      Prostate Specific Ag, Serum  Date Value Ref Range Status  11/07/2023 1.3 0.0 - 4.0 ng/mL Final    Comment:    Roche ECLIA methodology. According to the American Urological Association, Serum PSA should decrease and remain at undetectable levels after radical prostatectomy. The AUA defines biochemical recurrence as an initial PSA value 0.2 ng/mL or greater followed by a subsequent confirmatory PSA value 0.2 ng/mL or greater. Values obtained with different assay methods or kits cannot be used interchangeably. Results cannot be interpreted as absolute evidence of the presence or absence of malignant disease.          Passed - Valid encounter within last 12 months    Recent Outpatient Visits           2 months ago Essential hypertension   [facility] [place]. [facility] Vicci Barnie NOVAK, MD   11 months ago Essential hypertension   [facility] [place]. [facility] Vicci Barnie NOVAK, MD   1 year ago Essential hypertension   [facility] Of [place]. [facility] Vicci Barnie NOVAK, MD   2 years ago Hypotension due to medication   [facility] Of [place]. [facility] Vicci Barnie NOVAK, MD   2 years ago Essential hypertension   [facility] Of . [facility] Vicci Barnie NOVAK, MD

## 2024-10-11 ENCOUNTER — Encounter: Payer: Self-pay | Admitting: Internal Medicine

## 2024-10-17 ENCOUNTER — Ambulatory Visit
Admission: RE | Admit: 2024-10-17 | Discharge: 2024-10-17 | Disposition: A | Source: Ambulatory Visit | Attending: Internal Medicine | Admitting: Internal Medicine

## 2024-10-17 DIAGNOSIS — M48061 Spinal stenosis, lumbar region without neurogenic claudication: Secondary | ICD-10-CM | POA: Diagnosis not present

## 2024-10-17 DIAGNOSIS — M545 Low back pain, unspecified: Secondary | ICD-10-CM

## 2024-10-29 ENCOUNTER — Ambulatory Visit: Attending: Family Medicine | Admitting: Family Medicine

## 2024-10-29 ENCOUNTER — Encounter: Payer: Self-pay | Admitting: Family Medicine

## 2024-10-29 VITALS — BP 125/85 | HR 72 | Temp 98.1°F | Ht 70.0 in | Wt 179.2 lb

## 2024-10-29 DIAGNOSIS — M51369 Other intervertebral disc degeneration, lumbar region without mention of lumbar back pain or lower extremity pain: Secondary | ICD-10-CM

## 2024-10-29 MED ORDER — DULOXETINE HCL 60 MG PO CPEP: 60.0000 mg | ORAL_CAPSULE | Freq: Every day | ORAL | 1 refills | Status: DC

## 2024-10-29 NOTE — Progress Notes (Signed)
 Subjective:  Patient ID: James Avila, male    DOB: 01-01-1951  Age: 73 y.o. MRN: [id number]  CC: Back Pain     Discussed the use of AI scribe software for clinical note transcription with the patient, who gave verbal consent to proceed.  History of Present Illness James Avila is a 73 year old male patient of Dr. Vicci with a history of hypertension, hepatitis C (achieved virologic cure), liver cirrhosis, BPH, nicotine  dependence who presents with acute exacerbation of chronic back pain.  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 He attributes ongoing pain to disc disease and chronic muscle strain.  Over the past week his low back pain became more severe and was constant and painful with lying, sitting, and standing, though it has improved somewhat by today. He describes pressure-like pain over the lumbar discs, worse with standing and especially sitting. He has started core strengthening exercises.  He is transitioning from Medicare to [facility] and is worried about insurance coverage for further treatment.  For pain he alternates ibuprofen and Celebrex  and uses methocarbamol  as needed, plus intermittent heating pad. Lying down provides the most relief, while sitting worsens pain.  He denies numbness in his feet, current sciatica, or shooting pain down his legs. He notes tightness when lifting his legs and tingling in his hands.  MRI lumbar spine from 09/2024 revealed:  IMPRESSION: 1. 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 2. L1-2: Broad-based disc bulging causing mild-to-moderate central spinal canal stenosis and bilateral lateral recess stenosis, with questionable impingement of the L2 nerves in the lateral recesses. 3. L2-3: Broad-based disc bulging with mild-to-moderate central spinal canal stenosis and  bilateral lateral recess stenosis, with questionable impingement of the left L3 nerve in the lateral recess. 4. L3-4: Broad-based disc bulging with moderate central spinal canal stenosis and bilateral lateral recess stenosis, with impingement of the L4 nerves in the lateral recesses. 5. L4-5: Diffuse disc bulging and moderate central spinal canal stenosis and bilateral lateral recess stenosis, with mild impingement of the L5 nerves in the lateral recesses. 6. L5-S1: Mild left-sided disc bulging with mild left lateral recess stenosis. No definite nerve root impingement.   Past Medical History:  Diagnosis Date   Anxiety state, unspecified 10/10/2013   BPH (benign prostatic hyperplasia) 10/10/2013   HEPATITIS C 05/10/2007   Qualifier: Diagnosis of  By: Tammie MD, Bruce     Hypertension    TOBACCO USE 09/07/2007   Qualifier: Diagnosis of  By: Tammie MD, Bruce      Past Surgical History:  Procedure Laterality Date   TONSILLECTOMY      Family History  Problem Relation Age of Onset   Hypertension Mother    Hydrocephalus Mother    Stroke Mother    Diabetes Mother    Cancer Father 84       skin     Social History   Socioeconomic History   Marital status: Divorced    Spouse name: Not on file   Number of children: Not on file   Years of education: Not on file   Highest education level: 12th grade  Occupational History   Not on file  Tobacco Use   Smoking status: Every Day    Current packs/day: 0.50    Types: Cigarettes   Smokeless tobacco: Never   Tobacco comments:  Smoking 5-10 cigs/day  Vaping Use   Vaping status: Never Used  Substance and Sexual Activity   Alcohol use: Yes    Alcohol/week: 4.0 standard drinks of alcohol    Types: 4 Cans of beer per week    Comment: weekly   Drug use: No   Sexual activity: Not on file  Other Topics Concern   Not on file  Social History Narrative   Not on file   Social Drivers of Health   Financial Resource Strain: Medium  Risk (10/27/2024)   Overall Financial Resource Strain (CARDIA)    Difficulty of Paying Living Expenses: Somewhat hard  Food Insecurity: Food Insecurity Present (10/27/2024)   Hunger Vital Sign    Worried About Running Out of Food in the Last Year: Sometimes true    Ran Out of Food in the Last Year: Never true  Transportation Needs: No Transportation Needs (10/27/2024)   PRAPARE - Administrator, Civil Service (Medical): No    Lack of Transportation (Non-Medical): No  Physical Activity: Unknown (10/27/2024)   Exercise Vital Sign    Days of Exercise per Week: Patient declined    Minutes of Exercise per Session: Not on file  Stress: Stress Concern Present (10/27/2024)   [employer] of Occupational Health - Occupational Stress Questionnaire    Feeling of Stress: Rather much  Social Connections: Socially Isolated (10/27/2024)   Social Connection and Isolation Panel    Frequency of Communication with Friends and Family: Twice a week    Frequency of Social Gatherings with Friends and Family: Twice a week    Attends Religious Services: Patient declined    Database Administrator or Organizations: No    Attends Engineer, Structural: Not on file    Marital Status: Divorced    Allergies  Allergen Reactions   Hydralazine      Causes numbness Increased blood pressure    Outpatient Medications Prior to Visit  Medication Sig Dispense Refill   acetaminophen  (TYLENOL ) 500 MG tablet Take 1 tablet (500 mg total) by mouth 2 (two) times daily as needed. 60 tablet 1   amLODipine  (NORVASC ) 5 MG tablet Take 1 tablet (5 mg total) by mouth daily. 90 tablet 1   aspirin  81 MG tablet Take 1 tablet (81 mg total) by mouth daily. 90 tablet 3   carvedilol  (COREG ) 6.25 MG tablet Take 1 tablet (6.25 mg total) by mouth 2 (two) times daily with a meal. 180 tablet 3   celecoxib  (CELEBREX ) 200 MG capsule Take 1 capsule (200 mg total) by mouth daily. 30 capsule 3   Cholecalciferol (VITAMIN  D3 PO) Take 5,000 mg by mouth daily.     lisinopril -hydrochlorothiazide  (ZESTORETIC ) 20-12.5 MG tablet Take 1 tablet by mouth 2 (two) times daily. 180 tablet 3   methocarbamol  (ROBAXIN ) 500 MG tablet Take 1 tablet by mouth every 8 hours as needed for muscle spasms. 30 tablet 1   sildenafil  (VIAGRA ) 50 MG tablet TAKE 1 TABLET BY MOUTH 30 MINUTES TO 1 HOUR BEFORE SEXUAL INTERCOURSE; LIMIT TO 1 TABLET PER 24 HOURS 10 tablet 0   tamsulosin  (FLOMAX ) 0.4 MG CAPS capsule TAKE 2 CAPSULES EVERY DAY AFTER SUPPER 180 capsule 3   zolpidem  (AMBIEN ) 5 MG tablet TAKE 1 TABLET BY MOUTH AT BEDTIME AS NEEDED 30 tablet 0   No facility-administered medications prior to visit.     ROS Review of Systems  Constitutional:  Negative for activity change and appetite change.  HENT:  Negative for sinus  pressure and sore throat.   Respiratory:  Negative for chest tightness, shortness of breath and wheezing.   Cardiovascular:  Negative for chest pain and palpitations.  Gastrointestinal:  Negative for abdominal distention, abdominal pain and constipation.  Genitourinary: Negative.   Musculoskeletal:        See HPI  Psychiatric/Behavioral:  Negative for behavioral problems and dysphoric mood.     Objective:  BP 125/85   Pulse 72   Temp 98.1 F (36.7 C) (Oral)   Ht 5' 10 (1.778 m)   Wt 179 lb 3.2 oz (81.3 kg)   SpO2 95%   BMI 25.71 kg/m      10/29/2024    2:23 PM 08/06/2024    2:27 PM 08/06/2024    2:00 PM  BP/Weight  Systolic BP 125 145 170  Diastolic BP 85 82 95  Wt. (Lbs) 179.2  183  BMI 25.71 kg/m2  26.26 kg/m2      Physical Exam Constitutional:      Appearance: He is well-developed.  Cardiovascular:     Rate and Rhythm: Normal rate.     Heart sounds: Normal heart sounds. No murmur heard. Pulmonary:     Effort: Pulmonary effort is normal.     Breath sounds: Normal breath sounds. No wheezing or rales.  Chest:     Chest wall: No tenderness.  Abdominal:     General: Bowel sounds are normal.  There is no distension.     Palpations: Abdomen is soft. There is no mass.     Tenderness: There is no abdominal tenderness.  Musculoskeletal:        General: No tenderness. Normal range of motion.     Right lower leg: No edema.     Left lower leg: No edema.     Comments: Negative straight leg raise bilaterally  Neurological:     Mental Status: He is alert and oriented to person, place, and time.     Gait: Gait normal.  Psychiatric:        Mood and Affect: Mood normal.        Latest Ref Rng & Units 08/06/2024    2:43 PM 05/04/2023    2:58 PM 09/17/2021    3:22 PM  CMP  Glucose 70 - 99 mg/dL 88  89  97   BUN 8 - 27 mg/dL 29  19  25    Creatinine 0.76 - 1.27 mg/dL 8.82  8.94  8.87   Sodium 134 - 144 mmol/L 140  141  140   Potassium 3.5 - 5.2 mmol/L 3.7  4.0  4.6   Chloride 96 - 106 mmol/L 103  101  101   CO2 20 - 29 mmol/L 24  27  25    Calcium 8.6 - 10.2 mg/dL 9.3  9.2  9.3   Total Protein 6.0 - 8.5 g/dL 7.1  7.1    Total Bilirubin 0.0 - 1.2 mg/dL 0.7  0.8    Alkaline Phos 44 - 121 IU/L 65  59    AST 0 - 40 IU/L 15  15    ALT 0 - 44 IU/L 13  18      Lipid Panel     Component Value Date/Time   CHOL 174 08/06/2024 1443   TRIG 183 (H) 08/06/2024 1443   HDL 44 08/06/2024 1443   CHOLHDL 4.0 08/06/2024 1443   CHOLHDL 5.0 10/08/2014 0912   VLDL 28 10/08/2014 0912   LDLCALC 98 08/06/2024 1443    CBC  Component Value Date/Time   WBC 6.5 08/06/2024 1443   WBC 7.6 01/20/2017 1216   RBC 4.73 08/06/2024 1443   RBC 4.73 01/20/2017 1216   HGB 15.1 08/06/2024 1443   HCT 44.0 08/06/2024 1443   PLT 198 08/06/2024 1443   MCV 93 08/06/2024 1443   MCH 31.9 08/06/2024 1443   MCH 30.7 01/20/2017 1216   MCHC 34.3 08/06/2024 1443   MCHC 34.2 01/20/2017 1216   RDW 12.7 08/06/2024 1443   LYMPHSABS 3.1 05/10/2018 1140   MONOABS 0.6 04/30/2014 1500   EOSABS 0.4 05/10/2018 1140   BASOSABS 0.0 05/10/2018 1140    Lab Results  Component Value Date   HGBA1C 5.4 10/10/2013        Assessment & Plan Bulging lumbar disc Chronic low back pain with lumbar disc disease and spinal stenosis, involving bulging discs at L1-S1 and spinal stenosis. Current management includes Celebrex  and methocarbamol . Considered duloxetine for additional pain relief. Surgery is a last resort per patient due to financial considerations. Physical therapy preferred as initial non-surgical intervention. - Referred to physical therapy for lumbar spine rehabilitation. - Prescribed duloxetine for chronic pain management. - Continue Celebrex  for arthritis and pain management. - Discontinued ibuprofen due to concurrent use with Celebrex . - Continue methocarbamol  as needed for muscle relaxation. - Advised use of heating pad for symptomatic relief. - Discussed potential referral to spine specialist for back injections if physical therapy is ineffective.      Meds ordered this encounter  Medications   DULoxetine (CYMBALTA) 60 MG capsule    Sig: Take 1 capsule (60 mg total) by mouth daily. For chronic pain    Dispense:  30 capsule    Refill:  1    Follow-up: Return for previously scheduled appointment.       Corrina Sabin, MD, FAAFP. [facility] [place], [place] [phone]   10/29/2024, 5:47 PM

## 2024-10-29 NOTE — Patient Instructions (Signed)
 VISIT SUMMARY:  You came in today because your chronic low back pain has worsened over the past week. 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  YOUR PLAN:  -CHRONIC LOW BACK PAIN WITH LUMBAR DISC DISEASE AND SPINAL STENOSIS: Chronic low back pain with lumbar disc disease and spinal stenosis means you have ongoing back pain due to bulging discs and narrowing of the spaces in your spine. We are starting you on duloxetine to help manage your pain. You should continue taking Celebrex  for arthritis and pain management, but stop taking ibuprofen since you are already on Celebrex . Continue using methocarbamol  as needed for muscle relaxation and use a heating pad for relief. You have been referred to physical therapy for lumbar spine rehabilitation. If physical therapy does not help, we may consider referring you to a spine specialist for back injections.  INSTRUCTIONS:  Please follow up with physical therapy as scheduled. Continue taking your medications as directed and use the heating pad for relief. If your pain does not improve with physical therapy, we will discuss the next steps, which may include seeing a spine specialist for possible back injections.

## 2024-11-03 ENCOUNTER — Other Ambulatory Visit: Payer: Self-pay | Admitting: Internal Medicine

## 2024-11-03 DIAGNOSIS — M25532 Pain in left wrist: Secondary | ICD-10-CM

## 2024-11-03 DIAGNOSIS — M2550 Pain in unspecified joint: Secondary | ICD-10-CM

## 2024-12-10 ENCOUNTER — Ambulatory Visit: Attending: Internal Medicine | Admitting: Internal Medicine

## 2024-12-10 ENCOUNTER — Encounter: Payer: Self-pay | Admitting: Internal Medicine

## 2024-12-10 VITALS — BP 126/71 | HR 67 | Temp 97.9°F | Ht 70.0 in | Wt 179.0 lb

## 2024-12-10 DIAGNOSIS — G8929 Other chronic pain: Secondary | ICD-10-CM | POA: Diagnosis not present

## 2024-12-10 DIAGNOSIS — Z79899 Other long term (current) drug therapy: Secondary | ICD-10-CM | POA: Diagnosis not present

## 2024-12-10 DIAGNOSIS — E559 Vitamin D deficiency, unspecified: Secondary | ICD-10-CM | POA: Insufficient documentation

## 2024-12-10 DIAGNOSIS — M65342 Trigger finger, left ring finger: Secondary | ICD-10-CM | POA: Insufficient documentation

## 2024-12-10 DIAGNOSIS — F17210 Nicotine dependence, cigarettes, uncomplicated: Secondary | ICD-10-CM | POA: Diagnosis not present

## 2024-12-10 DIAGNOSIS — M65331 Trigger finger, right middle finger: Secondary | ICD-10-CM | POA: Insufficient documentation

## 2024-12-10 DIAGNOSIS — M65341 Trigger finger, right ring finger: Secondary | ICD-10-CM | POA: Insufficient documentation

## 2024-12-10 DIAGNOSIS — Z23 Encounter for immunization: Secondary | ICD-10-CM

## 2024-12-10 DIAGNOSIS — Z8719 Personal history of other diseases of the digestive system: Secondary | ICD-10-CM

## 2024-12-10 DIAGNOSIS — G4700 Insomnia, unspecified: Secondary | ICD-10-CM | POA: Diagnosis not present

## 2024-12-10 DIAGNOSIS — M545 Low back pain, unspecified: Secondary | ICD-10-CM | POA: Diagnosis not present

## 2024-12-10 DIAGNOSIS — I10 Essential (primary) hypertension: Secondary | ICD-10-CM | POA: Insufficient documentation

## 2024-12-10 DIAGNOSIS — Z789 Other specified health status: Secondary | ICD-10-CM

## 2024-12-10 DIAGNOSIS — F410 Panic disorder [episodic paroxysmal anxiety] without agoraphobia: Secondary | ICD-10-CM | POA: Insufficient documentation

## 2024-12-10 DIAGNOSIS — Z1331 Encounter for screening for depression: Secondary | ICD-10-CM

## 2024-12-10 DIAGNOSIS — M65332 Trigger finger, left middle finger: Secondary | ICD-10-CM | POA: Insufficient documentation

## 2024-12-10 DIAGNOSIS — F17200 Nicotine dependence, unspecified, uncomplicated: Secondary | ICD-10-CM

## 2024-12-10 MED ORDER — VARENICLINE TARTRATE (STARTER) 0.5 MG X 11 & 1 MG X 42 PO TBPK: ORAL_TABLET | ORAL | 0 refills | Status: AC

## 2024-12-10 MED ORDER — AMLODIPINE BESYLATE 5 MG PO TABS: 5.0000 mg | ORAL_TABLET | Freq: Every day | ORAL | 1 refills | Status: AC

## 2024-12-10 MED ORDER — VARENICLINE TARTRATE 1 MG PO TABS: 1.0000 mg | ORAL_TABLET | Freq: Two times a day (BID) | ORAL | 1 refills | Status: AC

## 2024-12-10 NOTE — Progress Notes (Signed)
 "   Patient ID: James Avila, male    DOB: [birth year]  MRN: [id number]  CC: Hypertension (HTN f/u. Suellen stopping Cymbalta  due to feeling like dream-like state, loss of appetite with unintentional weight loss. /Flu vax administered on 12/10/24 - C.A.)  Subjective: James Avila is a 74 y.o. male who presents for chronic ds management. 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   Discussed the use of AI scribe software for clinical note transcription with the patient, who gave verbal consent to proceed.  History of Present Illness James Avila is a 74 year old male with hypertension and chronic back pain who presents for a follow-up visit.  He has chronic back pain due to spinal stenosis and bulging discs, confirmed by last fall MRI. He experienced a recent flare-up and was seen by Dr. Newlin last mth. Cymbalta  60 mg was added to Celebrex  and he was referred for physical therapy, but delayed starting due to [facility] application issues. He is now qualified for Medicaid and is interested in pursuing physical therapy. He is currently taking Celebrex  for joint pain, which he finds effective. He stopped Cymbalta  after 4 doses because it had him feeling a 'dream-like state' and significant appetite loss, leading to a 10-pound weight loss.   He experiences trigger finger in both hands, affecting the middle and ring fingers, with symptoms varying daily. He uses methocarbamol  as needed for muscle relaxation and is considering future orthopedic consultation if symptoms persist.  HTN: On last visit, we added Norvasc  5 mg daily to the lisinopril /HCTZ and carvedilol .  He reports taking all 3 medications with no complications.  He has blood pressure log with him.  Some of his recent readings were 119/74, 103/72, 113/70.  No chest pains or shortness of breath.     He has a history of hepatitis C, which was cured, and cirrhosis. He declines further liver ultrasounds for [facility] monitoring citing no current liver pain. LFTs have been nl.  He is a smoker but wants to quit and is considering medication to assist with cessation.  He reports a stable weight of 170 pounds, which is consistent with his typical weight range. He has a history of elevated triglycerides, attributed to dietary habits.  On last visit, he reported history of mild depression that can vary in intensity but did not feel the need for medication or counseling.  PHQ score today has improved.  Again today he reports that he has mild depression here and there depending on what is going on in life.  Still does not feel that he needs any medication or counseling at this time.    Patient Active Problem List   Diagnosis Date Noted   Colon cancer screening declined 07/17/2020   Lung cancer screening declined by patient 07/17/2020   Headache syndrome 03/19/2020   Hepatitis C virus infection cured after antiviral drug therapy 02/05/2019   Panic disorder 02/05/2019   Nodule of shaft of penis 06/02/2017   Seborrheic keratoses 05/02/2017   Insomnia 04/06/2017   Plantar fasciitis, left 03/06/2017   Deltoid tendonitis, unspecified laterality 01/20/2017   Impotence 04/11/2016   Plantar wart of left foot 04/11/2016   Vitamin D  deficiency 04/08/2016   Hepatic cirrhosis (HCC) 07/31/2014   BPH (benign prostatic hyperplasia) 10/10/2013   Anxiety state 10/10/2013   Chronic back pain 10/10/2013  TOBACCO USE 09/07/2007   Hepatitis C virus infection without hepatic coma 05/10/2007   Essential hypertension 05/10/2007     Medications Ordered Prior to Encounter[1]  Allergies[2]  Social History   Socioeconomic History   Marital status: Divorced    Spouse name: Not on file   Number of children: Not on file   Years of education: Not on file   Highest education level: 12th grade  Occupational  History   Not on file  Tobacco Use   Smoking status: Every Day    Current packs/day: 0.50    Types: Cigarettes   Smokeless tobacco: Never   Tobacco comments:    Smoking 5-10 cigs/day  Vaping Use   Vaping status: Never Used  Substance and Sexual Activity   Alcohol use: Yes    Alcohol/week: 4.0 standard drinks of alcohol    Types: 4 Cans of beer per week    Comment: weekly   Drug use: No   Sexual activity: Not on file  Other Topics Concern   Not on file  Social History Narrative   Not on file   Social Drivers of Health   Tobacco Use: High Risk (12/10/2024)   Patient History    Smoking Tobacco Use: Every Day    Smokeless Tobacco Use: Never    Passive Exposure: Not on file  Financial Resource Strain: Medium Risk (10/27/2024)   Overall Financial Resource Strain (CARDIA)    Difficulty of Paying Living Expenses: Somewhat hard  Food Insecurity: Food Insecurity Present (10/27/2024)   Epic    Worried About Programme Researcher, Broadcasting/film/video in the Last Year: Sometimes true    Ran Out of Food in the Last Year: Never true  Transportation Needs: No Transportation Needs (10/27/2024)   Epic    Lack of Transportation (Medical): No    Lack of Transportation (Non-Medical): No  Physical Activity: Unknown (10/27/2024)   Exercise Vital Sign    Days of Exercise per Week: Patient declined    Minutes of Exercise per Session: Not on file  Stress: Stress Concern Present (10/27/2024)   [employer] of Occupational Health - Occupational Stress Questionnaire    Feeling of Stress: Rather much  Social Connections: Socially Isolated (10/27/2024)   Social Connection and Isolation Panel    Frequency of Communication with Friends and Family: Twice a week    Frequency of Social Gatherings with Friends and Family: Twice a week    Attends Religious Services: Patient declined    Database Administrator or Organizations: No    Attends Engineer, Structural: Not on file    Marital Status: Divorced   Intimate Partner Violence: Not At Risk (11/07/2023)   Humiliation, Afraid, Rape, and Kick questionnaire    Fear of Current or Ex-Partner: No    Emotionally Abused: No    Physically Abused: No    Sexually Abused: No  Depression (PHQ2-9): Medium Risk (12/10/2024)   Depression (PHQ2-9)    PHQ-2 Score: 5  Alcohol Screen: Low Risk (10/27/2024)   Alcohol Screen    Last Alcohol Screening Score (AUDIT): 3  Housing: High Risk (10/27/2024)   Epic    Unable to Pay for Housing in the Last Year: Yes    Number of Times Moved in the Last Year: 1    Homeless in the Last Year: No  Utilities: Not At Risk (11/07/2023)   AHC Utilities    Threatened with loss of utilities: No  Health Literacy: Adequate Health Literacy (11/07/2023)   B1300 Health Literacy  Frequency of need for help with medical instructions: Never    Family History  Problem Relation Age of Onset   Hypertension Mother    Hydrocephalus Mother    Stroke Mother    Diabetes Mother    Cancer Father 15       skin     Past Surgical History:  Procedure Laterality Date   TONSILLECTOMY      ROS: Review of Systems Negative except as stated above  PHYSICAL EXAM: BP 126/71 (BP Location: Left Arm, Patient Position: Sitting, Cuff Size: Normal)   Pulse 67   Temp 97.9 F (36.6 C) (Oral)   Ht 5' 10 (1.778 m)   Wt 179 lb (81.2 kg)   SpO2 97%   BMI 25.68 kg/m   Wt Readings from Last 3 Encounters:  12/10/24 179 lb (81.2 kg)  10/29/24 179 lb 3.2 oz (81.3 kg)  08/06/24 183 lb (83 kg)    Physical Exam General appearance - alert, well appearing, older caucasian male and in no distress Mental status - normal mood, behavior, speech, dress, motor activity, and thought processes Neck - supple, no significant adenopathy Chest - clear to auscultation, no wheezes, rales or rhonchi, symmetric air entry Heart - normal rate, regular rhythm, normal S1, S2, no murmurs, rubs, clicks or gallops Abdomen: soft, nontender, no  organomegaly Extremities - peripheral pulses normal, no pedal edema, no clubbing or cyanosis     12/10/2024    1:46 PM 08/06/2024    2:08 PM 11/07/2023    1:59 PM  Depression screen PHQ 2/9  Decreased Interest 1 1 1   Down, Depressed, Hopeless 1 1 1   PHQ - 2 Score 2 2 2   Altered sleeping 0 2 2  Tired, decreased energy 1 1 0  Change in appetite 0 0 0  Feeling bad or failure about yourself  1 1 1   Trouble concentrating 1 1 2   Moving slowly or fidgety/restless 0 1 0  Suicidal thoughts 0 0 0  PHQ-9 Score 5 8  7    Difficult doing work/chores Not difficult at all Somewhat difficult Somewhat difficult     Data saved with a previous flowsheet row definition      12/10/2024    1:46 PM 08/06/2024    2:09 PM 11/07/2023    1:59 PM 05/04/2023    2:04 PM  GAD 7 : Generalized Anxiety Score  Nervous, Anxious, on Edge 0 2 1 1   Control/stop worrying 1 1 1 1   Worry too much - different things 1 1 1 1   Trouble relaxing 1 2 2 1   Restless 0 2 1 1   Easily annoyed or irritable 0 1 1 1   Afraid - awful might happen 1 1 1 2   Total GAD 7 Score 4 10 8 8   Anxiety Difficulty Not difficult at all Somewhat difficult Somewhat difficult          Latest Ref Rng & Units 08/06/2024    2:43 PM 05/04/2023    2:58 PM 09/17/2021    3:22 PM  CMP  Glucose 70 - 99 mg/dL 88  89  97   BUN 8 - 27 mg/dL 29  19  25    Creatinine 0.76 - 1.27 mg/dL 8.82  8.94  8.87   Sodium 134 - 144 mmol/L 140  141  140   Potassium 3.5 - 5.2 mmol/L 3.7  4.0  4.6   Chloride 96 - 106 mmol/L 103  101  101   CO2 20 - 29 mmol/L 24  27  25   Calcium 8.6 - 10.2 mg/dL 9.3  9.2  9.3   Total Protein 6.0 - 8.5 g/dL 7.1  7.1    Total Bilirubin 0.0 - 1.2 mg/dL 0.7  0.8    Alkaline Phos 44 - 121 IU/L 65  59    AST 0 - 40 IU/L 15  15    ALT 0 - 44 IU/L 13  18     Lipid Panel     Component Value Date/Time   CHOL 174 08/06/2024 1443   TRIG 183 (H) 08/06/2024 1443   HDL 44 08/06/2024 1443   CHOLHDL 4.0 08/06/2024 1443   CHOLHDL 5.0 10/08/2014  0912   VLDL 28 10/08/2014 0912   LDLCALC 98 08/06/2024 1443    CBC    Component Value Date/Time   WBC 6.5 08/06/2024 1443   WBC 7.6 01/20/2017 1216   RBC 4.73 08/06/2024 1443   RBC 4.73 01/20/2017 1216   HGB 15.1 08/06/2024 1443   HCT 44.0 08/06/2024 1443   PLT 198 08/06/2024 1443   MCV 93 08/06/2024 1443   MCH 31.9 08/06/2024 1443   MCH 30.7 01/20/2017 1216   MCHC 34.3 08/06/2024 1443   MCHC 34.2 01/20/2017 1216   RDW 12.7 08/06/2024 1443   LYMPHSABS 3.1 05/10/2018 1140   MONOABS 0.6 04/30/2014 1500   EOSABS 0.4 05/10/2018 1140   BASOSABS 0.0 05/10/2018 1140    ASSESSMENT AND PLAN: 1. Essential hypertension (Primary) At goal.  Continue lisinopril /HCTZ 20/12.5 mg twice a day, carvedilol  6.25 mg twice a day and Norvasc  5 mg daily. - amLODipine  (NORVASC ) 5 MG tablet; Take 1 tablet (5 mg total) by mouth daily.  Dispense: 90 tablet; Refill: 1  2. Chronic bilateral low back pain without sciatica Patient does not want to see a neurosurgeon as he would not consider any surgical options.  He would like to start with physical therapy.  Referral submitted.  Continue Celebrex .  He will stop Cymbalta . - Ambulatory referral to Physical Therapy  3. Trigger finger of both middle fingers 4. Trigger finger of both ring fingers Patient will continue to use Robaxin  as needed.  He defers on referral to orthopedics at this time  5. Tobacco dependence Pt is current smoker. Patient advised to quit smoking. He is aware of health risk associated with smoking Pt ready to give trail of quitting.   Discussed methods to help quit including quitting cold turkey, use of NRT, Chantix  and Bupropion.  Pt wanting to try: Chantix .  Went over with him how the medication works.  Advised that the medication can cause vivid dreams.  Patient states he has them anyway and is not concerned about that.  Went over with him the starter pack to be taken first followed by continuation pack for a few months.  All  questions were answered. _3_ Minutes spent on counseling. F/U: Reassess progress on next visit.  - Varenicline  Tartrate, Starter, (CHANTIX  STARTING MONTH PAK) 0.5 MG X 11 & 1 MG X 42 TBPK; 0.5 mg PO daily day 1-3, then BID day 4-7 then 1 tab BID  Dispense: 53 each; Refill: 0 - varenicline  (CHANTIX  CONTINUING MONTH PAK) 1 MG tablet; Take 1 tablet (1 mg total) by mouth 2 (two) times daily.  Dispense: 60 tablet; Refill: 1  6. History of cirrhosis of liver Patient not wanting ultrasound for monitoring at this time.  From a clinical standpoint he is asymptomatic without any ascites.  LFTs have been normal.  7. Medication intolerance Cymbalta   discontinued  8. Positive depression screening Patient reports this is not a major issue for him.  9. Need for immunization against influenza - Flu vaccine HIGH DOSE PF(Fluzone Trivalent)    Patient was given the opportunity to ask questions.  Patient verbalized understanding of the plan and was able to repeat key elements of the plan.   This documentation was completed using Paediatric nurse.  Any transcriptional errors are unintentional.  Orders Placed This Encounter  Procedures   Flu vaccine HIGH DOSE PF(Fluzone Trivalent)   Ambulatory referral to Physical Therapy     Requested Prescriptions   Signed Prescriptions Disp Refills   amLODipine  (NORVASC ) 5 MG tablet 90 tablet 1    Sig: Take 1 tablet (5 mg total) by mouth daily.   Varenicline  Tartrate, Starter, (CHANTIX  STARTING MONTH PAK) 0.5 MG X 11 & 1 MG X 42 TBPK 53 each 0    Sig: 0.5 mg PO daily day 1-3, then BID day 4-7 then 1 tab BID   varenicline  (CHANTIX  CONTINUING MONTH PAK) 1 MG tablet 60 tablet 1    Sig: Take 1 tablet (1 mg total) by mouth 2 (two) times daily.    Return in about 4 months (around 04/09/2025).  Barnie Louder, MD, FACP     [1]  Current Outpatient Medications on File Prior to Visit  Medication Sig Dispense Refill   acetaminophen  (TYLENOL )  500 MG tablet Take 1 tablet (500 mg total) by mouth 2 (two) times daily as needed. 60 tablet 1   aspirin  81 MG tablet Take 1 tablet (81 mg total) by mouth daily. 90 tablet 3   carvedilol  (COREG ) 6.25 MG tablet Take 1 tablet (6.25 mg total) by mouth 2 (two) times daily with a meal. 180 tablet 3   celecoxib  (CELEBREX ) 200 MG capsule TAKE 1 CAPSULE EVERY DAY 90 capsule 3   Cholecalciferol (VITAMIN D3 PO) Take 5,000 mg by mouth daily.     lisinopril -hydrochlorothiazide  (ZESTORETIC ) 20-12.5 MG tablet Take 1 tablet by mouth 2 (two) times daily. 180 tablet 3   methocarbamol  (ROBAXIN ) 500 MG tablet Take 1 tablet by mouth every 8 hours as needed for muscle spasms. 30 tablet 1   sildenafil  (VIAGRA ) 50 MG tablet TAKE 1 TABLET BY MOUTH 30 MINUTES TO 1 HOUR BEFORE SEXUAL INTERCOURSE; LIMIT TO 1 TABLET PER 24 HOURS 10 tablet 0   tamsulosin  (FLOMAX ) 0.4 MG CAPS capsule TAKE 2 CAPSULES EVERY DAY AFTER SUPPER 180 capsule 3   zolpidem  (AMBIEN ) 5 MG tablet TAKE 1 TABLET BY MOUTH AT BEDTIME AS NEEDED 30 tablet 0   No current facility-administered medications on file prior to visit.  [2]  Allergies Allergen Reactions   Hydralazine      Causes numbness Increased blood pressure   "

## 2024-12-10 NOTE — Patient Instructions (Signed)
" °  VISIT SUMMARY: During your follow-up visit, we discussed your chronic back pain, trigger finger, hypertension, smoking cessation, and liver health. You are now eligible for Medicaid, which will help you access physical therapy for your back pain. We also reviewed your current medications and discussed your interest in quitting smoking.  YOUR PLAN: -LUMBAR SPINAL STENOSIS WITH CHRONIC LOW BACK PAIN: Lumbar spinal stenosis is a condition where the spaces within your spine narrow, which can put pressure on the nerves that travel through the spine. You have chronic low back pain due to this condition and a bulging disc. We have submitted a referral for physical therapy at [facility], and you should continue taking Celebrex  for joint pain management.  -BILATERAL TRIGGER FINGER OF MIDDLE AND RING FINGERS: Trigger finger is a condition where your fingers get stuck in a bent position and may straighten with a snap. You experience this in both hands, affecting your middle and ring fingers. You should continue using methocarbamol  as needed for muscle relaxation. If symptoms worsen, consider an orthopedic consultation for potential injections or surgery.  -ESSENTIAL HYPERTENSION: Hypertension, or high blood pressure, is a condition where the force of the blood against your artery walls is too high. Your blood pressure is well-controlled with your current medications. Continue taking lisinopril  hydrochlorothiazide , carvedilol , and amlodipine  as prescribed.  -TOBACCO DEPENDENCE: Tobacco dependence is an addiction to tobacco products. You have expressed a readiness to quit smoking and are interested in using medication to help. We have prescribed a Chantix  starter pack and continuation pack to assist with smoking cessation.  -CIRRHOSIS OF LIVER DUE TO PRIOR HEPATITIS C: Cirrhosis is a late stage of scarring (fibrosis) of the liver caused by many forms of liver diseases and conditions, such as hepatitis. Your  cirrhosis is secondary to cured hepatitis C, and your liver enzymes are normal. You have declined further liver ultrasounds, so we will continue to monitor your liver function tests as needed.  INSTRUCTIONS: Please follow up with physical therapy at [facility] as soon as possible. Continue taking your current medications as prescribed. Start the Chantix  starter pack for smoking cessation and follow the instructions provided. If your trigger finger symptoms worsen, consider scheduling an orthopedic consultation. We will continue to monitor your liver function tests as needed.                      Contains text generated by [employer]bridge.                                 Contains text generated by Abridge.   "

## 2024-12-24 ENCOUNTER — Other Ambulatory Visit: Payer: Self-pay | Admitting: Internal Medicine

## 2024-12-24 DIAGNOSIS — M545 Low back pain, unspecified: Secondary | ICD-10-CM

## 2024-12-25 ENCOUNTER — Other Ambulatory Visit: Payer: Self-pay | Admitting: Internal Medicine

## 2024-12-25 DIAGNOSIS — F5104 Psychophysiologic insomnia: Secondary | ICD-10-CM

## 2024-12-25 NOTE — Telephone Encounter (Unsigned)
 Copied from CRM [id number]. Topic: Clinical - Medication Refill >> Dec 25, 2024  2:16 PM Selinda RAMAN wrote: Medication: zolpidem  (AMBIEN ) 5 MG tablet  Has the patient contacted their pharmacy? Yes  This is the patient's preferred pharmacy:  [facility] [id number] - [place], [place] - [address] [place] [address] Phone: [phone] Fax: [phone]    Is this the correct pharmacy for this prescription? Yes If no, delete pharmacy and type the correct one.   Has the prescription been filled recently? No  Is the patient out of the medication? No but he only has 3 left  Has the patient been seen for an appointment in the last year OR does the patient have an upcoming appointment? Yes  Can we respond through MyChart? Yes  Please assist patient further

## 2024-12-26 MED ORDER — ZOLPIDEM TARTRATE 5 MG PO TABS: 5.0000 mg | ORAL_TABLET | Freq: Every evening | ORAL | 1 refills | Status: AC | PRN

## 2024-12-26 NOTE — Telephone Encounter (Signed)
 Requested medications are due for refill today.  yes  Requested medications are on the active medications list.  yes  Last refill. 08/30/2024 #30 0 rf  Future visit scheduled.   yes  Notes to clinic.  Refill not delegated.    Requested Prescriptions  Pending Prescriptions Disp Refills   zolpidem  (AMBIEN ) 5 MG tablet 30 tablet 0    Sig: Take 1 tablet (5 mg total) by mouth at bedtime as needed.     Not Delegated - Psychiatry:  Anxiolytics/Hypnotics Failed - 12/26/2024  2:15 PM      Failed - This refill cannot be delegated      Failed - Urine Drug Screen completed in last 360 days      Passed - Valid encounter within last 6 months    Recent Outpatient Visits           2 weeks ago Essential hypertension   [facility] Of [place]. [facility] Barnie NOVAK, MD   1 month ago Bulging lumbar disc   [facility] - [facility] Of [place]. [facility] Delbert Clam, MD   4 months ago Essential hypertension   [facility] Of [place]. [facility] Barnie NOVAK, MD   1 year ago Essential hypertension   [facility] Dept Of [place]. [facility] Barnie NOVAK, MD   1 year ago Essential hypertension    [facility] Of . [facility] Barnie NOVAK, MD

## 2025-04-10 ENCOUNTER — Ambulatory Visit: Payer: Self-pay | Admitting: Internal Medicine
